# Patient Record
Sex: Male | Born: 1945 | Race: Black or African American | Hispanic: No | Marital: Married | Smoking: Former smoker
Health system: Southern US, Community
[De-identification: ages and names within clinical notes are randomized; demographics above are authoritative.]

## PROBLEM LIST (undated history)

## (undated) DIAGNOSIS — I10 Essential (primary) hypertension: Secondary | ICD-10-CM

## (undated) DIAGNOSIS — Z8619 Personal history of other infectious and parasitic diseases: Secondary | ICD-10-CM

## (undated) DIAGNOSIS — G20 Parkinson's disease: Secondary | ICD-10-CM

## (undated) DIAGNOSIS — N529 Male erectile dysfunction, unspecified: Secondary | ICD-10-CM

## (undated) DIAGNOSIS — K5900 Constipation, unspecified: Secondary | ICD-10-CM

## (undated) DIAGNOSIS — N400 Enlarged prostate without lower urinary tract symptoms: Secondary | ICD-10-CM

## (undated) HISTORY — DX: Personal history of other infectious and parasitic diseases: Z86.19

## (undated) HISTORY — PX: MOLE REMOVAL: SHX2046

## (undated) HISTORY — DX: Essential (primary) hypertension: I10

## (undated) HISTORY — DX: Parkinson's disease: G20

## (undated) HISTORY — DX: Male erectile dysfunction, unspecified: N52.9

---

## 2005-06-11 ENCOUNTER — Ambulatory Visit (HOSPITAL_COMMUNITY): Admission: RE | Admit: 2005-06-11 | Discharge: 2005-06-11 | Payer: Self-pay | Admitting: Family Medicine

## 2006-03-24 ENCOUNTER — Ambulatory Visit: Payer: Self-pay | Admitting: Family Medicine

## 2006-03-29 ENCOUNTER — Ambulatory Visit: Payer: Self-pay | Admitting: Family Medicine

## 2006-05-16 ENCOUNTER — Ambulatory Visit: Payer: Self-pay | Admitting: Family Medicine

## 2007-07-20 HISTORY — PX: OTHER SURGICAL HISTORY: SHX169

## 2009-08-05 ENCOUNTER — Emergency Department: Payer: Self-pay | Admitting: Emergency Medicine

## 2011-02-12 ENCOUNTER — Other Ambulatory Visit: Payer: Self-pay

## 2011-02-19 ENCOUNTER — Encounter: Payer: Self-pay | Admitting: Family Medicine

## 2011-06-18 ENCOUNTER — Encounter: Payer: Self-pay | Admitting: Family Medicine

## 2011-06-18 ENCOUNTER — Ambulatory Visit (INDEPENDENT_AMBULATORY_CARE_PROVIDER_SITE_OTHER): Payer: Self-pay | Admitting: Family Medicine

## 2011-06-18 VITALS — BP 142/92 | HR 72 | Temp 98.5°F | Ht 68.0 in | Wt 186.5 lb

## 2011-06-18 DIAGNOSIS — Z1211 Encounter for screening for malignant neoplasm of colon: Secondary | ICD-10-CM

## 2011-06-18 DIAGNOSIS — N529 Male erectile dysfunction, unspecified: Secondary | ICD-10-CM

## 2011-06-18 DIAGNOSIS — Z0000 Encounter for general adult medical examination without abnormal findings: Secondary | ICD-10-CM | POA: Insufficient documentation

## 2011-06-18 DIAGNOSIS — H938X9 Other specified disorders of ear, unspecified ear: Secondary | ICD-10-CM

## 2011-06-18 DIAGNOSIS — Z125 Encounter for screening for malignant neoplasm of prostate: Secondary | ICD-10-CM

## 2011-06-18 LAB — COMPREHENSIVE METABOLIC PANEL
ALT: 15 U/L (ref 0–53)
AST: 18 U/L (ref 0–37)
Albumin: 4 g/dL (ref 3.5–5.2)
Alkaline Phosphatase: 51 U/L (ref 39–117)
BUN: 15 mg/dL (ref 6–23)
Calcium: 9.4 mg/dL (ref 8.4–10.5)
Chloride: 106 mEq/L (ref 96–112)
GFR: 90.47 mL/min (ref >60.00)
Glucose, Bld: 107 mg/dL — ABNORMAL HIGH (ref 70–99)
Potassium: 4 mEq/L (ref 3.5–5.1)
Sodium: 140 mEq/L (ref 135–145)
Total Bilirubin: 1.3 mg/dL — ABNORMAL HIGH (ref 0.3–1.2)

## 2011-06-18 LAB — CBC WITH DIFFERENTIAL/PLATELET
Basophils Relative: 0.3 % (ref 0.0–3.0)
Eosinophils Absolute: 0.1 10*3/uL (ref 0.0–0.7)
Eosinophils Relative: 1 % (ref 0.0–5.0)
HCT: 42.4 % (ref 39.0–52.0)
Hemoglobin: 14 g/dL (ref 13.0–17.0)
Lymphocytes Relative: 39.6 % (ref 12.0–46.0)
Lymphs Abs: 2.3 10*3/uL (ref 0.7–4.0)
MCHC: 33.1 g/dL (ref 30.0–36.0)
MCV: 86.9 fl (ref 78.0–100.0)
Monocytes Absolute: 0.5 10*3/uL (ref 0.1–1.0)
Monocytes Relative: 7.8 % (ref 3.0–12.0)
Neutro Abs: 3 10*3/uL (ref 1.4–7.7)
Neutrophils Relative %: 51.3 % (ref 43.0–77.0)
RBC: 4.88 Mil/uL (ref 4.22–5.81)
WBC: 5.9 10*3/uL (ref 4.5–10.5)

## 2011-06-18 LAB — LIPID PANEL
Cholesterol: 183 mg/dL (ref 0–200)
HDL: 53.5 mg/dL (ref >39.00)
LDL Cholesterol: 115 mg/dL — ABNORMAL HIGH (ref 0–99)
Total CHOL/HDL Ratio: 3
VLDL: 14.6 mg/dL (ref 0.0–40.0)

## 2011-06-18 LAB — PSA: PSA: 2.51 ng/mL (ref 0.10–4.00)

## 2011-06-18 LAB — POC HEMOCCULT BLD/STL (OFFICE/1-CARD/DIAGNOSTIC): Fecal Occult Blood, POC: NEGATIVE

## 2011-06-18 MED ORDER — TADALAFIL 10 MG PO TABS: 10.0000 mg | ORAL_TABLET | Freq: Every day | ORAL | Status: AC | PRN

## 2011-06-18 NOTE — Assessment & Plan Note (Signed)
Preventative protocols reviewed and updated unless pt declined. Declines flu.  Last tetanus 2008. Would like stool kit for colon screening. PSA today.  DRE reassuring.

## 2011-06-18 NOTE — Assessment & Plan Note (Signed)
Requests refill of cialis.  Has taken and tolerated in past. Reviewed side effects, states familiar with them.

## 2011-06-18 NOTE — Progress Notes (Signed)
Subjective:    Patient ID: Brandon Manning, male    DOB: 04-Nov-1945, 65 y.o.   MRN: [id number]  HPI CC: re establish, would like CPE.  Presents with wife.  No concerns today.  Would like physical for new insurance purposes  Wants ear checked, may have cotton tip of qtip in right ear.  Happened about 1 mo ago.  Some muffled hearing on right side.  Having problems with sex - maintaining erection.  Drive ok.  cialis has worked better than viagra in past.  Would like refill.  Preventative: Last CPE 2007.  Had blood work done at work in past, states no problems. Last tetanus shot 2008. Declines flu shot. Colon screening - would like stool kit. Prostate screening - no prostate exam in past.  Would like today.  Nocturia x1, strong stream. Fasting today  Caffeine: occasional caffeine Lives with wife, 38yo son Occupation: works at [employer] Activity: walking at [employer], working outside Diet: fruits/vegetables daily, water, occasional red meat, fish 1x/wk  Medications and allergies reviewed and updated in chart.  Past histories reviewed and updated if relevant as below. Patient Active Problem List  Diagnoses  . Health maintenance examination   Past Medical History  Diagnosis Date  . History of chicken pox    No past surgical history on file. History  Substance Use Topics  . Smoking status: Never Smoker   . Smokeless tobacco: Never Used  . Alcohol Use: No   Family History  Problem Relation Age of Onset  . Kidney disease Mother   . Diabetes Sister   . Cancer Neg Hx   . Coronary artery disease Neg Hx   . Stroke Neg Hx   . Hyperlipidemia Neg Hx   . Hypertension Neg Hx    No Known Allergies No current outpatient prescriptions on file prior to visit.   Review of Systems  Constitutional: Negative for fever, chills, activity change, appetite change, fatigue and unexpected weight change.  HENT: Negative for hearing loss and neck pain.   Eyes: Negative for visual  disturbance.  Respiratory: Negative for cough, chest tightness, shortness of breath and wheezing.   Cardiovascular: Negative for chest pain, palpitations and leg swelling.  Gastrointestinal: Negative for nausea, vomiting, abdominal pain, diarrhea, constipation, blood in stool and abdominal distention.  Genitourinary: Negative for hematuria and difficulty urinating.  Musculoskeletal: Negative for myalgias and arthralgias.  Skin: Negative for rash.  Neurological: Negative for dizziness, seizures, syncope and headaches.  Hematological: Does not bruise/bleed easily.  Psychiatric/Behavioral: Negative for dysphoric mood. The patient is not nervous/anxious.        Objective:   Physical Exam  Nursing note and vitals reviewed. Constitutional: He is oriented to person, place, and time. He appears well-developed and well-nourished. No distress.  HENT:  Head: Normocephalic and atraumatic.  Right Ear: External ear normal.  Left Ear: Hearing, tympanic membrane, external ear and ear canal normal.  Nose: Nose normal. No mucosal edema or rhinorrhea.  Mouth/Throat: Uvula is midline, oropharynx is clear and moist and mucous membranes are normal. No oropharyngeal exudate, posterior oropharyngeal edema, posterior oropharyngeal erythema or tonsillar abscesses.       R ear canal with fibrous yellow dry foreign object deep canal next to TM Attempted removal with plastic curette, unable to remove.  Eyes: Conjunctivae and EOM are normal. Pupils are equal, round, and reactive to light. No scleral icterus.  Neck: Normal range of motion. Neck supple.  Cardiovascular: Normal rate, regular rhythm, normal heart sounds and intact  distal pulses.   No murmur heard. Pulses:      Radial pulses are 2+ on the right side, and 2+ on the left side.  Pulmonary/Chest: Effort normal and breath sounds normal. No respiratory distress. He has no wheezes. He has no rales.  Abdominal: Soft. Bowel sounds are normal. He exhibits no  distension and no mass. There is no tenderness. There is no rebound and no guarding.  Genitourinary: Rectum normal. Rectal exam shows no external hemorrhoid, no internal hemorrhoid, no fissure, no mass, no tenderness and anal tone normal. Guaiac negative stool. Prostate is enlarged (slight enlargement, 30gm). Prostate is not tender.       No irregularity, nodularity  Musculoskeletal: Normal range of motion. He exhibits no edema.  Lymphadenopathy:    He has no cervical adenopathy.  Neurological: He is alert and oriented to person, place, and time.       CN grossly intact, station and gait intact  Skin: Skin is warm and dry. No rash noted.  Psychiatric: He has a normal mood and affect. His behavior is normal. Judgment and thought content normal.      Assessment & Plan:

## 2011-06-18 NOTE — Assessment & Plan Note (Signed)
anticipate cotton tip in deep right ear canal.  Unable to remove. Refer to ENT.

## 2011-06-18 NOTE — Patient Instructions (Signed)
i've sent Cialis to your pharmacy Blood work today. Pass by Marion's office for referral to ENT to eval ear object. Good to meet you today, call us with questions.

## 2011-09-09 ENCOUNTER — Encounter: Payer: Self-pay | Admitting: Family Medicine

## 2011-09-09 ENCOUNTER — Ambulatory Visit (INDEPENDENT_AMBULATORY_CARE_PROVIDER_SITE_OTHER): Payer: Commercial Managed Care - PPO | Admitting: Family Medicine

## 2011-09-09 DIAGNOSIS — M25551 Pain in right hip: Secondary | ICD-10-CM | POA: Insufficient documentation

## 2011-09-09 DIAGNOSIS — M25559 Pain in unspecified hip: Secondary | ICD-10-CM

## 2011-09-09 DIAGNOSIS — I10 Essential (primary) hypertension: Secondary | ICD-10-CM

## 2011-09-09 DIAGNOSIS — H938X9 Other specified disorders of ear, unspecified ear: Secondary | ICD-10-CM

## 2011-09-09 DIAGNOSIS — N529 Male erectile dysfunction, unspecified: Secondary | ICD-10-CM

## 2011-09-09 DIAGNOSIS — J069 Acute upper respiratory infection, unspecified: Secondary | ICD-10-CM

## 2011-09-09 DIAGNOSIS — R030 Elevated blood-pressure reading, without diagnosis of hypertension: Secondary | ICD-10-CM

## 2011-09-09 HISTORY — DX: Essential (primary) hypertension: I10

## 2011-09-09 MED ORDER — NAPROXEN 500 MG PO TABS: ORAL_TABLET | ORAL | Status: DC

## 2011-09-09 MED ORDER — SILDENAFIL CITRATE 50 MG PO TABS: 50.0000 mg | ORAL_TABLET | Freq: Every day | ORAL | Status: DC | PRN
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
## 2011-09-09 NOTE — Assessment & Plan Note (Signed)
Request change from cialis to viagra.

## 2011-09-09 NOTE — Patient Instructions (Signed)
Push fluids and rest.  Keep eye on blood pressure at home - if consistently >140/90, please return to see me. For hip - I think you have greater trochanteric bursitis.  Take anti inflammatory for 5 days regularly then as needed (naprosyn).  Stretching exercises provided,  If not better let me know for referra lto Dr. Patsy Lager for possible injection. For sinuses - You have a sinus infection, but likely viral . Push fluids and plenty of rest. Nasal saline irrigation or neti pot to help drain sinuses. May use simple mucinex with plenty of fluid to help mobilize mucous. Let us know if fever >101.5, trouble opening/closing mouth, difficulty swallowing, or worsening - you may need to be seen again.  If symptoms worsening or going on past 10 days, call me for antibiotic.

## 2011-09-09 NOTE — Assessment & Plan Note (Signed)
Anticipate greater trochanteric bursitis on right- provided with stretching/strengthening exercises from SM pt advisor. Treat with naprosyn short course. If not improving, to notify me for referral to [facility] for consideration of injection.

## 2011-09-09 NOTE — Assessment & Plan Note (Signed)
Given short duration and clear lung exam, anticipate viral urti. supportive care as per instructions. Red flags to return discussed. If sxs going on past 10 days, they will call me for abx. If worsening, will update me.

## 2011-09-09 NOTE — Assessment & Plan Note (Signed)
BP Readings from Last 3 Encounters:  09/09/11 170/110  06/18/11 142/92   according to pt and wife, never had HTN issues.  Last visit bp was mildly elevated. State they have BP cuff at home and will keep eye on blood pressures.   Given so elevated this visit, asked them to return in 1 mo for recheck.

## 2011-09-09 NOTE — Assessment & Plan Note (Signed)
Seen by ENT, qtip removed from R ear canal.

## 2011-10-07 ENCOUNTER — Ambulatory Visit: Payer: Commercial Managed Care - PPO | Admitting: Family Medicine

## 2011-10-07 ENCOUNTER — Other Ambulatory Visit: Payer: Self-pay | Admitting: Family Medicine

## 2011-10-07 DIAGNOSIS — Z0289 Encounter for other administrative examinations: Secondary | ICD-10-CM

## 2011-10-07 MED ORDER — NAPROXEN 500 MG PO TABS: ORAL_TABLET | ORAL | Status: AC

## 2011-10-07 NOTE — Telephone Encounter (Signed)
Sent in.  I do want him to only take as needed, if needing to use regularly, would recommend eval by Dr. Salena Saner.

## 2011-10-07 NOTE — Telephone Encounter (Signed)
Will notify patient at appt today.

## 2011-10-07 NOTE — Telephone Encounter (Signed)
Pt is needing Naprosyn 500 mg for refill.

## 2012-03-13 ENCOUNTER — Emergency Department: Payer: Self-pay | Admitting: *Deleted

## 2012-09-28 ENCOUNTER — Other Ambulatory Visit: Payer: Self-pay | Admitting: Family Medicine

## 2012-09-28 DIAGNOSIS — Z125 Encounter for screening for malignant neoplasm of prostate: Secondary | ICD-10-CM

## 2012-09-29 ENCOUNTER — Other Ambulatory Visit: Payer: Commercial Managed Care - PPO

## 2012-10-06 ENCOUNTER — Encounter: Payer: Commercial Managed Care - PPO | Admitting: Family Medicine

## 2012-10-06 DIAGNOSIS — Z0289 Encounter for other administrative examinations: Secondary | ICD-10-CM

## 2012-11-03 ENCOUNTER — Ambulatory Visit (INDEPENDENT_AMBULATORY_CARE_PROVIDER_SITE_OTHER): Payer: Commercial Managed Care - PPO | Admitting: Family Medicine

## 2012-11-03 ENCOUNTER — Encounter: Payer: Self-pay | Admitting: Family Medicine

## 2012-11-03 VITALS — BP 176/120 | HR 80 | Temp 98.4°F | Ht 68.25 in | Wt 181.8 lb

## 2012-11-03 DIAGNOSIS — H833X9 Noise effects on inner ear, unspecified ear: Secondary | ICD-10-CM | POA: Insufficient documentation

## 2012-11-03 DIAGNOSIS — N401 Enlarged prostate with lower urinary tract symptoms: Secondary | ICD-10-CM

## 2012-11-03 DIAGNOSIS — Z1211 Encounter for screening for malignant neoplasm of colon: Secondary | ICD-10-CM

## 2012-11-03 DIAGNOSIS — Z23 Encounter for immunization: Secondary | ICD-10-CM

## 2012-11-03 DIAGNOSIS — R030 Elevated blood-pressure reading, without diagnosis of hypertension: Secondary | ICD-10-CM

## 2012-11-03 DIAGNOSIS — H833X3 Noise effects on inner ear, bilateral: Secondary | ICD-10-CM

## 2012-11-03 DIAGNOSIS — N138 Other obstructive and reflux uropathy: Secondary | ICD-10-CM

## 2012-11-03 DIAGNOSIS — N529 Male erectile dysfunction, unspecified: Secondary | ICD-10-CM

## 2012-11-03 DIAGNOSIS — Z125 Encounter for screening for malignant neoplasm of prostate: Secondary | ICD-10-CM

## 2012-11-03 DIAGNOSIS — Z0000 Encounter for general adult medical examination without abnormal findings: Secondary | ICD-10-CM

## 2012-11-03 DIAGNOSIS — N400 Enlarged prostate without lower urinary tract symptoms: Secondary | ICD-10-CM | POA: Insufficient documentation

## 2012-11-03 LAB — BASIC METABOLIC PANEL
BUN: 11 mg/dL (ref 6–23)
CO2: 29 mEq/L (ref 19–32)
Calcium: 9.8 mg/dL (ref 8.4–10.5)
Chloride: 104 mEq/L (ref 96–112)
Creatinine, Ser: 1 mg/dL (ref 0.4–1.5)
Glucose, Bld: 114 mg/dL — ABNORMAL HIGH (ref 70–99)
Potassium: 4 mEq/L (ref 3.5–5.1)

## 2012-11-03 LAB — PSA: PSA: 3 ng/mL (ref 0.10–4.00)

## 2012-11-03 LAB — TSH: TSH: 1.85 u[IU]/mL (ref 0.35–5.50)

## 2012-11-03 MED ORDER — SILDENAFIL CITRATE 50 MG PO TABS: 50.0000 mg | ORAL_TABLET | Freq: Every day | ORAL | Status: AC | PRN

## 2012-11-03 MED ORDER — AMLODIPINE BESYLATE 5 MG PO TABS: 5.0000 mg | ORAL_TABLET | Freq: Every day | ORAL | Status: DC

## 2012-11-03 MED ORDER — TAMSULOSIN HCL 0.4 MG PO CAPS: 0.4000 mg | ORAL_CAPSULE | Freq: Every day | ORAL | Status: DC

## 2012-11-03 NOTE — Patient Instructions (Addendum)
Pneumonia shot today. Call your insurance about the shingles shot to see if it is covered or how much it would cost and where is cheaper (here or pharmacy).  If you want to receive here, call for nurse visit. Pass by Marion's office today to schedule colonoscopy. Bring me a copy of living will so we can update your chart. Blood pressure is staying high - start amlodipine 5mg  daily.  Return to see me in 1-2 months for blood pressure follow up.  Keep track of blood pressures at home. Start enteric coated aspirin 81mg  every other day

## 2012-11-03 NOTE — Assessment & Plan Note (Signed)
Refilled viagra

## 2012-11-03 NOTE — Assessment & Plan Note (Addendum)
I'm actually not sure if pt truly has medicare so I have billed as regular physical  I have personally reviewed the Medicare Annual Wellness questionnaire and have noted 1. The patient's medical and social history 2. Their use of alcohol, tobacco or illicit drugs 3. Their current medications and supplements 4. The patient's functional ability including ADL's, fall risks, home safety risks and hearing or visual impairment. 5. Diet and physical activity 6. Evidence for depression or mood disorders The patients weight, height, BMI have been recorded in the chart.  Hearing and vision has been addressed. I have made referrals, counseling and provided education to the patient based review of the above and I have provided the pt with a written personalized care plan for preventive services. See scanned questionairre. Advanced directives discussed: I asked wife to bring me copy of advanced directives.  Reviewed preventative protocols and updated unless pt declined. Will refer for colonoscopy per pt/wife request. PSA/DRE today.  Blood work today. Recommended schedule vision exam as due.

## 2012-11-03 NOTE — Assessment & Plan Note (Signed)
Chronically elevated.  Did not return last year as requested for f/u. Will start amlodipine at 5mg  daily. Pt /wife agree.  States will buy cuff and monitor at home. Will need EKG at next visit. rtc 3 mo for f/u. Recommended start aspirin qod.

## 2012-11-03 NOTE — Progress Notes (Signed)
Subjective:    Patient ID: Brandon Manning, male    DOB: [birth year], 67 y.o.   MRN: [id number]  HPI CC: medicare wellness visit  Wife answers all questions 2/2 hearing loss.  States he has had medicare for over a year.  Elevated bp - elevated last several readings in office.  Does not check at home.  Wife says she could buy cuff. BP Readings from Last 3 Encounters:  11/03/12 176/120  09/09/11 170/110  06/18/11 142/92    Noticing increasing nocturia 5x and daytime frequency.  Denies dysuria, hematuria.  Some urgency.  No urinary accidents. Bowels regular.  Hearing screen failed today.  Longterm hearing trouble.  Lots of noise exposure.  Wife is planning on scheduling audiology evaluation in next few months. Vision screen - passed today. No falls in last year Denies depression, anhedonia, sadness.  Preventative:  Colon screening - would like colonoscopy as wife's family recently dx with colon cancer. Prostate screening - no prostate exam in past. Would like today. Nocturia x1, strong stream.  penumovax - to do today. Last tetanus shot 2008.  Shingles shot - discussed, wife will check with insurance. Advanced directives: has at home.  Wife would be medical decision maker  Caffeine: occasional caffeine  Lives with wife, 38yo son  Occupation: works at [employer]  Activity: walking at [employer], working outside  Diet: fruits/vegetables daily, water, occasional red meat, fish 1x/wk  Medications and allergies reviewed and updated in chart.  Past histories reviewed and updated if relevant as below. Patient Active Problem List  Diagnosis  . Health maintenance examination  . ED (erectile dysfunction)  . Right hip pain  . Viral URI with cough  . Elevated BP   Past Medical History  Diagnosis Date  . History of chicken pox   . ED (erectile dysfunction)    History reviewed. No pertinent past surgical history. History  Substance Use Topics  . Smoking status: Never  Smoker   . Smokeless tobacco: Never Used  . Alcohol Use: No   Family History  Problem Relation Age of Onset  . Kidney disease Mother   . Diabetes Sister   . Cancer Neg Hx   . Coronary artery disease Neg Hx   . Stroke Neg Hx   . Hyperlipidemia Neg Hx   . Hypertension Neg Hx    No Known Allergies No current outpatient prescriptions on file prior to visit.   No current facility-administered medications on file prior to visit.     Review of Systems  Constitutional: Negative for fever, chills, activity change, appetite change, fatigue and unexpected weight change.  HENT: Negative for hearing loss and neck pain.   Eyes: Negative for visual disturbance.  Respiratory: Negative for cough, chest tightness, shortness of breath and wheezing.   Cardiovascular: Negative for chest pain, palpitations and leg swelling.  Gastrointestinal: Negative for nausea, vomiting, abdominal pain, diarrhea, constipation, blood in stool and abdominal distention.  Genitourinary: Positive for frequency. Negative for hematuria and difficulty urinating.  Musculoskeletal: Negative for myalgias and arthralgias.  Skin: Negative for rash.  Neurological: Negative for dizziness, seizures, syncope and headaches.  Hematological: Does not bruise/bleed easily.  Psychiatric/Behavioral: Negative for dysphoric mood. The patient is not nervous/anxious.        Objective:   Physical Exam  Nursing note and vitals reviewed. Constitutional: He is oriented to person, place, and time. He appears well-developed and well-nourished. No distress.  HENT:  Head: Normocephalic and atraumatic.  Right Ear: Hearing, tympanic membrane, external  ear and ear canal normal.  Left Ear: Hearing, tympanic membrane, external ear and ear canal normal.  Nose: Nose normal.  Mouth/Throat: Oropharynx is clear and moist. No oropharyngeal exudate.  Eyes: Conjunctivae and EOM are normal. Pupils are equal, round, and reactive to light. No scleral  icterus.  cataracts  Neck: Normal range of motion. Neck supple. Carotid bruit is not present. No thyromegaly present.  Cardiovascular: Normal rate, regular rhythm, normal heart sounds and intact distal pulses.   No murmur heard. Pulses:      Radial pulses are 2+ on the right side, and 2+ on the left side.  Pulmonary/Chest: Effort normal and breath sounds normal. No respiratory distress. He has no wheezes. He has no rales.  Abdominal: Soft. Bowel sounds are normal. He exhibits no distension and no mass. There is no tenderness. There is no rebound and no guarding.  Genitourinary: Rectum normal. Rectal exam shows no external hemorrhoid, no internal hemorrhoid, no fissure, no mass, no tenderness and anal tone normal. Guaiac negative stool. Prostate is enlarged. Prostate is not tender.  Deep prostate  Musculoskeletal: Normal range of motion. He exhibits no edema.  Lymphadenopathy:    He has no cervical adenopathy.  Neurological: He is alert and oriented to person, place, and time.  CN grossly intact, station and gait intact  Skin: Skin is warm and dry. No rash noted.  Psychiatric: He has a normal mood and affect. His behavior is normal. Judgment and thought content normal.       Assessment & Plan:  \

## 2012-11-03 NOTE — Assessment & Plan Note (Signed)
Decline audiology referral today, wife states she will schedule when she can in next few months.

## 2012-11-03 NOTE — Assessment & Plan Note (Signed)
New dx, mildly elevated prostate on DRE today. Discussed this dx. Suggested check urine but pt left prior to this - will check at next visit. Start flomax at 0.4mg  daily.

## 2012-11-06 ENCOUNTER — Encounter: Payer: Self-pay | Admitting: *Deleted

## 2012-11-17 ENCOUNTER — Encounter: Payer: Self-pay | Admitting: Gastroenterology

## 2013-01-25 ENCOUNTER — Encounter: Payer: Commercial Managed Care - PPO | Admitting: Gastroenterology

## 2013-10-03 ENCOUNTER — Other Ambulatory Visit: Payer: Self-pay | Admitting: Family Medicine

## 2013-10-08 ENCOUNTER — Encounter (HOSPITAL_COMMUNITY): Payer: Self-pay | Admitting: Emergency Medicine

## 2013-10-08 DIAGNOSIS — K5900 Constipation, unspecified: Secondary | ICD-10-CM | POA: Insufficient documentation

## 2013-10-08 DIAGNOSIS — Z7982 Long term (current) use of aspirin: Secondary | ICD-10-CM | POA: Insufficient documentation

## 2013-10-08 DIAGNOSIS — Z87448 Personal history of other diseases of urinary system: Secondary | ICD-10-CM | POA: Insufficient documentation

## 2013-10-08 DIAGNOSIS — Z79899 Other long term (current) drug therapy: Secondary | ICD-10-CM | POA: Insufficient documentation

## 2013-10-08 DIAGNOSIS — Z8619 Personal history of other infectious and parasitic diseases: Secondary | ICD-10-CM | POA: Insufficient documentation

## 2013-10-08 NOTE — ED Notes (Signed)
Pt to ED c/o constipation.  Last BM 4 days ago.  Has used OTC meds without results.  Pt denies any pain.

## 2013-10-09 ENCOUNTER — Encounter (HOSPITAL_COMMUNITY): Payer: Self-pay | Admitting: Emergency Medicine

## 2013-10-09 ENCOUNTER — Emergency Department (HOSPITAL_COMMUNITY)
Admission: EM | Admit: 2013-10-09 | Discharge: 2013-10-09 | Disposition: A | Discharge status: Home / Self Care | Payer: Medicare Other | Attending: Emergency Medicine | Admitting: Emergency Medicine

## 2013-10-09 ENCOUNTER — Emergency Department (HOSPITAL_COMMUNITY): Payer: Medicare Other

## 2013-10-09 DIAGNOSIS — K5900 Constipation, unspecified: Secondary | ICD-10-CM

## 2013-10-09 HISTORY — DX: Constipation, unspecified: K59.00

## 2013-10-09 MED ORDER — FLEET ENEMA 7-19 GM/118ML RE ENEM
1.0000 | ENEMA | Freq: Once | RECTAL | Status: AC
  Administered 2013-10-09: 1 via RECTAL
  Filled 2013-10-09: qty 1

## 2013-10-09 NOTE — ED Provider Notes (Signed)
CSN: [id number]     Arrival date & time 10/08/13  2104 History   First MD Initiated Contact with Patient 10/09/13 0226     Chief Complaint  Patient presents with  . Constipation     (Consider location/radiation/quality/duration/timing/severity/associated sxs/prior Treatment) HPI 68 year old male presents to emergency department from home with complaint of 4 days of constipation.  Wife reports that they have tried Colace, MiraLAX, warm apple, and prune juice without improvement.  Patient reports mild abdominal pain and distention.  He has had previous problems with constipation. Past Medical History  Diagnosis Date  . History of chicken pox   . ED (erectile dysfunction)   . Constipation    History reviewed. No pertinent past surgical history. Family History  Problem Relation Age of Onset  . Kidney disease Mother   . Diabetes Sister   . Cancer Neg Hx   . Coronary artery disease Neg Hx   . Stroke Neg Hx   . Hyperlipidemia Neg Hx   . Hypertension Neg Hx    History  Substance Use Topics  . Smoking status: Never Smoker   . Smokeless tobacco: Never Used  . Alcohol Use: No    Review of Systems   See History of Present Illness; otherwise all other systems are reviewed and negative  Allergies  Review of patient's allergies indicates no known allergies.  Home Medications   Current Outpatient Rx  Name  Route  Sig  Dispense  Refill  . amLODipine (NORVASC) 5 MG tablet   Oral   Take 5 mg by mouth daily.         Marland Kitchen aspirin EC 81 MG tablet   Oral   Take 81 mg by mouth every other day.         . Multiple Vitamins-Minerals (MULTIVITAMIN PO)   Oral   Take 1 tablet by mouth daily.         . tamsulosin (FLOMAX) 0.4 MG CAPS capsule   Oral   Take 0.4 mg by mouth daily. Take one capsule by mouth once daily **NEEDS PHYSICAL FOR FURTHER REFILLS**          BP 163/89  Pulse 73  Temp(Src) 98.3 F (36.8 C) (Oral)  Resp 16  Wt 185 lb (83.915 kg)  SpO2 99% Physical Exam   Nursing note and vitals reviewed. Constitutional: He is oriented to person, place, and time. He appears well-developed and well-nourished.  HENT:  Head: Normocephalic and atraumatic.  Right Ear: External ear normal.  Left Ear: External ear normal.  Nose: Nose normal.  Mouth/Throat: Oropharynx is clear and moist.  Eyes: Conjunctivae and EOM are normal. Pupils are equal, round, and reactive to light.  Neck: Normal range of motion. Neck supple. No JVD present. No tracheal deviation present. No thyromegaly present.  Cardiovascular: Normal rate, regular rhythm, normal heart sounds and intact distal pulses.  Exam reveals no gallop and no friction rub.   No murmur heard. Pulmonary/Chest: Effort normal and breath sounds normal. No stridor. No respiratory distress. He has no wheezes. He has no rales. He exhibits no tenderness.  Abdominal: Soft. Bowel sounds are normal. He exhibits no distension and no mass. There is tenderness (mild lower). There is no rebound and no guarding.  Genitourinary:  Rectal exam completed, patient with a large amount of soft.  Stool on rectal exam, normal prostate.  Unable to completely disimpact feces load.  Musculoskeletal: Normal range of motion. He exhibits no edema and no tenderness.  Lymphadenopathy:    He  has no cervical adenopathy.  Neurological: He is alert and oriented to person, place, and time. He exhibits normal muscle tone. Coordination normal.  Skin: Skin is warm and dry. No rash noted. No erythema. No pallor.  Psychiatric: He has a normal mood and affect. His behavior is normal. Judgment and thought content normal.    ED Course  Procedures (including critical care time) Labs Review Labs Reviewed - No data to display Imaging Review Dg Abd 1 View  10/09/2013   CLINICAL DATA:  Constipation.  EXAM: ABDOMEN - 1 VIEW  COMPARISON:  None available for comparison at time of study interpretation.  FINDINGS: The bowel gas pattern is normal. No radio-opaque  calculi or other significant radiographic abnormality are seen. Degenerative change of the lumbar spine. Phleboliths in the pelvis.  IMPRESSION: Nonspecific bowel gas pattern.   Electronically Signed   By: Elon Alas   On: 10/09/2013 02:26     EKG Interpretation None      MDM   Final diagnoses:  Constipation    68 year old male with reported of constipation.  After receiving an enema here, he reports good relief with 4 large bowel movements.  Patient and wife advised he may have some diarrhea given the laxatives that he has been taken over the last several days.    Kalman Drape, MD 10/10/13 [phone]

## 2013-10-09 NOTE — Discharge Instructions (Signed)
Constipation, Adult Constipation is when a person has fewer than 3 bowel movements a week; has difficulty having a bowel movement; or has stools that are dry, hard, or larger than normal. As people grow older, constipation is more common. If you try to fix constipation with medicines that make you have a bowel movement (laxatives), the problem may get worse. Long-term laxative use may cause the muscles of the colon to become weak. A low-fiber diet, not taking in enough fluids, and taking certain medicines may make constipation worse. CAUSES   Certain medicines, such as antidepressants, pain medicine, iron supplements, antacids, and water pills.   Certain diseases, such as diabetes, irritable bowel syndrome (IBS), thyroid disease, or depression.   Not drinking enough water.   Not eating enough fiber-rich foods.   Stress or travel.  Lack of physical activity or exercise.  Not going to the restroom when there is the urge to have a bowel movement.  Ignoring the urge to have a bowel movement.  Using laxatives too much. SYMPTOMS   Having fewer than 3 bowel movements a week.   Straining to have a bowel movement.   Having hard, dry, or larger than normal stools.   Feeling full or bloated.   Pain in the lower abdomen.  Not feeling relief after having a bowel movement. DIAGNOSIS  Your caregiver will take a medical history and perform a physical exam. Further testing may be done for severe constipation. Some tests may include:   A barium enema X-ray to examine your rectum, colon, and sometimes, your small intestine.  A sigmoidoscopy to examine your lower colon.  A colonoscopy to examine your entire colon. TREATMENT  Treatment will depend on the severity of your constipation and what is causing it. Some dietary treatments include drinking more fluids and eating more fiber-rich foods. Lifestyle treatments may include regular exercise. If these diet and lifestyle recommendations  do not help, your caregiver may recommend taking over-the-counter laxative medicines to help you have bowel movements. Prescription medicines may be prescribed if over-the-counter medicines do not work.  HOME CARE INSTRUCTIONS   Increase dietary fiber in your diet, such as fruits, vegetables, whole grains, and beans. Limit high-fat and processed sugars in your diet, such as [place] fries, hamburgers, cookies, candies, and soda.   A fiber supplement may be added to your diet if you cannot get enough fiber from foods.   Drink enough fluids to keep your urine clear or pale yellow.   Exercise regularly or as directed by your caregiver.   Go to the restroom when you have the urge to go. Do not hold it.  Only take medicines as directed by your caregiver. Do not take other medicines for constipation without talking to your caregiver first. [place] IF:   You have bright red blood in your stool.   Your constipation lasts for more than 4 days or gets worse.   You have abdominal or rectal pain.   You have thin, pencil-like stools.  You have unexplained weight loss. MAKE SURE YOU:   Understand these instructions.  Will watch your condition.  Will get help right away if you are not doing well or get worse. Document Released: 04/02/2004 Document Revised: 09/27/2011 Document Reviewed: 04/16/2013 [facility] Patient Information 2014 [place], [place].  Fiber Content in Foods Drinking plenty of fluids and consuming foods high in fiber can help with constipation. See the list below for the fiber content of some common foods. Starches and Grains / Dietary  Fiber (g)  Cheerios, 1 cup / 3 g  Kellogg's Corn Flakes, 1 cup / 0.7 g  Rice Krispies, 1  cup / 0.3 g  Quaker Oat Life Cereal,  cup / 2.1 g  Oatmeal, instant (cooked),  cup / 2 g  Kellogg's Frosted Mini Wheats, 1 cup / 5.1 g  Rice, brown, long-grain (cooked), 1 cup / 3.5 g  Rice, white, long-grain (cooked),  1 cup / 0.6 g  Macaroni, cooked, enriched, 1 cup / 2.5 g Legumes / Dietary Fiber (g)  Beans, baked, canned, plain or vegetarian,  cup / 5.2 g  Beans, kidney, canned,  cup / 6.8 g  Beans, pinto, dried (cooked),  cup / 7.7 g  Beans, pinto, canned,  cup / 5.5 g Breads and Crackers / Dietary Fiber (g)  Graham crackers, plain or honey, 2 squares / 0.7 g  Saltine crackers, 3 squares / 0.3 g  Pretzels, plain, salted, 10 pieces / 1.8 g  Bread, whole-wheat, 1 slice / 1.9 g  Bread, white, 1 slice / 0.7 g  Bread, raisin, 1 slice / 1.2 g  Bagel, plain, 3 oz / 2 g  Tortilla, flour, 1 oz / 0.9 g  Tortilla, corn, 1 small / 1.5 g  Bun, hamburger or hotdog, 1 small / 0.9 g Fruits / Dietary Fiber (g)  Apple, raw with skin, 1 medium / 4.4 g  Applesauce, sweetened,  cup / 1.5 g  Banana,  medium / 1.5 g  Grapes, 10 grapes / 0.4 g  Orange, 1 small / 2.3 g  Raisin, 1.5 oz / 1.6 g  Melon, 1 cup / 1.4 g Vegetables / Dietary Fiber (g)  Green beans, canned,  cup / 1.3 g  Carrots (cooked),  cup / 2.3 g  Broccoli (cooked),  cup / 2.8 g  Peas, frozen (cooked),  cup / 4.4 g  Potatoes, mashed,  cup / 1.6 g  Lettuce, 1 cup / 0.5 g  Corn, canned,  cup / 1.6 g  Tomato,  cup / 1.1 g Document Released: 11/21/2006 Document Revised: 09/27/2011 Document Reviewed: 01/16/2007 [employer] Patient Information 2014 [place], [place].

## 2013-10-09 NOTE — ED Notes (Signed)
Patient transported to X-ray 

## 2013-10-09 NOTE — ED Notes (Signed)
Fleets Enema administered.

## 2013-11-02 ENCOUNTER — Other Ambulatory Visit: Payer: Self-pay | Admitting: *Deleted

## 2013-11-02 MED ORDER — TAMSULOSIN HCL 0.4 MG PO CAPS: 0.4000 mg | ORAL_CAPSULE | Freq: Every day | ORAL | Status: DC

## 2014-02-01 ENCOUNTER — Ambulatory Visit (INDEPENDENT_AMBULATORY_CARE_PROVIDER_SITE_OTHER): Payer: Medicare Other | Admitting: Family Medicine

## 2014-02-01 ENCOUNTER — Encounter: Payer: Self-pay | Admitting: Family Medicine

## 2014-02-01 VITALS — BP 144/84 | HR 76 | Temp 98.3°F | Wt 176.0 lb

## 2014-02-01 DIAGNOSIS — G20 Parkinson's disease: Secondary | ICD-10-CM | POA: Insufficient documentation

## 2014-02-01 DIAGNOSIS — R0609 Other forms of dyspnea: Secondary | ICD-10-CM

## 2014-02-01 DIAGNOSIS — I10 Essential (primary) hypertension: Secondary | ICD-10-CM

## 2014-02-01 DIAGNOSIS — G20A1 Parkinson's disease without dyskinesia, without mention of fluctuations: Secondary | ICD-10-CM

## 2014-02-01 DIAGNOSIS — N138 Other obstructive and reflux uropathy: Secondary | ICD-10-CM

## 2014-02-01 DIAGNOSIS — N401 Enlarged prostate with lower urinary tract symptoms: Secondary | ICD-10-CM

## 2014-02-01 DIAGNOSIS — M2560 Stiffness of unspecified joint, not elsewhere classified: Secondary | ICD-10-CM

## 2014-02-01 DIAGNOSIS — R0989 Other specified symptoms and signs involving the circulatory and respiratory systems: Secondary | ICD-10-CM

## 2014-02-01 HISTORY — DX: Parkinson's disease: G20

## 2014-02-01 HISTORY — DX: Parkinson's disease without dyskinesia, without mention of fluctuations: G20.A1

## 2014-02-01 MED ORDER — TAMSULOSIN HCL 0.4 MG PO CAPS: 0.4000 mg | ORAL_CAPSULE | Freq: Every day | ORAL | Status: DC

## 2014-02-01 MED ORDER — AMLODIPINE BESYLATE 5 MG PO TABS: 5.0000 mg | ORAL_TABLET | Freq: Every day | ORAL | Status: DC

## 2014-02-01 NOTE — Assessment & Plan Note (Signed)
Chronic, stable. Continue amlodipine 5mg  daily. Refilled today. Orthostatics negative today in office.

## 2014-02-01 NOTE — Progress Notes (Signed)
Pre visit review using our clinic review tool, if applicable. No additional management support is needed unless otherwise documented below in the visit note. 

## 2014-02-01 NOTE — Assessment & Plan Note (Signed)
Generalized stiffness noted today ?parkinsonisms but no memory deficits endorsed. Pt endorses slight instability on feet and occasional tremor, wife has not noticed any trouble with this. Will continue to monitor.

## 2014-02-01 NOTE — Assessment & Plan Note (Addendum)
Predominantly with heat.  Lungs and heart normal today. Will continue to monitor for now, discussed slowed transitions from sitting to standing. Handicap application provided today.

## 2014-02-01 NOTE — Assessment & Plan Note (Signed)
Stable on flomax - denies LUTsxs. Continue flomax - refilled today. Discussed stopping med if any eye procedure planned.

## 2014-02-01 NOTE — Patient Instructions (Signed)
Good to see you today, call us with questions. Blood pressure laying down then standing today. Make sure to stay well hydrated. I've refilled medications today. Handicap placard provided today.
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
## 2014-08-01 ENCOUNTER — Ambulatory Visit (INDEPENDENT_AMBULATORY_CARE_PROVIDER_SITE_OTHER)
Admission: RE | Admit: 2014-08-01 | Discharge: 2014-08-01 | Disposition: A | Discharge status: Home / Self Care | Payer: Medicare Other | Source: Ambulatory Visit | Attending: Family Medicine | Admitting: Family Medicine

## 2014-08-01 ENCOUNTER — Ambulatory Visit (INDEPENDENT_AMBULATORY_CARE_PROVIDER_SITE_OTHER): Payer: Medicare Other | Admitting: Family Medicine

## 2014-08-01 ENCOUNTER — Encounter: Payer: Self-pay | Admitting: Family Medicine

## 2014-08-01 VITALS — BP 140/90 | HR 80 | Temp 98.4°F | Wt 185.8 lb

## 2014-08-01 DIAGNOSIS — M2560 Stiffness of unspecified joint, not elsewhere classified: Secondary | ICD-10-CM

## 2014-08-01 DIAGNOSIS — M545 Low back pain, unspecified: Secondary | ICD-10-CM | POA: Insufficient documentation

## 2014-08-01 MED ORDER — NAPROXEN 500 MG PO TABS: 500.0000 mg | ORAL_TABLET | Freq: Two times a day (BID) | ORAL | Status: DC | PRN

## 2014-08-01 NOTE — Progress Notes (Addendum)
BP 140/90 mmHg  Pulse 80  Temp(Src) 98.4 F (36.9 C) (Oral)  Wt 185 lb 12 oz (84.256 kg)   CC: back pain  Subjective:    Patient ID: Brandon Manning, male    DOB: Apr 02, 1946, 69 y.o.   MRN: [id number]  HPI: Brandon Manning is a 69 y.o. male presenting on 08/01/2014 for Back Pain   4 month h/o bilateral lower back pain around waist, worsening recently. Feels like "catch" when bending over.  No radiculopathy, fevers/chills, no bowel/bladder accidents . Some R anterior leg numbness that improves with standing and walking.  So far has tried tylenol which doesn't really help.  Denies inciting trauma/falls. Worse with rain. No h/o cancer.  Relevant past medical, surgical, family and social history reviewed and updated as indicated. Interim medical history since our last visit reviewed. Allergies and medications reviewed and updated. Current Outpatient Prescriptions on File Prior to Visit  Medication Sig  . amLODipine (NORVASC) 5 MG tablet Take 1 tablet (5 mg total) by mouth daily.  Marland Kitchen aspirin EC 81 MG tablet Take 81 mg by mouth every other day.  . Multiple Vitamins-Minerals (MULTIVITAMIN PO) Take 1 tablet by mouth daily.  . tamsulosin (FLOMAX) 0.4 MG CAPS capsule Take 1 capsule (0.4 mg total) by mouth daily. Take one capsule by mouth once daily   No current facility-administered medications on file prior to visit.    Review of Systems Per HPI unless specifically indicated above     Objective:    BP 140/90 mmHg  Pulse 80  Temp(Src) 98.4 F (36.9 C) (Oral)  Wt 185 lb 12 oz (84.256 kg)  Wt Readings from Last 3 Encounters:  08/01/14 185 lb 12 oz (84.256 kg)  02/01/14 176 lb (79.833 kg)  10/08/13 185 lb (83.915 kg)    Physical Exam  Constitutional: He is oriented to person, place, and time. He appears well-developed and well-nourished. No distress.  Musculoskeletal: He exhibits no edema.  No pain midline spine No paraspinous mm tenderness Neg SLR bilaterally. No pain with  int/ext rotation at hip. Neg FABER. No pain at SIJ, GTB or sciatic notch bilaterally. Unable to reproduce pain today. 2+ DP on right.  Neurological: He is alert and oriented to person, place, and time. He has normal strength. No sensory deficit.  5/5 strength BLE Sensation intact to light touch and monofilament. No cogwheel rigidity, no tremor.  Skin: Skin is warm and dry. No rash noted.  Nursing note and vitals reviewed.  Results for orders placed or performed in visit on 11/03/12  PSA  Result Value Ref Range   PSA 3.00 0.10 - 4.00 ng/mL  Basic metabolic panel  Result Value Ref Range   Sodium 140 135 - 145 mEq/L   Potassium 4.0 3.5 - 5.1 mEq/L   Chloride 104 96 - 112 mEq/L   CO2 29 19 - 32 mEq/L   Glucose, Bld 114 (H) 70 - 99 mg/dL   BUN 11 6 - 23 mg/dL   Creatinine, Ser 1.0 0.4 - 1.5 mg/dL   Calcium 9.8 8.4 - 10.5 mg/dL   GFR 97.57 >60.00 mL/min  TSH  Result Value Ref Range   TSH 1.85 0.35 - 5.50 uIU/mL      Assessment & Plan:   Problem List Items Addressed This Visit    Stiffness in joint    Generalized stiffness with some masked fascies. HOH. Wife denies memory trouble, significant tremor. ?parkinsonism. Continue to monitor.      Bilateral low back pain without  sciatica - Primary    Anticipate lumbar DDD related pain - given longevity of sxs will check baseline lumbar films. Discussed regular walking, stretching exercises, and OTC tylenol/NSAID use.  Wife requests stronger med - will send naprosyn 500mg  bid to take prn moderate pain. Discussed use sparingly and possible GI irritation on med. Update if not improving as expected.      Relevant Medications   naproxen (NAPROSYN) tablet   Other Relevant Orders   DG Lumbar Spine Complete       Follow up plan: Return if symptoms worsen or fail to improve.

## 2014-08-01 NOTE — Progress Notes (Signed)
Pre visit review using our clinic review tool, if applicable. No additional management support is needed unless otherwise documented below in the visit note. 

## 2014-08-01 NOTE — Patient Instructions (Signed)
I think this is coming from lower back arthritis. May treat with prescription strength naprosyn twice daily with food as needed. Ok to take tylenol with this but don't take with aleve May also try strengthening exercises provided today for lower back pain. I also encourage regular walking to keep back strong. xrays of lower back today. Let us know if not improved with this.

## 2014-08-01 NOTE — Assessment & Plan Note (Signed)
Generalized stiffness with some masked fascies. HOH. Wife denies memory trouble, significant tremor. ?parkinsonism. Continue to monitor.

## 2014-08-01 NOTE — Assessment & Plan Note (Signed)
Anticipate lumbar DDD related pain - given longevity of sxs will check baseline lumbar films. Discussed regular walking, stretching exercises, and OTC tylenol/NSAID use.  Wife requests stronger med - will send naprosyn 500mg  bid to take prn moderate pain. Discussed use sparingly and possible GI irritation on med. Update if not improving as expected.

## 2014-08-02 ENCOUNTER — Ambulatory Visit: Payer: Medicare Other | Admitting: Family Medicine

## 2014-09-06 ENCOUNTER — Other Ambulatory Visit: Payer: Self-pay | Admitting: Family Medicine

## 2014-09-06 NOTE — Telephone Encounter (Signed)
Ok to refill 

## 2014-10-24 ENCOUNTER — Other Ambulatory Visit: Payer: Self-pay | Admitting: Family Medicine

## 2014-11-01 ENCOUNTER — Ambulatory Visit (INDEPENDENT_AMBULATORY_CARE_PROVIDER_SITE_OTHER): Payer: Medicare Other | Admitting: Family Medicine

## 2014-11-01 ENCOUNTER — Encounter: Payer: Self-pay | Admitting: Family Medicine

## 2014-11-01 VITALS — BP 143/88 | HR 80 | Temp 98.1°F | Wt 188.2 lb

## 2014-11-01 DIAGNOSIS — M25473 Effusion, unspecified ankle: Secondary | ICD-10-CM | POA: Insufficient documentation

## 2014-11-01 DIAGNOSIS — I10 Essential (primary) hypertension: Secondary | ICD-10-CM

## 2014-11-01 DIAGNOSIS — M545 Low back pain, unspecified: Secondary | ICD-10-CM

## 2014-11-01 DIAGNOSIS — R609 Edema, unspecified: Secondary | ICD-10-CM

## 2014-11-01 MED ORDER — HYDROCHLOROTHIAZIDE 12.5 MG PO CAPS: 12.5000 mg | ORAL_CAPSULE | Freq: Every day | ORAL | Status: DC
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
## 2014-11-01 NOTE — Progress Notes (Signed)
Pre visit review using our clinic review tool, if applicable. No additional management support is needed unless otherwise documented below in the visit note. 

## 2014-11-01 NOTE — Assessment & Plan Note (Signed)
Chronic, but amlodipine likely causing pedal edema. Will change to hctz 12.5mg  daily. Return in 1.5 wks to check Cr and K.

## 2014-11-01 NOTE — Patient Instructions (Signed)
Back brace prescription provided today. For ankle swelling - I think this is coming from amlodipine.  Stop this medicine, take hydrochlorothiazide 12.5mg  daily instead. Return in 1.5 weeks to check potassium levels. Return over next few months for medicare wellness visit, prior fasting for labs

## 2014-11-01 NOTE — Assessment & Plan Note (Signed)
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 Provided with Rx for lower back brace with lumbar support to fill at local durable medical equipment store.

## 2014-11-01 NOTE — Assessment & Plan Note (Signed)
Lungs clear. Anticipate amlodipine side effect - will treat with change from amlodipine to hctz as antihypertensive regimen. Pt and wife agree with plan.

## 2014-11-15 ENCOUNTER — Ambulatory Visit: Payer: Medicare Other

## 2014-11-15 ENCOUNTER — Ambulatory Visit: Payer: Medicare Other | Admitting: Family Medicine

## 2014-11-15 ENCOUNTER — Encounter: Payer: Self-pay | Admitting: Family Medicine

## 2014-11-15 ENCOUNTER — Ambulatory Visit (INDEPENDENT_AMBULATORY_CARE_PROVIDER_SITE_OTHER): Payer: Medicare Other | Admitting: Family Medicine

## 2014-11-15 VITALS — BP 118/84 | HR 66 | Temp 97.9°F | Wt 188.1 lb

## 2014-11-15 DIAGNOSIS — R609 Edema, unspecified: Secondary | ICD-10-CM

## 2014-11-15 DIAGNOSIS — M25473 Effusion, unspecified ankle: Secondary | ICD-10-CM

## 2014-11-15 DIAGNOSIS — I10 Essential (primary) hypertension: Secondary | ICD-10-CM

## 2014-11-15 LAB — BASIC METABOLIC PANEL
BUN: 17 mg/dL (ref 6–23)
CO2: 29 mEq/L (ref 19–32)
CREATININE: 1.02 mg/dL (ref 0.50–1.35)
Calcium: 9.7 mg/dL (ref 8.4–10.5)
Chloride: 101 mEq/L (ref 96–112)
Glucose, Bld: 95 mg/dL (ref 70–99)
Potassium: 3.5 mEq/L (ref 3.5–5.3)
Sodium: 139 mEq/L (ref 135–145)

## 2014-11-15 MED ORDER — HYDROCHLOROTHIAZIDE 12.5 MG PO CAPS: 12.5000 mg | ORAL_CAPSULE | Freq: Every day | ORAL | Status: DC

## 2014-11-15 NOTE — Progress Notes (Signed)
BP 118/84 mmHg  Pulse 66  Temp(Src) 97.9 F (36.6 C) (Oral)  Wt 188 lb 1.9 oz (85.331 kg)   CC: med f/u visit  Subjective:    Patient ID: Brandon Manning, male    DOB: [birth year], 69 y.o.   MRN: [id number]  HPI: Brandon Manning is a 69 y.o. male presenting on 11/15/2014 for Follow-up   Amlodipine may have caused ankle swelling so this was stopped, and hctz 12.5mg  daily was started 2 wks ago. Was here today for lab visit recheck but placed in office visit.  Has noticed increased voiding but tolerating new med very well.  Relevant past medical, surgical, family and social history reviewed and updated as indicated. Interim medical history since our last visit reviewed. Allergies and medications reviewed and updated. Current Outpatient Prescriptions on File Prior to Visit  Medication Sig  . aspirin EC 81 MG tablet Take 81 mg by mouth every other day.  . Multiple Vitamins-Minerals (MULTIVITAMIN PO) Take 1 tablet by mouth daily.  . naproxen (NAPROSYN) 500 MG tablet TAKE ONE TABLET BY MOUTH TWICE DAILY AS NEEDED FOR MODERATE PAIN WITH FOOD. USE SPARINGLY.  Marland Kitchen tamsulosin (FLOMAX) 0.4 MG CAPS capsule Take 1 capsule (0.4 mg total) by mouth daily. Take one capsule by mouth once daily   No current facility-administered medications on file prior to visit.    Review of Systems Per HPI unless specifically indicated above     Objective:    BP 118/84 mmHg  Pulse 66  Temp(Src) 97.9 F (36.6 C) (Oral)  Wt 188 lb 1.9 oz (85.331 kg)  Wt Readings from Last 3 Encounters:  11/15/14 188 lb 1.9 oz (85.331 kg)  11/01/14 188 lb 4 oz (85.39 kg)  08/01/14 185 lb 12 oz (84.256 kg)    Physical Exam  Constitutional: He appears well-developed and well-nourished. No distress.  HENT:  Mouth/Throat: Oropharynx is clear and moist. No oropharyngeal exudate.  Cardiovascular: Normal rate, regular rhythm, normal heart sounds and intact distal pulses.   No murmur heard. Pulmonary/Chest: Effort normal and breath  sounds normal. No respiratory distress. He has no wheezes. He has no rales.  Musculoskeletal: He exhibits edema (tr pedal edema).  Skin: Skin is warm and dry. No rash noted.  Nursing note and vitals reviewed.  Results for orders placed or performed in visit on 11/03/12  PSA  Result Value Ref Range   PSA 3.00 0.10 - 4.00 ng/mL  Basic metabolic panel  Result Value Ref Range   Sodium 140 135 - 145 mEq/L   Potassium 4.0 3.5 - 5.1 mEq/L   Chloride 104 96 - 112 mEq/L   CO2 29 19 - 32 mEq/L   Glucose, Bld 114 (H) 70 - 99 mg/dL   BUN 11 6 - 23 mg/dL   Creatinine, Ser 1.0 0.4 - 1.5 mg/dL   Calcium 9.8 8.4 - 10.5 mg/dL   GFR 97.57 >60.00 mL/min  TSH  Result Value Ref Range   TSH 1.85 0.35 - 5.50 uIU/mL      Assessment & Plan:   Problem List Items Addressed This Visit    HTN (hypertension) - Primary    Chronic, actually much better on hctz 12.5mg  daily. Continue med. Check labs today after 2 wks hctz.      Relevant Medications   hydrochlorothiazide (MICROZIDE) 12.5 MG capsule   Ankle edema    Improved off amlodipine and on hctz. Continue hctz.          Follow up plan: Return as needed.

## 2014-11-15 NOTE — Assessment & Plan Note (Addendum)
Chronic, actually much better on hctz 12.5mg  daily. Continue med. Check labs today after 2 wks hctz.

## 2014-11-15 NOTE — Patient Instructions (Signed)
Blood pressure is looking great, leg swelling is better. Continue hydrochlorothiazide 12.5mg  daily. labwork today.

## 2014-11-15 NOTE — Assessment & Plan Note (Signed)
Improved off amlodipine and on hctz. Continue hctz.

## 2014-11-15 NOTE — Progress Notes (Signed)
Pre visit review using our clinic review tool, if applicable. No additional management support is needed unless otherwise documented below in the visit note. 

## 2014-11-15 NOTE — Addendum Note (Signed)
Addended by: Ellamae Sia on: 11/15/2014 04:37 PM   Modules accepted: Orders

## 2014-11-18 ENCOUNTER — Encounter: Payer: Self-pay | Admitting: *Deleted

## 2014-12-12 ENCOUNTER — Other Ambulatory Visit: Payer: Self-pay | Admitting: Family Medicine

## 2015-01-24 ENCOUNTER — Other Ambulatory Visit: Payer: Self-pay | Admitting: Family Medicine

## 2015-02-05 ENCOUNTER — Other Ambulatory Visit: Payer: Self-pay | Admitting: Family Medicine

## 2015-02-06 ENCOUNTER — Other Ambulatory Visit: Payer: Self-pay | Admitting: Family Medicine

## 2015-02-11 ENCOUNTER — Telehealth: Payer: Self-pay

## 2015-02-11 NOTE — Telephone Encounter (Signed)
Patients wife notified

## 2015-02-11 NOTE — Telephone Encounter (Signed)
Per Dr. Darnell Level, stop amlodipine and start HCTZ.

## 2015-02-11 NOTE — Telephone Encounter (Signed)
Mrs Schara called to see if pt was supposed to be taking amlodipine. Advised per 2 visits 10/2014 pt was to stop amlodipine and start HCTZ. Mrs Cunnington voiced understanding but wants this verified by MD. Mrs Wynetta Emery request cb.

## 2015-02-27 ENCOUNTER — Other Ambulatory Visit: Payer: Self-pay | Admitting: Family Medicine

## 2015-03-03 ENCOUNTER — Other Ambulatory Visit: Payer: Self-pay | Admitting: Family Medicine

## 2015-04-22 ENCOUNTER — Other Ambulatory Visit: Payer: Self-pay | Admitting: Family Medicine

## 2015-05-05 ENCOUNTER — Encounter: Payer: Self-pay | Admitting: Family Medicine

## 2015-05-05 ENCOUNTER — Ambulatory Visit (INDEPENDENT_AMBULATORY_CARE_PROVIDER_SITE_OTHER): Payer: Medicare Other | Admitting: Family Medicine

## 2015-05-05 VITALS — BP 130/94 | HR 80 | Temp 98.1°F | Wt 185.5 lb

## 2015-05-05 DIAGNOSIS — M545 Low back pain, unspecified: Secondary | ICD-10-CM

## 2015-05-05 DIAGNOSIS — R2689 Other abnormalities of gait and mobility: Secondary | ICD-10-CM

## 2015-05-05 DIAGNOSIS — G20 Parkinson's disease: Secondary | ICD-10-CM | POA: Diagnosis not present

## 2015-05-05 MED ORDER — HYDROCODONE-ACETAMINOPHEN 5-325 MG PO TABS: 0.5000 | ORAL_TABLET | Freq: Two times a day (BID) | ORAL | Status: DC | PRN

## 2015-05-05 NOTE — Assessment & Plan Note (Signed)
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  Ok to trial 1/2 tab hydrocodone 5/325 prn back pain as wife states this significantly helped when he was prescribed a few years ago, also provided with exercises from [facility] pt advisor on lower back pain Discussed extensively controlled substance use including addiction/abuse potential, dependence/tolerance potential, and need to monitor for common side effects including constipation, nausea, delayed mentation, and possible increased balance/fall risk.

## 2015-05-05 NOTE — Assessment & Plan Note (Signed)
Discussed concern for this diagnosis - due to generalized stiffness, hypokinesia and masked fasces. Offered neurology referral. Wife prefers to try pain med/exercises first with close f/u here and then decide on neurologist next month. Discussed possibility of meds improving several sxs.

## 2015-05-05 NOTE — Patient Instructions (Addendum)
Return for wellness visit - ok to schedule 12:45pm on 06/05/2015 for 30 min appt. Trial vicodin for back pain - 1/2 tablet at a time. Prescription printed out today Try rollator walker - Rx written out today. Do back exercises provided today. For small erosion on back - cover with vaseline and daily bandaid change. We will watch slowed movement/stiffness noted today and further discuss options next time.

## 2015-05-05 NOTE — Progress Notes (Signed)
Pre visit review using our clinic review tool, if applicable. No additional management support is needed unless otherwise documented below in the visit note. 
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
## 2015-05-05 NOTE — Assessment & Plan Note (Signed)
New - see above. Prescribed rollator with seat.

## 2015-06-03 ENCOUNTER — Encounter: Payer: Self-pay | Admitting: Family Medicine

## 2015-06-03 ENCOUNTER — Other Ambulatory Visit: Payer: Self-pay | Admitting: Family Medicine

## 2015-06-03 ENCOUNTER — Ambulatory Visit (INDEPENDENT_AMBULATORY_CARE_PROVIDER_SITE_OTHER): Payer: Medicare Other | Admitting: Family Medicine

## 2015-06-03 VITALS — BP 130/86 | HR 72 | Temp 98.0°F | Ht 68.25 in | Wt 189.5 lb

## 2015-06-03 DIAGNOSIS — I10 Essential (primary) hypertension: Secondary | ICD-10-CM

## 2015-06-03 DIAGNOSIS — Z1211 Encounter for screening for malignant neoplasm of colon: Secondary | ICD-10-CM

## 2015-06-03 DIAGNOSIS — Z7189 Other specified counseling: Secondary | ICD-10-CM | POA: Diagnosis not present

## 2015-06-03 DIAGNOSIS — Z23 Encounter for immunization: Secondary | ICD-10-CM

## 2015-06-03 DIAGNOSIS — Z0000 Encounter for general adult medical examination without abnormal findings: Secondary | ICD-10-CM | POA: Diagnosis not present

## 2015-06-03 DIAGNOSIS — N138 Other obstructive and reflux uropathy: Secondary | ICD-10-CM

## 2015-06-03 DIAGNOSIS — M545 Low back pain, unspecified: Secondary | ICD-10-CM

## 2015-06-03 DIAGNOSIS — N401 Enlarged prostate with lower urinary tract symptoms: Secondary | ICD-10-CM | POA: Diagnosis not present

## 2015-06-03 DIAGNOSIS — R6889 Other general symptoms and signs: Secondary | ICD-10-CM | POA: Diagnosis not present

## 2015-06-03 DIAGNOSIS — Z1159 Encounter for screening for other viral diseases: Secondary | ICD-10-CM

## 2015-06-03 LAB — COMPREHENSIVE METABOLIC PANEL
ALT: 26 U/L (ref 0–53)
AST: 20 U/L (ref 0–37)
Albumin: 4.1 g/dL (ref 3.5–5.2)
Alkaline Phosphatase: 44 U/L (ref 39–117)
BUN: 17 mg/dL (ref 6–23)
CHLORIDE: 102 meq/L (ref 96–112)
CO2: 32 meq/L (ref 19–32)
CREATININE: 1.03 mg/dL (ref 0.40–1.50)
Calcium: 10.2 mg/dL (ref 8.4–10.5)
GFR: 91.44 mL/min (ref >60.00)
GLUCOSE: 103 mg/dL — AB (ref 70–99)
Potassium: 3.9 mEq/L (ref 3.5–5.1)
SODIUM: 141 meq/L (ref 135–145)
Total Bilirubin: 0.8 mg/dL (ref 0.2–1.2)
Total Protein: 7.3 g/dL (ref 6.0–8.3)

## 2015-06-03 LAB — TSH: TSH: 2.23 u[IU]/mL (ref 0.35–4.50)

## 2015-06-03 LAB — LIPID PANEL
CHOL/HDL RATIO: 3
Cholesterol: 179 mg/dL (ref 0–200)
HDL: 52.4 mg/dL (ref >39.00)
LDL CALC: 99 mg/dL (ref 0–99)
NonHDL: 126.37
Triglycerides: 138 mg/dL (ref 0.0–149.0)
VLDL: 27.6 mg/dL (ref 0.0–40.0)

## 2015-06-03 LAB — PSA: PSA: 2.77 ng/mL (ref 0.10–4.00)

## 2015-06-03 LAB — HEPATITIS C ANTIBODY: HCV Ab: NEGATIVE

## 2015-06-03 MED ORDER — HYDROCODONE-ACETAMINOPHEN 5-325 MG PO TABS: 0.5000 | ORAL_TABLET | Freq: Two times a day (BID) | ORAL | Status: DC | PRN

## 2015-06-03 NOTE — Progress Notes (Signed)
Pre visit review using our clinic review tool, if applicable. No additional management support is needed unless otherwise documented below in the visit note. 

## 2015-06-03 NOTE — Assessment & Plan Note (Signed)
Advanced directives: has at home. Wife would be medical decision maker

## 2015-06-03 NOTE — Addendum Note (Signed)
Addended by: Daralene Milch C on: 06/03/2015 01:56 PM   Modules accepted: Orders, SmartSet

## 2015-06-03 NOTE — Assessment & Plan Note (Signed)
Check PSA today. Continue flomax.

## 2015-06-03 NOTE — Patient Instructions (Addendum)
labwork today. prevnar today. We will refer you for colonoscopy.  We will refer you for neurology evaluation. Nice to see you today, return as needed or in 6 months for follow up visit.  Health Maintenance, Male A healthy lifestyle and preventative care can promote health and wellness.  Maintain regular health, dental, and eye exams.  Eat a healthy diet. Foods like vegetables, fruits, whole grains, low-fat dairy products, and lean protein foods contain the nutrients you need and are low in calories. Decrease your intake of foods high in solid fats, added sugars, and salt. Get information about a proper diet from your health care provider, if necessary.  Regular physical exercise is one of the most important things you can do for your health. Most adults should get at least 150 minutes of moderate-intensity exercise (any activity that increases your heart rate and causes you to sweat) each week. In addition, most adults need muscle-strengthening exercises on 2 or more days a week.   Maintain a healthy weight. The body mass index (BMI) is a screening tool to identify possible weight problems. It provides an estimate of body fat based on height and weight. Your health care provider can find your BMI and can help you achieve or maintain a healthy weight. For males 20 years and older:  A BMI below 18.5 is considered underweight.  A BMI of 18.5 to 24.9 is normal.  A BMI of 25 to 29.9 is considered overweight.  A BMI of 30 and above is considered obese.  Maintain normal blood lipids and cholesterol by exercising and minimizing your intake of saturated fat. Eat a balanced diet with plenty of fruits and vegetables. Blood tests for lipids and cholesterol should begin at age 50 and be repeated every 5 years. If your lipid or cholesterol levels are high, you are over age 67, or you are at high risk for heart disease, you may need your cholesterol levels checked more frequently.Ongoing high lipid and  cholesterol levels should be treated with medicines if diet and exercise are not working.  If you smoke, find out from your health care provider how to quit. If you do not use tobacco, do not start.  Lung cancer screening is recommended for adults aged 75-80 years who are at high risk for developing lung cancer because of a history of smoking. A yearly low-dose CT scan of the lungs is recommended for people who have at least a 30-pack-year history of smoking and are current smokers or have quit within the past 15 years. A pack year of smoking is smoking an average of 1 pack of cigarettes a day for 1 year (for example, a 30-pack-year history of smoking could mean smoking 1 pack a day for 30 years or 2 packs a day for 15 years). Yearly screening should continue until the smoker has stopped smoking for at least 15 years. Yearly screening should be stopped for people who develop a health problem that would prevent them from having lung cancer treatment.  If you choose to drink alcohol, do not have more than 2 drinks per day. One drink is considered to be 12 oz (360 mL) of beer, 5 oz (150 mL) of wine, or 1.5 oz (45 mL) of liquor.  Avoid the use of street drugs. Do not share needles with anyone. Ask for help if you need support or instructions about stopping the use of drugs.  High blood pressure causes heart disease and increases the risk of stroke. High blood pressure is  more likely to develop in:  People who have blood pressure in the end of the normal range (100-139/85-89 mm Hg).  People who are overweight or obese.  People who are African American.  If you are 35-67 years of age, have your blood pressure checked every 3-5 years. If you are 43 years of age or older, have your blood pressure checked every year. You should have your blood pressure measured twice--once when you are at a hospital or clinic, and once when you are not at a hospital or clinic. Record the average of the two measurements. To  check your blood pressure when you are not at a hospital or clinic, you can use:  An automated blood pressure machine at a pharmacy.  A home blood pressure monitor.  If you are 2-56 years old, ask your health care provider if you should take aspirin to prevent heart disease.  Diabetes screening involves taking a blood sample to check your fasting blood sugar level. This should be done once every 3 years after age 79 if you are at a normal weight and without risk factors for diabetes. Testing should be considered at a younger age or be carried out more frequently if you are overweight and have at least 1 risk factor for diabetes.  Colorectal cancer can be detected and often prevented. Most routine colorectal cancer screening begins at the age of 63 and continues through age 22. However, your health care provider may recommend screening at an earlier age if you have risk factors for colon cancer. On a yearly basis, your health care provider may provide home test kits to check for hidden blood in the stool. A small camera at the end of a tube may be used to directly examine the colon (sigmoidoscopy or colonoscopy) to detect the earliest forms of colorectal cancer. Talk to your health care provider about this at age 47 when routine screening begins. A direct exam of the colon should be repeated every 5-10 years through age 20, unless early forms of precancerous polyps or small growths are found.  People who are at an increased risk for hepatitis B should be screened for this virus. You are considered at high risk for hepatitis B if:  You were born in a country where hepatitis B occurs often. Talk with your health care provider about which countries are considered high risk.  Your parents were born in a high-risk country and you have not received a shot to protect against hepatitis B (hepatitis B vaccine).  You have HIV or AIDS.  You use needles to inject street drugs.  You live with, or have sex  with, someone who has hepatitis B.  You are a man who has sex with other men (MSM).  You get hemodialysis treatment.  You take certain medicines for conditions like cancer, organ transplantation, and autoimmune conditions.  Hepatitis C blood testing is recommended for all people born from [birth year] through 1967 and any individual with known risk factors for hepatitis C.  Healthy men should no longer receive prostate-specific antigen (PSA) blood tests as part of routine cancer screening. Talk to your health care provider about prostate cancer screening.  Testicular cancer screening is not recommended for adolescents or adult males who have no symptoms. Screening includes self-exam, a health care provider exam, and other screening tests. Consult with your health care provider about any symptoms you have or any concerns you have about testicular cancer.  Practice safe sex. Use condoms and avoid high-risk sexual  practices to reduce the spread of sexually transmitted infections (STIs).  You should be screened for STIs, including gonorrhea and chlamydia if:  You are sexually active and are younger than 24 years.  You are older than 24 years, and your health care provider tells you that you are at risk for this type of infection.  Your sexual activity has changed since you were last screened, and you are at an increased risk for chlamydia or gonorrhea. Ask your health care provider if you are at risk.  If you are at risk of being infected with HIV, it is recommended that you take a prescription medicine daily to prevent HIV infection. This is called pre-exposure prophylaxis (PrEP). You are considered at risk if:  You are a man who has sex with other men (MSM).  You are a heterosexual man who is sexually active with multiple partners.  You take drugs by injection.  You are sexually active with a partner who has HIV.  Talk with your health care provider about whether you are at high risk of being  infected with HIV. If you choose to begin PrEP, you should first be tested for HIV. You should then be tested every 3 months for as long as you are taking PrEP.  Use sunscreen. Apply sunscreen liberally and repeatedly throughout the day. You should seek shade when your shadow is shorter than you. Protect yourself by wearing long sleeves, pants, a wide-brimmed hat, and sunglasses year round whenever you are outdoors.  Tell your health care provider of new moles or changes in moles, especially if there is a change in shape or color. Also, tell your health care provider if a mole is larger than the size of a pencil eraser.  A one-time screening for abdominal aortic aneurysm (AAA) and surgical repair of large AAAs by ultrasound is recommended for men aged 65-75 years who are current or former smokers.  Stay current with your vaccines (immunizations).   This information is not intended to replace advice given to you by your health care provider. Make sure you discuss any questions you have with your health care provider.   Document Released: 01/01/2008 Document Revised: 07/26/2014 Document Reviewed: 11/30/2010 Elsevier Interactive Patient Education [employer].

## 2015-06-03 NOTE — Assessment & Plan Note (Signed)

## 2015-06-03 NOTE — Assessment & Plan Note (Addendum)
Persistent concern for parkinson disease due to stiffness, hypokinesia, now with some noted memory troubles. Pt/wife agree today to neuro referral for further evaluation.

## 2015-06-03 NOTE — Progress Notes (Signed)
BP 130/86 mmHg  Pulse 72  Temp(Src) 98 F (36.7 C) (Oral)  Ht 5' 8.25" (1.734 m)  Wt 189 lb 8 oz (85.957 kg)  BMI 28.59 kg/m2   CC: medicare wellness visit  Subjective:    Patient ID: Brandon Manning, male    DOB: 03-10-1946, 69 y.o.   MRN: [id number]  HPI: Brandon Manning is a 69 y.o. male presenting on 06/03/2015 for Annual Exam   Presents with wife who always helps answer questions.   See prior note for details. Longstanding concern for parkinson disease given marked stiffness/rigidity on exam, but pt/wife have been hesitant for further evaluation in the past. Denies tremors, memory trouble, anosmia or ageusia.  Back pain - exercises and vicodin did help. Take 1/2 tablet vicodin as needed. Denies dizziness, unsteadiness, constipation. Last visit we prescribed seated rollator walker - he has been using this on weekends.   Hearing screen - passed Vision screen - with eye clinic 12/2014 Fall risk screen - passed Depression screen - passed  Preventative: Colon screening - would like colonoscopy as wife's family recently dx with colon cancer. Prostate screening - no prostate exam in past. Would like today. Nocturia x1, strong stream.  Flu shot - declines Pneumovax 2014. prevnar today  Last tetanus shot 2008.  Shingles shot - discussed, declines.  Advanced directives: has at home. Wife would be medical decision maker. Will bring me copy. Seat belt use discussed No changing moles on skin.   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 Interim medical history since our last visit reviewed. Allergies and medications reviewed and updated. Current Outpatient Prescriptions on File  Prior to Visit  Medication Sig  . aspirin EC 81 MG tablet Take 81 mg by mouth every other day.  . hydrochlorothiazide (MICROZIDE) 12.5 MG capsule Take 1 capsule (12.5 mg total) by mouth daily.  . Multiple Vitamins-Minerals (MULTIVITAMIN PO) Take 1 tablet by mouth daily.  . naproxen (NAPROSYN) 500 MG tablet TAKE ONE TABLET BY MOUTH TWICE DAILY WITH FOOD AS NEEDED FOR PAIN. USE SPARINGLY  . tamsulosin (FLOMAX) 0.4 MG CAPS capsule TAKE ONE CAPSULE BY MOUTH ONCE DAILY   No current facility-administered medications on file prior to visit.    Review of Systems Per HPI unless specifically indicated in ROS section     Objective:    BP 130/86 mmHg  Pulse 72  Temp(Src) 98 F (36.7 C) (Oral)  Ht 5' 8.25" (1.734 m)  Wt 189 lb 8 oz (85.957 kg)  BMI 28.59 kg/m2  Wt Readings from Last 3 Encounters:  06/03/15 189 lb 8 oz (85.957 kg)  05/05/15 185 lb 8 oz (84.142 kg)  11/15/14 188 lb 1.9 oz (85.331 kg)    Physical Exam  Constitutional: He is oriented to person, place, and time. He appears well-developed and well-nourished. No distress.  HENT:  Head: Normocephalic and atraumatic.  Right Ear: Hearing, tympanic membrane, external ear and ear canal normal.  Left Ear: Hearing, tympanic membrane, external ear and ear canal normal.  Nose: Nose normal.  Mouth/Throat: Uvula is midline, oropharynx is clear and moist and mucous membranes are normal. No oropharyngeal exudate, posterior oropharyngeal edema or posterior oropharyngeal erythema.  Eyes: Conjunctivae and EOM are normal. Pupils are equal, round, and reactive to light.  No scleral icterus.  Neck: Normal range of motion. Neck supple. Carotid bruit is not present. No thyromegaly present.  Cardiovascular: Normal rate, regular rhythm, normal heart sounds and intact distal pulses.   No murmur heard. Pulses:      Radial pulses are 2+ on the right side, and 2+ on the left side.  Pulmonary/Chest: Effort normal and breath sounds normal. No respiratory  distress. He has no wheezes. He has no rales.  Abdominal: Soft. Bowel sounds are normal. He exhibits no distension and no mass. There is no tenderness. There is no rebound and no guarding.  Genitourinary: Rectum normal and prostate normal. Rectal exam shows no external hemorrhoid, no internal hemorrhoid, no fissure, no mass, no tenderness and anal tone normal. Prostate is not enlarged and not tender.  Musculoskeletal: Normal range of motion. He exhibits no edema.  Lymphadenopathy:    He has no cervical adenopathy.  Neurological: He is alert and oriented to person, place, and time.  CN grossly intact, station and gait intact Shuffling gait Slowed movements Masked fascies Some cogwheeling RUE Wife helps him with belt buckle  Skin: Skin is warm and dry. No rash noted.  Psychiatric: He has a normal mood and affect. His behavior is normal. Judgment and thought content normal.  Nursing note and vitals reviewed.  Results for orders placed or performed in visit on [id number]  Basic metabolic panel  Result Value Ref Range   Sodium 139 135 - 145 mEq/L   Potassium 3.5 3.5 - 5.3 mEq/L   Chloride 101 96 - 112 mEq/L   CO2 29 19 - 32 mEq/L   Glucose, Bld 95 70 - 99 mg/dL   BUN 17 6 - 23 mg/dL   Creat 1.02 0.50 - 1.35 mg/dL   Calcium 9.7 8.4 - 10.5 mg/dL      Assessment & Plan:   Problem List Items Addressed This Visit    Medicare annual wellness visit, initial - Primary    I have personally reviewed the Medicare Annual Wellness questionnaire and have noted 1. The patient's medical and social history 2. Their use of alcohol, tobacco or illicit drugs 3. Their current medications and supplements 4. The patient's functional ability including ADL's, fall risks, home safety risks and hearing or visual impairment. Cognitive function has been assessed and addressed as indicated.  5. Diet and physical activity 6. Evidence for depression or mood disorders The patients weight, height, BMI have been  recorded in the chart. I have made referrals, counseling and provided education to the patient based on review of the above and I have provided the pt with a written personalized care plan for preventive services. Provider list updated.. See scanned questionairre as needed for further documentation. Reviewed preventative protocols and updated unless pt declined.       Hypokinesia    Persistent concern for parkinson disease due to stiffness, hypokinesia, now with some noted memory troubles. Pt/wife agree today to neuro referral for further evaluation.       Relevant Orders   Ambulatory referral to Neurology   HTN (hypertension)    Chronic, stable. Continue hctz.      Relevant Orders   Lipid panel   Comprehensive metabolic panel   TSH   BPH with obstruction/lower urinary tract symptoms    Check PSA today. Continue flomax.      Relevant Orders   PSA   Bilateral low back pain without sciatica    Improving with exercises and vicodin. No adverse drug effects noted. Continue  prn narcotic.      Relevant Medications   HYDROcodone-acetaminophen (NORCO/VICODIN) 5-325 MG tablet   Advanced care planning/counseling discussion    Advanced directives: has at home. Wife would be medical decision maker       Other Visit Diagnoses    Special screening for malignant neoplasms, colon        Relevant Orders    Ambulatory referral to Gastroenterology    Need for hepatitis C screening test        Relevant Orders    Hepatitis C antibody, reflex        Follow up plan: Return in about 6 months (around 12/01/2015), or as needed, for follow up visit.

## 2015-06-03 NOTE — Assessment & Plan Note (Addendum)
Improving with exercises and vicodin. No adverse drug effects noted. Continue prn narcotic.

## 2015-06-03 NOTE — Assessment & Plan Note (Signed)
Chronic, stable. Continue hctz.  

## 2015-06-05 ENCOUNTER — Encounter: Payer: Medicare Other | Admitting: Family Medicine

## 2015-06-06 ENCOUNTER — Encounter: Payer: Self-pay | Admitting: *Deleted

## 2015-07-17 ENCOUNTER — Other Ambulatory Visit: Payer: Self-pay

## 2015-07-17 MED ORDER — HYDROCODONE-ACETAMINOPHEN 5-325 MG PO TABS: 0.5000 | ORAL_TABLET | Freq: Two times a day (BID) | ORAL | Status: DC | PRN

## 2015-07-17 NOTE — Telephone Encounter (Signed)
Brandon Manning(DPR signed) left v/m requesting rx hydrocodone apap. Call when ready for pick up. Brandon Manning request to pick up early AM on 07/18/15. Pt last annual exam and  last printed # 30 on 06/03/15. Dr Darnell Level out of office; sending request to Allie Bossier NP.

## 2015-07-17 NOTE — Telephone Encounter (Signed)
Patient's wife notified and Rx's placed up front for pick up. 

## 2015-08-14 DIAGNOSIS — M4607 Spinal enthesopathy, lumbosacral region: Secondary | ICD-10-CM | POA: Diagnosis not present

## 2015-08-14 DIAGNOSIS — M9903 Segmental and somatic dysfunction of lumbar region: Secondary | ICD-10-CM | POA: Diagnosis not present

## 2015-08-14 DIAGNOSIS — M545 Low back pain: Secondary | ICD-10-CM | POA: Diagnosis not present

## 2015-08-21 DIAGNOSIS — M9903 Segmental and somatic dysfunction of lumbar region: Secondary | ICD-10-CM | POA: Diagnosis not present

## 2015-08-21 DIAGNOSIS — M4607 Spinal enthesopathy, lumbosacral region: Secondary | ICD-10-CM | POA: Diagnosis not present

## 2015-08-21 DIAGNOSIS — M545 Low back pain: Secondary | ICD-10-CM | POA: Diagnosis not present

## 2015-08-28 DIAGNOSIS — M9903 Segmental and somatic dysfunction of lumbar region: Secondary | ICD-10-CM | POA: Diagnosis not present

## 2015-08-28 DIAGNOSIS — M4607 Spinal enthesopathy, lumbosacral region: Secondary | ICD-10-CM | POA: Diagnosis not present

## 2015-08-28 DIAGNOSIS — M545 Low back pain: Secondary | ICD-10-CM | POA: Diagnosis not present

## 2015-08-29 DIAGNOSIS — M4607 Spinal enthesopathy, lumbosacral region: Secondary | ICD-10-CM | POA: Diagnosis not present

## 2015-08-29 DIAGNOSIS — M9903 Segmental and somatic dysfunction of lumbar region: Secondary | ICD-10-CM | POA: Diagnosis not present

## 2015-08-29 DIAGNOSIS — M545 Low back pain: Secondary | ICD-10-CM | POA: Diagnosis not present

## 2015-09-04 ENCOUNTER — Telehealth: Payer: Self-pay | Admitting: Family Medicine

## 2015-09-04 NOTE — Telephone Encounter (Signed)
error 

## 2015-09-05 ENCOUNTER — Other Ambulatory Visit: Payer: Self-pay | Admitting: Family Medicine

## 2015-09-08 ENCOUNTER — Other Ambulatory Visit: Payer: Self-pay

## 2015-09-08 NOTE — Telephone Encounter (Signed)
Pt left v/m requesting rx hydrocodone apap. Call when ready for pick up. Last printed # 30 on 07/17/15; last annual exam on 06/03/15.

## 2015-09-10 MED ORDER — HYDROCODONE-ACETAMINOPHEN 5-325 MG PO TABS: 0.5000 | ORAL_TABLET | Freq: Two times a day (BID) | ORAL | Status: DC | PRN

## 2015-09-10 NOTE — Telephone Encounter (Signed)
Printed and in Kim's box 

## 2015-09-11 NOTE — Telephone Encounter (Signed)
Pt picked up Rx yesterday

## 2015-09-15 ENCOUNTER — Emergency Department (HOSPITAL_COMMUNITY)
Admission: EM | Admit: 2015-09-15 | Discharge: 2015-09-15 | Disposition: A | Discharge status: Home / Self Care | Payer: Commercial Managed Care - PPO | Attending: Emergency Medicine | Admitting: Emergency Medicine

## 2015-09-15 ENCOUNTER — Encounter (HOSPITAL_COMMUNITY): Payer: Self-pay | Admitting: Emergency Medicine

## 2015-09-15 DIAGNOSIS — Z7982 Long term (current) use of aspirin: Secondary | ICD-10-CM | POA: Diagnosis not present

## 2015-09-15 DIAGNOSIS — K5900 Constipation, unspecified: Secondary | ICD-10-CM | POA: Diagnosis not present

## 2015-09-15 DIAGNOSIS — Z791 Long term (current) use of non-steroidal anti-inflammatories (NSAID): Secondary | ICD-10-CM | POA: Insufficient documentation

## 2015-09-15 DIAGNOSIS — Z8619 Personal history of other infectious and parasitic diseases: Secondary | ICD-10-CM | POA: Diagnosis not present

## 2015-09-15 DIAGNOSIS — I10 Essential (primary) hypertension: Secondary | ICD-10-CM | POA: Insufficient documentation

## 2015-09-15 DIAGNOSIS — Z79899 Other long term (current) drug therapy: Secondary | ICD-10-CM | POA: Insufficient documentation

## 2015-09-15 DIAGNOSIS — M545 Low back pain: Secondary | ICD-10-CM | POA: Diagnosis not present

## 2015-09-15 DIAGNOSIS — Z880 Allergy status to penicillin: Secondary | ICD-10-CM | POA: Diagnosis not present

## 2015-09-15 MED ORDER — POLYETHYLENE GLYCOL 3350 17 GM/SCOOP PO POWD: 1.0000 | Freq: Once | ORAL | Status: DC

## 2015-09-15 MED ORDER — FLEET ENEMA 7-19 GM/118ML RE ENEM
1.0000 | ENEMA | Freq: Once | RECTAL | Status: AC
  Administered 2015-09-15: 1 via RECTAL
  Filled 2015-09-15: qty 1

## 2015-09-15 NOTE — ED Notes (Signed)
Per Hilaria Ota, EMT- Pt had a BM with several hard stool balls.

## 2015-09-15 NOTE — Discharge Instructions (Signed)

## 2015-09-15 NOTE — ED Provider Notes (Signed)
Medical screening examination/treatment/procedure(s) were conducted as a shared visit with non-physician practitioner(s) and myself.  I personally evaluated the patient during the encounter.   EKG Interpretation None     70 y.o. male presents with constipation. No abdominal tenderness on exam. Plan to follow up with PCP as needed and return precautions discussed for worsening or new concerning symptoms.   See related encounter note   Leo Grosser, MD 09/16/15 [phone]

## 2015-09-15 NOTE — ED Notes (Signed)
Pt c/o constipation x 1 day. Pt tried stool softener without relief. Pt denies N/V. Pt is able to pass gas.
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
## 2015-09-15 NOTE — ED Notes (Signed)
Pt discharged with wife. Pt transported to waiting room by RN. Pt and wife voice no questions or concerns at this time. Discharge instructions reviewed.

## 2015-10-20 ENCOUNTER — Other Ambulatory Visit: Payer: Self-pay

## 2015-10-20 NOTE — Telephone Encounter (Signed)
V/M left requesting rx hydrocodone apap. Call when ready for pick up. rx last printed # 30 on 09/10/15. Last annual exam on 06/03/15.

## 2015-10-21 MED ORDER — HYDROCODONE-ACETAMINOPHEN 5-325 MG PO TABS: 0.5000 | ORAL_TABLET | Freq: Two times a day (BID) | ORAL | Status: DC | PRN

## 2015-10-21 NOTE — Telephone Encounter (Signed)
Patient notified and Rx placed up front for pick up. 

## 2015-10-21 NOTE — Telephone Encounter (Signed)
Printed and in Kim's box 

## 2015-11-28 ENCOUNTER — Other Ambulatory Visit: Payer: Self-pay | Admitting: Family Medicine

## 2015-12-04 ENCOUNTER — Ambulatory Visit: Payer: Medicare Other | Admitting: Family Medicine

## 2015-12-04 DIAGNOSIS — Z0289 Encounter for other administrative examinations: Secondary | ICD-10-CM

## 2015-12-05 ENCOUNTER — Telehealth: Payer: Self-pay | Admitting: Family Medicine

## 2015-12-05 NOTE — Telephone Encounter (Signed)
Would call to offer f/u appt.

## 2015-12-05 NOTE — Telephone Encounter (Signed)
Patient did not come for their scheduled appointment 5/18 6 month follow up  Please let me know if the patient needs to be contacted immediately for follow up or if no follow up is necessary.

## 2015-12-10 ENCOUNTER — Telehealth: Payer: Self-pay | Admitting: *Deleted

## 2015-12-10 MED ORDER — HYDROCODONE-ACETAMINOPHEN 5-325 MG PO TABS: 0.5000 | ORAL_TABLET | Freq: Two times a day (BID) | ORAL | Status: DC | PRN

## 2015-12-10 NOTE — Telephone Encounter (Signed)
Appointment 6/2 Spouse aware

## 2015-12-10 NOTE — Telephone Encounter (Signed)
No showed last week. appt rescheduled for next friday. Printed Rx and in [employer]' box.

## 2015-12-10 NOTE — Telephone Encounter (Signed)
Patient's wife called requesting a refill on Hydrocodone Last refill 10/21/15 #30 Last office visit 06/03/15 Call when ready for pickup

## 2015-12-11 NOTE — Telephone Encounter (Signed)
Patient's wife,Deborah,called to check on prescription.  Please call [name] when prescription is ready at [phone].

## 2015-12-11 NOTE — Telephone Encounter (Signed)
Patient's wife notified and Rx placed up front for pick up. 

## 2015-12-19 ENCOUNTER — Ambulatory Visit (INDEPENDENT_AMBULATORY_CARE_PROVIDER_SITE_OTHER): Payer: Medicare Other | Admitting: Family Medicine

## 2015-12-19 ENCOUNTER — Encounter: Payer: Self-pay | Admitting: Family Medicine

## 2015-12-19 VITALS — BP 126/90 | HR 96 | Temp 98.4°F | Wt 191.5 lb

## 2015-12-19 DIAGNOSIS — I10 Essential (primary) hypertension: Secondary | ICD-10-CM | POA: Diagnosis not present

## 2015-12-19 DIAGNOSIS — K5900 Constipation, unspecified: Secondary | ICD-10-CM

## 2015-12-19 DIAGNOSIS — M545 Low back pain, unspecified: Secondary | ICD-10-CM

## 2015-12-19 DIAGNOSIS — N401 Enlarged prostate with lower urinary tract symptoms: Secondary | ICD-10-CM | POA: Diagnosis not present

## 2015-12-19 DIAGNOSIS — R6889 Other general symptoms and signs: Secondary | ICD-10-CM | POA: Diagnosis not present

## 2015-12-19 DIAGNOSIS — K5909 Other constipation: Secondary | ICD-10-CM | POA: Insufficient documentation

## 2015-12-19 DIAGNOSIS — N138 Other obstructive and reflux uropathy: Secondary | ICD-10-CM

## 2015-12-19 MED ORDER — HYDROCHLOROTHIAZIDE 12.5 MG PO CAPS: 12.5000 mg | ORAL_CAPSULE | Freq: Every day | ORAL | Status: DC

## 2015-12-19 MED ORDER — TAMSULOSIN HCL 0.4 MG PO CAPS: 0.4000 mg | ORAL_CAPSULE | Freq: Every day | ORAL | Status: DC

## 2015-12-19 NOTE — Assessment & Plan Note (Signed)
Chronic, stable. Continue hctz 12.5mg daily.  

## 2015-12-19 NOTE — Assessment & Plan Note (Signed)
Stable with prn miralax. ?narcotic induced (prn use)

## 2015-12-19 NOTE — Assessment & Plan Note (Signed)
Stable on PRN 1/2 tab hydrocodone.

## 2015-12-19 NOTE — Patient Instructions (Addendum)
I wonder about parkinson symptoms. We will work on scheduling appointment with Dr Tat in [place] - movement specialist. Continue medicines as up to now.  Change flomax to nightly. Try to limit night time fluids, void prior to getting in bed.  Return in 6 months for medicare wellness visit

## 2015-12-19 NOTE — Assessment & Plan Note (Addendum)
Concern for PD since at least 01/2014 due to hypokinesia, rigidity, masked fasces, shuffling gait and now worsening urinary trouble (although predominantly nocturia). Endorses some imbalance when on his feet. Also with off and on memory trouble and tremors. Patient has stopped driving over the past year.  Pt/wife have been hesitant to seek further care for this but over the last 6 months have agreed to see neurology for further evaluation/treatment. Wife will work on taking time off in July to accompany him to neurology appointment.

## 2015-12-19 NOTE — Progress Notes (Signed)
BP 126/90 mmHg  Pulse 96  Temp(Src) 98.4 F (36.9 C) (Oral)  Wt 191 lb 8 oz (86.864 kg)   CC: f/u visit  Subjective:    Patient ID: Brandon Manning, male    DOB: 06/06/1946, 70 y.o.   MRN: [id number]  HPI: Brandon Manning is a 70 y.o. male presenting on 12/19/2015 for Follow-up   Very pleasant couple. Presents with wife who helps answer questions. See prior note for details. ER visit 08/2015 for back pain with constipation - now resolved with PRN miralax and PRN hydrocodone.   Longstanding concern for parkinson disease given marked stiffness/rigidity on exam, but pt/wife have been hesitant for further evaluation in the past. Last visit they agreed to see neurology for further evaluation but then they never returned phone call to schedule - there was a death in patient's family.   Wife planning on taking vacation time in July to schedule appointments for colonoscopy and neurology evaluation. Pt has stopped driving over the past year. Noticing some urinary incontinence despite flomax, predominantly at night.   Relevant past medical, surgical, family and social history reviewed and updated as indicated. Interim medical history since our last visit reviewed. Allergies and medications reviewed and updated. Current Outpatient Prescriptions on File Prior to Visit  Medication Sig  . aspirin EC 81 MG tablet Take 81 mg by mouth every other day.  Marland Kitchen HYDROcodone-acetaminophen (NORCO/VICODIN) 5-325 MG tablet Take 0.5 tablets by mouth 2 (two) times daily as needed for moderate pain.  . Multiple Vitamins-Minerals (MULTIVITAMIN PO) Take 1 tablet by mouth daily.  . polyethylene glycol powder (MIRALAX) powder Take 255 g by mouth once.   No current facility-administered medications on file prior to visit.    Review of Systems Per HPI unless specifically indicated in ROS section     Objective:    BP 126/90 mmHg  Pulse 96  Temp(Src) 98.4 F (36.9 C) (Oral)  Wt 191 lb 8 oz (86.864 kg)  Wt  Readings from Last 3 Encounters:  12/19/15 191 lb 8 oz (86.864 kg)  06/03/15 189 lb 8 oz (85.957 kg)  05/05/15 185 lb 8 oz (84.142 kg)   Body mass index is 28.89 kg/(m^2).  Physical Exam  Constitutional: He appears well-developed and well-nourished. No distress.  HENT:  Mouth/Throat: Oropharynx is clear and moist. No oropharyngeal exudate.  Eyes: Conjunctivae and EOM are normal. Pupils are equal, round, and reactive to light. No scleral icterus.  Cardiovascular: Normal rate, regular rhythm, normal heart sounds and intact distal pulses.   No murmur heard. Pulmonary/Chest: Effort normal and breath sounds normal. No respiratory distress. He has no wheezes. He has no rales.  Musculoskeletal: He exhibits no edema.  Neurological: He is alert.  Shuffling gait Slowed movements and responses Masked fascies Cogwheeling BUE Stiffness of digits  Skin: Skin is warm and dry. No rash noted.  Psychiatric: He has a normal mood and affect.  Nursing note and vitals reviewed.     Assessment & Plan:   Problem List Items Addressed This Visit    HTN (hypertension)    Chronic, stable. Continue hctz 12.5mg  daily.       Relevant Medications   hydrochlorothiazide (MICROZIDE) 12.5 MG capsule   BPH with obstruction/lower urinary tract symptoms    Ongoing nocturia. rec change flomax to nightly, discussed avoiding fluids at night, discussed voiding prior to bedtime. Consider further medication after neuro evaluation.       Relevant Medications   tamsulosin (FLOMAX) 0.4 MG CAPS capsule  Hypokinesia - Primary    Concern for PD since at least 01/2014 due to hypokinesia, rigidity, masked fasces, shuffling gait and now worsening urinary trouble (although predominantly nocturia). Endorses some imbalance when on his feet. Also with off and on memory trouble and tremors. Patient has stopped driving over the past year.  Pt/wife have been hesitant to seek further care for this but over the last 6 months have  agreed to see neurology for further evaluation/treatment. Wife will work on taking time off in July to accompany him to neurology appointment.       Relevant Orders   Ambulatory referral to Neurology   Bilateral low back pain without sciatica    Stable on PRN 1/2 tab hydrocodone.       Chronic constipation    Stable with prn miralax. ?narcotic induced (prn use)          Follow up plan: Return in about 6 months (around 06/19/2016), or as needed, for medicare wellness visit.  [name], MD

## 2015-12-19 NOTE — Assessment & Plan Note (Addendum)
Ongoing nocturia. rec change flomax to nightly, discussed avoiding fluids at night, discussed voiding prior to bedtime. Consider further medication after neuro evaluation.

## 2015-12-19 NOTE — Progress Notes (Signed)
Pre visit review using our clinic review tool, if applicable. No additional management support is needed unless otherwise documented below in the visit note. 

## 2016-01-26 ENCOUNTER — Other Ambulatory Visit: Payer: Self-pay

## 2016-01-26 NOTE — Telephone Encounter (Addendum)
Pt's wife (DPR signed) left note requesting rx hydrocodone apap. Call when ready for pick up. rx last printed # 30 on 12/10/15; pt last seen 12/19/15.

## 2016-01-27 MED ORDER — HYDROCODONE-ACETAMINOPHEN 5-325 MG PO TABS: 0.5000 | ORAL_TABLET | Freq: Two times a day (BID) | ORAL | Status: DC | PRN

## 2016-01-27 NOTE — Telephone Encounter (Signed)
Printed and in Kim's box 

## 2016-02-02 ENCOUNTER — Ambulatory Visit (INDEPENDENT_AMBULATORY_CARE_PROVIDER_SITE_OTHER): Payer: Medicare Other | Admitting: Neurology

## 2016-02-02 ENCOUNTER — Encounter: Payer: Self-pay | Admitting: Family Medicine

## 2016-02-02 ENCOUNTER — Encounter: Payer: Self-pay | Admitting: Neurology

## 2016-02-02 VITALS — BP 130/80 | HR 85 | Ht 69.0 in | Wt 191.0 lb

## 2016-02-02 DIAGNOSIS — F0280 Dementia in other diseases classified elsewhere without behavioral disturbance: Secondary | ICD-10-CM

## 2016-02-02 DIAGNOSIS — K117 Disturbances of salivary secretion: Secondary | ICD-10-CM

## 2016-02-02 DIAGNOSIS — G20 Parkinson's disease: Secondary | ICD-10-CM | POA: Diagnosis not present

## 2016-02-02 DIAGNOSIS — R292 Abnormal reflex: Secondary | ICD-10-CM | POA: Diagnosis not present

## 2016-02-02 MED ORDER — CARBIDOPA-LEVODOPA 25-100 MG PO TABS: 1.0000 | ORAL_TABLET | Freq: Three times a day (TID) | ORAL | Status: DC

## 2016-02-02 NOTE — Patient Instructions (Signed)
1. Start Carbidopa Levodopa as follows:  Take 1/2 tablet three times daily, at least 30 minutes before meals, for one week  Then take 1/2 tablet in the morning, 1/2 tablet in the afternoon, 1 tablet in the evening, at least 30 minutes before meals, for one week  Then take 1/2 tablet in the morning, 1 tablet in the afternoon, 1 tablet in the evening, at least 30 minutes before meals, for one week       Then take 1 tablet three times daily, at least 30 minutes before meals  2. We have referred you to [facility] for physical therapy. They will contact you directly to set up care.
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
## 2016-02-04 ENCOUNTER — Telehealth: Payer: Self-pay | Admitting: Neurology

## 2016-02-04 NOTE — Telephone Encounter (Signed)
Brandon Manning with [facility] called and needs a verbal order for PT/Dawn CB# [phone]

## 2016-02-05 NOTE — Telephone Encounter (Signed)
Verbal given to twice weekly x 3 weeks.

## 2016-02-12 DIAGNOSIS — Z7982 Long term (current) use of aspirin: Secondary | ICD-10-CM | POA: Diagnosis not present

## 2016-02-12 DIAGNOSIS — G20 Parkinson's disease: Secondary | ICD-10-CM | POA: Diagnosis not present

## 2016-02-12 DIAGNOSIS — Z87891 Personal history of nicotine dependence: Secondary | ICD-10-CM | POA: Diagnosis not present

## 2016-02-12 DIAGNOSIS — I959 Hypotension, unspecified: Secondary | ICD-10-CM | POA: Diagnosis not present

## 2016-02-12 DIAGNOSIS — I10 Essential (primary) hypertension: Secondary | ICD-10-CM | POA: Diagnosis not present

## 2016-02-12 DIAGNOSIS — F0280 Dementia in other diseases classified elsewhere without behavioral disturbance: Secondary | ICD-10-CM | POA: Diagnosis not present

## 2016-02-12 DIAGNOSIS — K117 Disturbances of salivary secretion: Secondary | ICD-10-CM | POA: Diagnosis not present

## 2016-02-12 DIAGNOSIS — K5900 Constipation, unspecified: Secondary | ICD-10-CM | POA: Diagnosis not present

## 2016-02-13 ENCOUNTER — Telehealth: Payer: Self-pay | Admitting: Neurology

## 2016-02-13 DIAGNOSIS — F0280 Dementia in other diseases classified elsewhere without behavioral disturbance: Secondary | ICD-10-CM | POA: Diagnosis not present

## 2016-02-13 DIAGNOSIS — I959 Hypotension, unspecified: Secondary | ICD-10-CM | POA: Diagnosis not present

## 2016-02-13 DIAGNOSIS — I10 Essential (primary) hypertension: Secondary | ICD-10-CM | POA: Diagnosis not present

## 2016-02-13 DIAGNOSIS — K117 Disturbances of salivary secretion: Secondary | ICD-10-CM | POA: Diagnosis not present

## 2016-02-13 DIAGNOSIS — K5900 Constipation, unspecified: Secondary | ICD-10-CM | POA: Diagnosis not present

## 2016-02-13 DIAGNOSIS — G20 Parkinson's disease: Secondary | ICD-10-CM | POA: Diagnosis not present

## 2016-02-13 NOTE — Telephone Encounter (Signed)
Verbal 2 x week for 5 weeks PT given to Amy.

## 2016-02-13 NOTE — Telephone Encounter (Signed)
LMOM for Amy to call back.

## 2016-02-16 ENCOUNTER — Telehealth: Payer: Self-pay | Admitting: Neurology

## 2016-02-16 NOTE — Telephone Encounter (Signed)
Sharyn Lull with [facility] left message for an OT order. [id number]. Verbal order given. To call back with any questions.

## 2016-02-18 DIAGNOSIS — K5900 Constipation, unspecified: Secondary | ICD-10-CM | POA: Diagnosis not present

## 2016-02-18 DIAGNOSIS — I10 Essential (primary) hypertension: Secondary | ICD-10-CM | POA: Diagnosis not present

## 2016-02-18 DIAGNOSIS — I959 Hypotension, unspecified: Secondary | ICD-10-CM | POA: Diagnosis not present

## 2016-02-18 DIAGNOSIS — K117 Disturbances of salivary secretion: Secondary | ICD-10-CM | POA: Diagnosis not present

## 2016-02-18 DIAGNOSIS — G20 Parkinson's disease: Secondary | ICD-10-CM | POA: Diagnosis not present

## 2016-02-18 DIAGNOSIS — F0280 Dementia in other diseases classified elsewhere without behavioral disturbance: Secondary | ICD-10-CM | POA: Diagnosis not present

## 2016-02-20 DIAGNOSIS — K5900 Constipation, unspecified: Secondary | ICD-10-CM | POA: Diagnosis not present

## 2016-02-20 DIAGNOSIS — F0280 Dementia in other diseases classified elsewhere without behavioral disturbance: Secondary | ICD-10-CM | POA: Diagnosis not present

## 2016-02-20 DIAGNOSIS — G20 Parkinson's disease: Secondary | ICD-10-CM | POA: Diagnosis not present

## 2016-02-20 DIAGNOSIS — I10 Essential (primary) hypertension: Secondary | ICD-10-CM | POA: Diagnosis not present

## 2016-02-20 DIAGNOSIS — I959 Hypotension, unspecified: Secondary | ICD-10-CM | POA: Diagnosis not present

## 2016-02-20 DIAGNOSIS — K117 Disturbances of salivary secretion: Secondary | ICD-10-CM | POA: Diagnosis not present

## 2016-02-25 DIAGNOSIS — I10 Essential (primary) hypertension: Secondary | ICD-10-CM | POA: Diagnosis not present

## 2016-02-25 DIAGNOSIS — K5900 Constipation, unspecified: Secondary | ICD-10-CM | POA: Diagnosis not present

## 2016-02-25 DIAGNOSIS — F0280 Dementia in other diseases classified elsewhere without behavioral disturbance: Secondary | ICD-10-CM | POA: Diagnosis not present

## 2016-02-25 DIAGNOSIS — K117 Disturbances of salivary secretion: Secondary | ICD-10-CM | POA: Diagnosis not present

## 2016-02-25 DIAGNOSIS — G20 Parkinson's disease: Secondary | ICD-10-CM | POA: Diagnosis not present

## 2016-02-25 DIAGNOSIS — I959 Hypotension, unspecified: Secondary | ICD-10-CM | POA: Diagnosis not present

## 2016-02-26 DIAGNOSIS — G20 Parkinson's disease: Secondary | ICD-10-CM | POA: Diagnosis not present

## 2016-02-26 DIAGNOSIS — I959 Hypotension, unspecified: Secondary | ICD-10-CM | POA: Diagnosis not present

## 2016-02-26 DIAGNOSIS — K117 Disturbances of salivary secretion: Secondary | ICD-10-CM | POA: Diagnosis not present

## 2016-02-26 DIAGNOSIS — I10 Essential (primary) hypertension: Secondary | ICD-10-CM | POA: Diagnosis not present

## 2016-02-26 DIAGNOSIS — K5900 Constipation, unspecified: Secondary | ICD-10-CM | POA: Diagnosis not present

## 2016-02-26 DIAGNOSIS — F0280 Dementia in other diseases classified elsewhere without behavioral disturbance: Secondary | ICD-10-CM | POA: Diagnosis not present

## 2016-02-27 DIAGNOSIS — G20 Parkinson's disease: Secondary | ICD-10-CM | POA: Diagnosis not present

## 2016-02-27 DIAGNOSIS — I959 Hypotension, unspecified: Secondary | ICD-10-CM | POA: Diagnosis not present

## 2016-02-27 DIAGNOSIS — K5900 Constipation, unspecified: Secondary | ICD-10-CM | POA: Diagnosis not present

## 2016-02-27 DIAGNOSIS — K117 Disturbances of salivary secretion: Secondary | ICD-10-CM | POA: Diagnosis not present

## 2016-02-27 DIAGNOSIS — F0280 Dementia in other diseases classified elsewhere without behavioral disturbance: Secondary | ICD-10-CM | POA: Diagnosis not present

## 2016-02-27 DIAGNOSIS — I10 Essential (primary) hypertension: Secondary | ICD-10-CM | POA: Diagnosis not present

## 2016-02-28 DIAGNOSIS — K117 Disturbances of salivary secretion: Secondary | ICD-10-CM | POA: Diagnosis not present

## 2016-02-28 DIAGNOSIS — I10 Essential (primary) hypertension: Secondary | ICD-10-CM | POA: Diagnosis not present

## 2016-02-28 DIAGNOSIS — F0280 Dementia in other diseases classified elsewhere without behavioral disturbance: Secondary | ICD-10-CM | POA: Diagnosis not present

## 2016-02-28 DIAGNOSIS — I959 Hypotension, unspecified: Secondary | ICD-10-CM | POA: Diagnosis not present

## 2016-02-28 DIAGNOSIS — K5900 Constipation, unspecified: Secondary | ICD-10-CM | POA: Diagnosis not present

## 2016-02-28 DIAGNOSIS — G20 Parkinson's disease: Secondary | ICD-10-CM | POA: Diagnosis not present

## 2016-03-02 DIAGNOSIS — F0280 Dementia in other diseases classified elsewhere without behavioral disturbance: Secondary | ICD-10-CM | POA: Diagnosis not present

## 2016-03-02 DIAGNOSIS — I959 Hypotension, unspecified: Secondary | ICD-10-CM | POA: Diagnosis not present

## 2016-03-02 DIAGNOSIS — I10 Essential (primary) hypertension: Secondary | ICD-10-CM | POA: Diagnosis not present

## 2016-03-02 DIAGNOSIS — K5900 Constipation, unspecified: Secondary | ICD-10-CM | POA: Diagnosis not present

## 2016-03-02 DIAGNOSIS — G20 Parkinson's disease: Secondary | ICD-10-CM | POA: Diagnosis not present

## 2016-03-02 DIAGNOSIS — K117 Disturbances of salivary secretion: Secondary | ICD-10-CM | POA: Diagnosis not present

## 2016-03-03 DIAGNOSIS — K5900 Constipation, unspecified: Secondary | ICD-10-CM | POA: Diagnosis not present

## 2016-03-03 DIAGNOSIS — G20 Parkinson's disease: Secondary | ICD-10-CM | POA: Diagnosis not present

## 2016-03-03 DIAGNOSIS — F0280 Dementia in other diseases classified elsewhere without behavioral disturbance: Secondary | ICD-10-CM | POA: Diagnosis not present

## 2016-03-03 DIAGNOSIS — K117 Disturbances of salivary secretion: Secondary | ICD-10-CM | POA: Diagnosis not present

## 2016-03-03 DIAGNOSIS — I10 Essential (primary) hypertension: Secondary | ICD-10-CM | POA: Diagnosis not present

## 2016-03-03 DIAGNOSIS — I959 Hypotension, unspecified: Secondary | ICD-10-CM | POA: Diagnosis not present

## 2016-03-04 DIAGNOSIS — I10 Essential (primary) hypertension: Secondary | ICD-10-CM | POA: Diagnosis not present

## 2016-03-04 DIAGNOSIS — I959 Hypotension, unspecified: Secondary | ICD-10-CM | POA: Diagnosis not present

## 2016-03-04 DIAGNOSIS — K117 Disturbances of salivary secretion: Secondary | ICD-10-CM | POA: Diagnosis not present

## 2016-03-04 DIAGNOSIS — K5900 Constipation, unspecified: Secondary | ICD-10-CM | POA: Diagnosis not present

## 2016-03-04 DIAGNOSIS — F0280 Dementia in other diseases classified elsewhere without behavioral disturbance: Secondary | ICD-10-CM | POA: Diagnosis not present

## 2016-03-04 DIAGNOSIS — G20 Parkinson's disease: Secondary | ICD-10-CM | POA: Diagnosis not present

## 2016-03-09 DIAGNOSIS — K117 Disturbances of salivary secretion: Secondary | ICD-10-CM | POA: Diagnosis not present

## 2016-03-09 DIAGNOSIS — I10 Essential (primary) hypertension: Secondary | ICD-10-CM | POA: Diagnosis not present

## 2016-03-09 DIAGNOSIS — K5900 Constipation, unspecified: Secondary | ICD-10-CM | POA: Diagnosis not present

## 2016-03-09 DIAGNOSIS — I959 Hypotension, unspecified: Secondary | ICD-10-CM | POA: Diagnosis not present

## 2016-03-09 DIAGNOSIS — G20 Parkinson's disease: Secondary | ICD-10-CM | POA: Diagnosis not present

## 2016-03-09 DIAGNOSIS — F0280 Dementia in other diseases classified elsewhere without behavioral disturbance: Secondary | ICD-10-CM | POA: Diagnosis not present

## 2016-03-10 ENCOUNTER — Telehealth: Payer: Self-pay

## 2016-03-10 NOTE — Telephone Encounter (Signed)
Pt is a [facility] patient. Will forward to [facility].

## 2016-03-10 NOTE — Telephone Encounter (Signed)
-----   Message from Elenora Fender sent at 03/10/2016  1:01 PM EDT ----- Amy from [facility] called regarding Patient Brandon Manning 07/20/1945. She said he is doing really well. She would like to go ahead and discharge him. He does still have 2 visits left with her but she feels he does not need them. She would like you to call her to let her know if that's ok. Her number is [phone]. Thank you

## 2016-03-11 DIAGNOSIS — K117 Disturbances of salivary secretion: Secondary | ICD-10-CM | POA: Diagnosis not present

## 2016-03-11 DIAGNOSIS — F0280 Dementia in other diseases classified elsewhere without behavioral disturbance: Secondary | ICD-10-CM | POA: Diagnosis not present

## 2016-03-11 DIAGNOSIS — K5900 Constipation, unspecified: Secondary | ICD-10-CM | POA: Diagnosis not present

## 2016-03-11 DIAGNOSIS — I959 Hypotension, unspecified: Secondary | ICD-10-CM | POA: Diagnosis not present

## 2016-03-11 DIAGNOSIS — I10 Essential (primary) hypertension: Secondary | ICD-10-CM | POA: Diagnosis not present

## 2016-03-11 DIAGNOSIS — G20 Parkinson's disease: Secondary | ICD-10-CM | POA: Diagnosis not present

## 2016-03-11 NOTE — Telephone Encounter (Signed)
Okay to tell her to discharge?

## 2016-03-11 NOTE — Telephone Encounter (Signed)
Amy made aware okay.

## 2016-03-11 NOTE — Telephone Encounter (Signed)
ok 

## 2016-04-05 ENCOUNTER — Other Ambulatory Visit: Payer: Self-pay

## 2016-04-05 NOTE — Telephone Encounter (Signed)
Brandon Manning pts wife (DPR signed) left v/m requesting rx hydrocodone apap. Call when ready for pick up. Last printed # 30 on 01/27/16. Last seen 12/19/15.

## 2016-04-06 MED ORDER — HYDROCODONE-ACETAMINOPHEN 5-325 MG PO TABS: 0.5000 | ORAL_TABLET | Freq: Two times a day (BID) | ORAL | 0 refills | Status: DC | PRN

## 2016-04-06 NOTE — Telephone Encounter (Signed)
Patient notified and Rx placed up front for pick up. 

## 2016-04-06 NOTE — Telephone Encounter (Signed)
Printed and in Kim's box 
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
## 2016-04-30 ENCOUNTER — Encounter: Payer: Self-pay | Admitting: Neurology

## 2016-04-30 ENCOUNTER — Ambulatory Visit (INDEPENDENT_AMBULATORY_CARE_PROVIDER_SITE_OTHER): Payer: Commercial Managed Care - PPO | Admitting: Neurology

## 2016-04-30 VITALS — BP 130/80 | HR 78 | Ht 69.0 in | Wt 187.0 lb

## 2016-04-30 DIAGNOSIS — F0280 Dementia in other diseases classified elsewhere without behavioral disturbance: Secondary | ICD-10-CM | POA: Diagnosis not present

## 2016-04-30 DIAGNOSIS — G20 Parkinson's disease: Secondary | ICD-10-CM

## 2016-04-30 MED ORDER — CARBIDOPA-LEVODOPA 25-100 MG PO TABS: ORAL_TABLET | ORAL | 3 refills | Status: DC

## 2016-04-30 NOTE — Patient Instructions (Signed)
Increase Levodopa to 2 tablets at 7 am, 1 tablet at 12 pm, 1 tablet at 4-5 pm. We will see you in 4-5 weeks on medication.

## 2016-05-17 ENCOUNTER — Other Ambulatory Visit: Payer: Self-pay

## 2016-05-17 NOTE — Telephone Encounter (Signed)
v/m requesting rx hydrocodone apap. Call when ready for pick up. Last printed # 30 on 04/06/16. Pt last seen 12/19/15.

## 2016-05-18 MED ORDER — HYDROCODONE-ACETAMINOPHEN 5-325 MG PO TABS: 0.5000 | ORAL_TABLET | Freq: Two times a day (BID) | ORAL | 0 refills | Status: DC | PRN

## 2016-05-18 NOTE — Telephone Encounter (Signed)
Printed and in Kim's box 

## 2016-05-18 NOTE — Telephone Encounter (Signed)
Attempted to call patient. Mailbox full. Could not leave message. Rx placed up front for pick up.

## 2016-06-18 ENCOUNTER — Other Ambulatory Visit: Payer: Self-pay

## 2016-06-18 MED ORDER — HYDROCODONE-ACETAMINOPHEN 5-325 MG PO TABS: 0.5000 | ORAL_TABLET | Freq: Two times a day (BID) | ORAL | 0 refills | Status: DC | PRN

## 2016-06-18 NOTE — Telephone Encounter (Signed)
V/M left at 4:52 for rx for hydrocodone apap. Call when ready for pick up; would like to pick up on 06/21/16 or 06/22/16. Last printed # 30 on 05/18/16; pt last seen 04/30/16.

## 2016-06-18 NOTE — Telephone Encounter (Signed)
Printed and in Kim's box 

## 2016-06-22 ENCOUNTER — Telehealth: Payer: Self-pay | Admitting: Family Medicine

## 2016-06-22 NOTE — Telephone Encounter (Signed)
Spoke with patient's wife.

## 2016-06-22 NOTE — Telephone Encounter (Signed)
Spouse returned  Your call. About pt rx

## 2016-06-22 NOTE — Telephone Encounter (Signed)
Patient's wife notified and Rx placed up front for pick up. 
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
## 2016-06-25 ENCOUNTER — Ambulatory Visit (INDEPENDENT_AMBULATORY_CARE_PROVIDER_SITE_OTHER): Payer: Commercial Managed Care - PPO | Admitting: Neurology

## 2016-06-25 ENCOUNTER — Encounter: Payer: Self-pay | Admitting: Neurology

## 2016-06-25 ENCOUNTER — Encounter: Payer: Medicare Other | Admitting: Family Medicine

## 2016-06-25 ENCOUNTER — Encounter: Payer: Self-pay | Admitting: *Deleted

## 2016-06-25 VITALS — BP 114/60 | HR 98 | Ht 71.0 in | Wt 188.0 lb

## 2016-06-25 DIAGNOSIS — G20 Parkinson's disease: Secondary | ICD-10-CM | POA: Diagnosis not present

## 2016-06-25 DIAGNOSIS — F0280 Dementia in other diseases classified elsewhere without behavioral disturbance: Secondary | ICD-10-CM | POA: Diagnosis not present

## 2016-07-08 ENCOUNTER — Ambulatory Visit (INDEPENDENT_AMBULATORY_CARE_PROVIDER_SITE_OTHER): Payer: Commercial Managed Care - PPO | Admitting: Family Medicine

## 2016-07-08 ENCOUNTER — Encounter: Payer: Self-pay | Admitting: Family Medicine

## 2016-07-08 VITALS — BP 118/84 | HR 84 | Temp 97.5°F | Ht 68.5 in | Wt 188.2 lb

## 2016-07-08 DIAGNOSIS — N401 Enlarged prostate with lower urinary tract symptoms: Secondary | ICD-10-CM

## 2016-07-08 DIAGNOSIS — F02818 Dementia in other diseases classified elsewhere, unspecified severity, with other behavioral disturbance: Secondary | ICD-10-CM | POA: Insufficient documentation

## 2016-07-08 DIAGNOSIS — Z0000 Encounter for general adult medical examination without abnormal findings: Secondary | ICD-10-CM | POA: Diagnosis not present

## 2016-07-08 DIAGNOSIS — R413 Other amnesia: Secondary | ICD-10-CM

## 2016-07-08 DIAGNOSIS — N138 Other obstructive and reflux uropathy: Secondary | ICD-10-CM

## 2016-07-08 DIAGNOSIS — G20 Parkinson's disease: Secondary | ICD-10-CM | POA: Diagnosis not present

## 2016-07-08 DIAGNOSIS — G20A1 Parkinson's disease without dyskinesia, without mention of fluctuations: Secondary | ICD-10-CM

## 2016-07-08 DIAGNOSIS — I10 Essential (primary) hypertension: Secondary | ICD-10-CM | POA: Diagnosis not present

## 2016-07-08 DIAGNOSIS — Z7189 Other specified counseling: Secondary | ICD-10-CM

## 2016-07-08 DIAGNOSIS — K5909 Other constipation: Secondary | ICD-10-CM

## 2016-07-08 MED ORDER — HYDROCHLOROTHIAZIDE 12.5 MG PO CAPS
12.5000 mg | ORAL_CAPSULE | Freq: Every day | ORAL | 3 refills | Status: DC
Start: 2016-07-08 — End: 2017-01-28

## 2016-07-08 MED ORDER — TAMSULOSIN HCL 0.4 MG PO CAPS: 0.4000 mg | ORAL_CAPSULE | Freq: Every day | ORAL | 3 refills | Status: DC

## 2016-07-08 NOTE — Assessment & Plan Note (Signed)

## 2016-07-08 NOTE — Assessment & Plan Note (Signed)
Advanced directives: has at home. Wife would be medical decision maker. Will bring me copy. 

## 2016-07-08 NOTE — Assessment & Plan Note (Signed)
Anticipate excellent neurology care of patient. Marked improvement noted by patient and family on dopaminergic regimen at this time.

## 2016-07-08 NOTE — Progress Notes (Signed)
Pre visit review using our clinic review tool, if applicable. No additional management support is needed unless otherwise documented below in the visit note. 

## 2016-07-08 NOTE — Assessment & Plan Note (Signed)
Chronic, stable on miralax a few times a week.

## 2016-07-08 NOTE — Assessment & Plan Note (Signed)
Chronic, stable. Continue flomax.

## 2016-07-08 NOTE — Progress Notes (Addendum)
BP 118/84 (BP Location: Left Arm, Patient Position: Sitting, Cuff Size: Normal)   Pulse 84   Temp 97.5 F (36.4 C) (Oral)   Ht 5' 8.5" (1.74 m)   Wt 188 lb 4 oz (85.4 kg)   SpO2 99%   BMI 28.21 kg/m    CC: medicare wellness visit Subjective:    Patient ID: Brandon Manning, male    DOB: Sep 06, 1945, 70 y.o.   MRN: [id number]  HPI: Brandon Manning is a 70 y.o. male presenting on 07/08/2016 for Medicare Wellness   Established this year with Dr Tat for parkinson's disease, started on sinemet IR 25/100mg  TID (2/1/1). Noted significant improvement on medication  Preventative: Colon screening - would like colonoscopy possibly this spring. discussed - will do cologuard.  Prostate screening - DRE/PSA normal last year, declines today. Flu shot - declines Pneumovax 2014. prevnar 2016 Last tetanus shot 2008.  Shingles shot - discussed, declines.  Advanced directives: has at home. Wife would be medical decision maker. Will bring me copy. Seat belt use discussed Sunscreen use discussed. No changing moles on skin.  Ex smoker - remotely Alcohol - none  Caffeine: occasional caffeine Lives with wife, 34yo son Occupation: retired, worked at [employer]: 8th grade Activity: walking outside  Diet: fruits/vegetables daily, water, occasional red meat, fish 1x/wk   Relevant past medical, surgical, family and social history reviewed and updated as indicated. Interim medical history since our last visit reviewed. Allergies and medications reviewed and updated. Current Outpatient Prescriptions on File Prior to Visit  Medication Sig  . aspirin EC 81 MG tablet Take 81 mg by mouth every other day.  . carbidopa-levodopa (SINEMET IR) 25-100 MG tablet 2 tablet at 7 am, 1 at 12 pm, 1 at 5 pm  . HYDROcodone-acetaminophen (NORCO/VICODIN) 5-325 MG tablet Take 0.5 tablets by mouth 2 (two) times daily as needed for moderate pain.  . Multiple Vitamins-Minerals (MULTIVITAMIN  PO) Take 1 tablet by mouth daily.  . polyethylene glycol powder (MIRALAX) powder Take 255 g by mouth once.   No current facility-administered medications on file prior to visit.     Review of Systems Per HPI unless specifically indicated in ROS section     Objective:    BP 118/84 (BP Location: Left Arm, Patient Position: Sitting, Cuff Size: Normal)   Pulse 84   Temp 97.5 F (36.4 C) (Oral)   Ht 5' 8.5" (1.74 m)   Wt 188 lb 4 oz (85.4 kg)   SpO2 99%   BMI 28.21 kg/m   Wt Readings from Last 3 Encounters:  07/08/16 188 lb 4 oz (85.4 kg)  06/25/16 188 lb (85.3 kg)  04/30/16 187 lb (84.8 kg)    Physical Exam  Constitutional: He is oriented to person, place, and time. He appears well-developed and well-nourished. No distress.  HENT:  Head: Normocephalic and atraumatic.  Right Ear: Hearing, tympanic membrane, external ear and ear canal normal.  Left Ear: Hearing, tympanic membrane, external ear and ear canal normal.  Nose: Nose normal.  Mouth/Throat: Uvula is midline, oropharynx is clear and moist and mucous membranes are normal. No oropharyngeal exudate, posterior oropharyngeal edema or posterior oropharyngeal erythema.  Eyes: Conjunctivae and EOM are normal. Pupils are equal, round, and reactive to light. No scleral icterus.  Neck: Normal range of motion. Neck supple.  Cardiovascular: Normal rate, regular rhythm, normal heart sounds and intact distal pulses.   No murmur heard. Pulses:      Radial pulses are 2+  on the right side, and 2+ on the left side.  Pulmonary/Chest: Effort normal and breath sounds normal. No respiratory distress. He has no wheezes. He has no rales.  Abdominal: Soft. Bowel sounds are normal. He exhibits no distension and no mass. There is no tenderness. There is no rebound and no guarding.  Musculoskeletal: Normal range of motion. He exhibits no edema.  Lymphadenopathy:    He has no cervical adenopathy.  Neurological: He is alert and oriented to person,  place, and time.  0/3 registration Unable to do calculation Did not know president name Did not know date Improved movement on dopaminergic regimen  Skin: Skin is warm and dry. No rash noted.  Psychiatric: He has a normal mood and affect. His behavior is normal. Judgment and thought content normal.  Nursing note and vitals reviewed.  Results for orders placed or performed in visit on 06/03/15  Hepatitis C antibody  Result Value Ref Range   HCV Ab NEGATIVE NEGATIVE      Assessment & Plan:   Problem List Items Addressed This Visit    Advanced care planning/counseling discussion    Advanced directives: has at home. Wife would be medical decision maker. Will bring me copy.      BPH with obstruction/lower urinary tract symptoms    Chronic, stable. Continue flomax.       Relevant Medications   tamsulosin (FLOMAX) 0.4 MG CAPS capsule   Other Relevant Orders   PSA   Chronic constipation    Chronic, stable on miralax a few times a week.       HTN (hypertension)    Chronic, stable. Continue hctz.       Relevant Medications   hydrochlorothiazide (MICROZIDE) 12.5 MG capsule   Other Relevant Orders   Lipid panel   Basic metabolic panel   Medicare annual wellness visit, initial - Primary    I have personally reviewed the Medicare Annual Wellness questionnaire and have noted 1. The patient's medical and social history 2. Their use of alcohol, tobacco or illicit drugs 3. Their current medications and supplements 4. The patient's functional ability including ADL's, fall risks, home safety risks and hearing or visual impairment. Cognitive function has been assessed and addressed as indicated.  5. Diet and physical activity 6. Evidence for depression or mood disorders The patients weight, height, BMI have been recorded in the chart. I have made referrals, counseling and provided education to the patient based on review of the above and I have provided the pt with a written  personalized care plan for preventive services. Provider list updated.. See scanned questionairre as needed for further documentation. Reviewed preventative protocols and updated unless pt declined.       Memory deficit    Trouble with orientation, registration and recall, unable to do calculation despite 9th grade education. Anticipate PD related. Check MMSE at 6 mo f/u visit.       Parkinson disease [facility])    Anticipate excellent neurology care of patient. Marked improvement noted by patient and family on dopaminergic regimen at this time.       Relevant Orders   CBC with Differential/Platelet       Follow up plan: Return in about 6 months (around 01/06/2017) for follow up visit.  [name], MD

## 2016-07-08 NOTE — Assessment & Plan Note (Signed)
Chronic, stable. Continue hctz.  

## 2016-07-08 NOTE — Assessment & Plan Note (Signed)
Trouble with orientation, registration and recall, unable to do calculation despite 9th grade education. Anticipate PD related. Check MMSE at 6 mo f/u visit.

## 2016-07-08 NOTE — Patient Instructions (Addendum)
We will sign you up for cologuard colon canceer screening stool test.  Return on Thursday for fasting labs.  Bring me copy of your living will to update your chart. Good to see you today!  Return as needed or in 6 months for follow up visit.   Health Maintenance, Male A healthy lifestyle and preventative care can promote health and wellness.  Maintain regular health, dental, and eye exams.  Eat a healthy diet. Foods like vegetables, fruits, whole grains, low-fat dairy products, and lean protein foods contain the nutrients you need and are low in calories. Decrease your intake of foods high in solid fats, added sugars, and salt. Get information about a proper diet from your health care provider, if necessary.  Regular physical exercise is one of the most important things you can do for your health. Most adults should get at least 150 minutes of moderate-intensity exercise (any activity that increases your heart rate and causes you to sweat) each week. In addition, most adults need muscle-strengthening exercises on 2 or more days a week.   Maintain a healthy weight. The body mass index (BMI) is a screening tool to identify possible weight problems. It provides an estimate of body fat based on height and weight. Your health care provider can find your BMI and can help you achieve or maintain a healthy weight. For males 20 years and older:  A BMI below 18.5 is considered underweight.  A BMI of 18.5 to 24.9 is normal.  A BMI of 25 to 29.9 is considered overweight.  A BMI of 30 and above is considered obese.  Maintain normal blood lipids and cholesterol by exercising and minimizing your intake of saturated fat. Eat a balanced diet with plenty of fruits and vegetables. Blood tests for lipids and cholesterol should begin at age 38 and be repeated every 5 years. If your lipid or cholesterol levels are high, you are over age 36, or you are at high risk for heart disease, you may need your cholesterol  levels checked more frequently.Ongoing high lipid and cholesterol levels should be treated with medicines if diet and exercise are not working.  If you smoke, find out from your health care provider how to quit. If you do not use tobacco, do not start.  Lung cancer screening is recommended for adults aged 73-80 years who are at high risk for developing lung cancer because of a history of smoking. A yearly low-dose CT scan of the lungs is recommended for people who have at least a 30-pack-year history of smoking and are current smokers or have quit within the past 15 years. A pack year of smoking is smoking an average of 1 pack of cigarettes a day for 1 year (for example, a 30-pack-year history of smoking could mean smoking 1 pack a day for 30 years or 2 packs a day for 15 years). Yearly screening should continue until the smoker has stopped smoking for at least 15 years. Yearly screening should be stopped for people who develop a health problem that would prevent them from having lung cancer treatment.  If you choose to drink alcohol, do not have more than 2 drinks per day. One drink is considered to be 12 oz (360 mL) of beer, 5 oz (150 mL) of wine, or 1.5 oz (45 mL) of liquor.  Avoid the use of street drugs. Do not share needles with anyone. Ask for help if you need support or instructions about stopping the use of drugs.  High  blood pressure causes heart disease and increases the risk of stroke. High blood pressure is more likely to develop in:  People who have blood pressure in the end of the normal range (100-139/85-89 mm Hg).  People who are overweight or obese.  People who are African American.  If you are 23-63 years of age, have your blood pressure checked every 3-5 years. If you are 2 years of age or older, have your blood pressure checked every year. You should have your blood pressure measured twice-once when you are at a hospital or clinic, and once when you are not at a hospital or  clinic. Record the average of the two measurements. To check your blood pressure when you are not at a hospital or clinic, you can use:  An automated blood pressure machine at a pharmacy.  A home blood pressure monitor.  If you are 40-68 years old, ask your health care provider if you should take aspirin to prevent heart disease.  Diabetes screening involves taking a blood sample to check your fasting blood sugar level. This should be done once every 3 years after age 46 if you are at a normal weight and without risk factors for diabetes. Testing should be considered at a younger age or be carried out more frequently if you are overweight and have at least 1 risk factor for diabetes.  Colorectal cancer can be detected and often prevented. Most routine colorectal cancer screening begins at the age of 9 and continues through age 80. However, your health care provider may recommend screening at an earlier age if you have risk factors for colon cancer. On a yearly basis, your health care provider may provide home test kits to check for hidden blood in the stool. A small camera at the end of a tube may be used to directly examine the colon (sigmoidoscopy or colonoscopy) to detect the earliest forms of colorectal cancer. Talk to your health care provider about this at age 51 when routine screening begins. A direct exam of the colon should be repeated every 5-10 years through age 61, unless early forms of precancerous polyps or small growths are found.  People who are at an increased risk for hepatitis B should be screened for this virus. You are considered at high risk for hepatitis B if:  You were born in a country where hepatitis B occurs often. Talk with your health care provider about which countries are considered high risk.  Your parents were born in a high-risk country and you have not received a shot to protect against hepatitis B (hepatitis B vaccine).  You have HIV or AIDS.  You use needles  to inject street drugs.  You live with, or have sex with, someone who has hepatitis B.  You are a man who has sex with other men (MSM).  You get hemodialysis treatment.  You take certain medicines for conditions like cancer, organ transplantation, and autoimmune conditions.  Hepatitis C blood testing is recommended for all people born from [birth year] through 1967 and any individual with known risk factors for hepatitis C.  Healthy men should no longer receive prostate-specific antigen (PSA) blood tests as part of routine cancer screening. Talk to your health care provider about prostate cancer screening.  Testicular cancer screening is not recommended for adolescents or adult males who have no symptoms. Screening includes self-exam, a health care provider exam, and other screening tests. Consult with your health care provider about any symptoms you have or any concerns  you have about testicular cancer.  Practice safe sex. Use condoms and avoid high-risk sexual practices to reduce the spread of sexually transmitted infections (STIs).  You should be screened for STIs, including gonorrhea and chlamydia if:  You are sexually active and are younger than 24 years.  You are older than 24 years, and your health care provider tells you that you are at risk for this type of infection.  Your sexual activity has changed since you were last screened, and you are at an increased risk for chlamydia or gonorrhea. Ask your health care provider if you are at risk.  If you are at risk of being infected with HIV, it is recommended that you take a prescription medicine daily to prevent HIV infection. This is called pre-exposure prophylaxis (PrEP). You are considered at risk if:  You are a man who has sex with other men (MSM).  You are a heterosexual man who is sexually active with multiple partners.  You take drugs by injection.  You are sexually active with a partner who has HIV.  Talk with your health  care provider about whether you are at high risk of being infected with HIV. If you choose to begin PrEP, you should first be tested for HIV. You should then be tested every 3 months for as long as you are taking PrEP.  Use sunscreen. Apply sunscreen liberally and repeatedly throughout the day. You should seek shade when your shadow is shorter than you. Protect yourself by wearing long sleeves, pants, a wide-brimmed hat, and sunglasses year round whenever you are outdoors.  Tell your health care provider of new moles or changes in moles, especially if there is a change in shape or color. Also, tell your health care provider if a mole is larger than the size of a pencil eraser.  A one-time screening for abdominal aortic aneurysm (AAA) and surgical repair of large AAAs by ultrasound is recommended for men aged 70-75 years who are current or former smokers.  Stay current with your vaccines (immunizations). This information is not intended to replace advice given to you by your health care provider. Make sure you discuss any questions you have with your health care provider. Document Released: 01/01/2008 Document Revised: 07/26/2014 Document Reviewed: 04/08/2015 Elsevier Interactive Patient Education  2017 [employer].

## 2016-07-13 ENCOUNTER — Encounter: Payer: Self-pay | Admitting: *Deleted

## 2016-07-13 ENCOUNTER — Other Ambulatory Visit (INDEPENDENT_AMBULATORY_CARE_PROVIDER_SITE_OTHER): Payer: Commercial Managed Care - PPO

## 2016-07-13 DIAGNOSIS — G20 Parkinson's disease: Secondary | ICD-10-CM

## 2016-07-13 DIAGNOSIS — N138 Other obstructive and reflux uropathy: Secondary | ICD-10-CM | POA: Diagnosis not present

## 2016-07-13 DIAGNOSIS — N401 Enlarged prostate with lower urinary tract symptoms: Secondary | ICD-10-CM | POA: Diagnosis not present

## 2016-07-13 DIAGNOSIS — I10 Essential (primary) hypertension: Secondary | ICD-10-CM | POA: Diagnosis not present

## 2016-07-13 LAB — LIPID PANEL
CHOLESTEROL: 164 mg/dL (ref 0–200)
HDL: 50.3 mg/dL (ref >39.00)
LDL Cholesterol: 93 mg/dL (ref 0–99)
NonHDL: 114.14
Total CHOL/HDL Ratio: 3
Triglycerides: 108 mg/dL (ref 0.0–149.0)
VLDL: 21.6 mg/dL (ref 0.0–40.0)

## 2016-07-13 LAB — CBC WITH DIFFERENTIAL/PLATELET
BASOS PCT: 0.4 % (ref 0.0–3.0)
Basophils Absolute: 0 10*3/uL (ref 0.0–0.1)
EOS PCT: 1 % (ref 0.0–5.0)
Eosinophils Absolute: 0.1 10*3/uL (ref 0.0–0.7)
HEMATOCRIT: 39.1 % (ref 39.0–52.0)
HEMOGLOBIN: 13.1 g/dL (ref 13.0–17.0)
LYMPHS PCT: 33.4 % (ref 12.0–46.0)
Lymphs Abs: 2.6 10*3/uL (ref 0.7–4.0)
MCHC: 33.5 g/dL (ref 30.0–36.0)
MCV: 86.1 fl (ref 78.0–100.0)
MONO ABS: 0.7 10*3/uL (ref 0.1–1.0)
MONOS PCT: 8.8 % (ref 3.0–12.0)
Neutro Abs: 4.4 10*3/uL (ref 1.4–7.7)
Neutrophils Relative %: 56.4 % (ref 43.0–77.0)
Platelets: 269 10*3/uL (ref 150.0–400.0)
RBC: 4.55 Mil/uL (ref 4.22–5.81)
RDW: 14.5 % (ref 11.5–15.5)
WBC: 7.8 10*3/uL (ref 4.0–10.5)

## 2016-07-13 LAB — BASIC METABOLIC PANEL
BUN: 21 mg/dL (ref 6–23)
CO2: 33 mEq/L — ABNORMAL HIGH (ref 19–32)
Calcium: 9.7 mg/dL (ref 8.4–10.5)
Chloride: 100 mEq/L (ref 96–112)
Creatinine, Ser: 1.19 mg/dL (ref 0.40–1.50)
GFR: 77.16 mL/min (ref >60.00)
GLUCOSE: 136 mg/dL — AB (ref 70–99)
POTASSIUM: 3.6 meq/L (ref 3.5–5.1)
SODIUM: 141 meq/L (ref 135–145)

## 2016-07-13 LAB — PSA: PSA: 2.81 ng/mL (ref 0.10–4.00)

## 2016-07-22 ENCOUNTER — Other Ambulatory Visit: Payer: Self-pay

## 2016-07-22 MED ORDER — HYDROCODONE-ACETAMINOPHEN 5-325 MG PO TABS: 0.5000 | ORAL_TABLET | Freq: Two times a day (BID) | ORAL | 0 refills | Status: DC | PRN

## 2016-07-22 NOTE — Telephone Encounter (Signed)
Pt left v/m requesting rx hydrocodone apap. Call when ready for pick up. rx last printed # 30 on 06/18/16. Pt last seen 07/08/16.

## 2016-07-22 NOTE — Telephone Encounter (Signed)
Attempted to call patient. No answer and mailbox was full, so unable to leave message. Rx placed up front for pick up.

## 2016-07-22 NOTE — Telephone Encounter (Signed)
Printed and in Kim's box 

## 2016-09-06 ENCOUNTER — Encounter: Payer: Self-pay | Admitting: Neurology

## 2016-09-06 ENCOUNTER — Telehealth: Payer: Self-pay | Admitting: Neurology

## 2016-09-06 ENCOUNTER — Other Ambulatory Visit: Payer: Self-pay | Admitting: Family Medicine

## 2016-09-06 DIAGNOSIS — G20 Parkinson's disease: Secondary | ICD-10-CM

## 2016-09-06 MED ORDER — HYDROCODONE-ACETAMINOPHEN 5-325 MG PO TABS: 0.5000 | ORAL_TABLET | Freq: Two times a day (BID) | ORAL | 0 refills | Status: DC | PRN

## 2016-09-06 MED ORDER — CARBIDOPA-LEVODOPA 25-100 MG PO TABS: ORAL_TABLET | ORAL | 1 refills | Status: DC

## 2016-09-06 NOTE — Telephone Encounter (Signed)
Prescription sent to pharmacy.

## 2016-09-06 NOTE — Telephone Encounter (Signed)
Pt left v/m requesting rx hydrocodone apap. Call when ready for pick up. rx last printed # 30 on 07/22/16. Pt last seen 07/08/16.

## 2016-09-06 NOTE — Telephone Encounter (Signed)
I called the pt and informed him the Rx was left at the front desk for pick up. 

## 2016-09-06 NOTE — Telephone Encounter (Signed)
Brandon Manning 06/01/1946. His wife Yugo Doren called [phone]. He is needing a refill on his medication Carbidopa Levodopa 25- 100 mg? You may need to double check with him on the medication  name.  He uses the [facility] on [address].  Thank you

## 2016-09-06 NOTE — Telephone Encounter (Signed)
Printed and in Kim's box 
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
## 2016-10-01 ENCOUNTER — Encounter: Payer: Self-pay | Admitting: Neurology

## 2016-10-01 ENCOUNTER — Ambulatory Visit (INDEPENDENT_AMBULATORY_CARE_PROVIDER_SITE_OTHER): Payer: Commercial Managed Care - PPO | Admitting: Neurology

## 2016-10-01 VITALS — BP 108/60 | HR 88 | Ht 69.0 in | Wt 188.0 lb

## 2016-10-01 DIAGNOSIS — F0280 Dementia in other diseases classified elsewhere without behavioral disturbance: Secondary | ICD-10-CM | POA: Diagnosis not present

## 2016-10-01 DIAGNOSIS — G20A1 Parkinson's disease without dyskinesia, without mention of fluctuations: Secondary | ICD-10-CM

## 2016-10-01 DIAGNOSIS — G20 Parkinson's disease: Secondary | ICD-10-CM | POA: Diagnosis not present

## 2016-10-01 MED ORDER — CARBIDOPA-LEVODOPA 25-100 MG PO TABS: ORAL_TABLET | ORAL | 1 refills | Status: DC

## 2016-10-08 DIAGNOSIS — H25013 Cortical age-related cataract, bilateral: Secondary | ICD-10-CM | POA: Diagnosis not present

## 2016-10-08 DIAGNOSIS — H2513 Age-related nuclear cataract, bilateral: Secondary | ICD-10-CM | POA: Diagnosis not present

## 2016-10-08 DIAGNOSIS — H524 Presbyopia: Secondary | ICD-10-CM | POA: Diagnosis not present

## 2016-10-13 ENCOUNTER — Other Ambulatory Visit: Payer: Self-pay

## 2016-10-13 MED ORDER — HYDROCODONE-ACETAMINOPHEN 5-325 MG PO TABS: 0.5000 | ORAL_TABLET | Freq: Two times a day (BID) | ORAL | 0 refills | Status: DC | PRN

## 2016-10-13 NOTE — Telephone Encounter (Signed)
Patient notified and Rx placed up front for pick up. 

## 2016-10-13 NOTE — Telephone Encounter (Signed)
Printed and in [employer].

## 2016-10-13 NOTE — Telephone Encounter (Signed)
Pt left v/m requesting rx hydrocodone apap. Call when ready for pick up. rx last printed # 30 on 09/06/16; last seen annual 07/08/16.

## 2016-12-07 ENCOUNTER — Other Ambulatory Visit: Payer: Self-pay

## 2016-12-07 MED ORDER — HYDROCODONE-ACETAMINOPHEN 5-325 MG PO TABS: 0.5000 | ORAL_TABLET | Freq: Two times a day (BID) | ORAL | 0 refills | Status: DC | PRN

## 2016-12-07 NOTE — Telephone Encounter (Signed)
VM box full. Unable to leave message.  RX @ front office

## 2016-12-07 NOTE — Telephone Encounter (Signed)
Printed and in CMA box 

## 2016-12-07 NOTE — Telephone Encounter (Signed)
Pt left v/m requesting rx hydrocodone apap. Call when ready for pick up. Last printed # 30 on 10/13/16. Last seen 07/08/16.

## 2017-01-06 ENCOUNTER — Ambulatory Visit: Payer: Commercial Managed Care - PPO | Admitting: Family Medicine

## 2017-01-07 ENCOUNTER — Ambulatory Visit: Payer: Commercial Managed Care - PPO | Admitting: Family Medicine

## 2017-01-07 ENCOUNTER — Ambulatory Visit (INDEPENDENT_AMBULATORY_CARE_PROVIDER_SITE_OTHER): Payer: Commercial Managed Care - PPO | Admitting: Family Medicine

## 2017-01-07 ENCOUNTER — Encounter: Payer: Self-pay | Admitting: Family Medicine

## 2017-01-07 VITALS — BP 104/70 | HR 76 | Wt 182.0 lb

## 2017-01-07 DIAGNOSIS — K5909 Other constipation: Secondary | ICD-10-CM

## 2017-01-07 DIAGNOSIS — M545 Low back pain: Secondary | ICD-10-CM | POA: Diagnosis not present

## 2017-01-07 DIAGNOSIS — Z1211 Encounter for screening for malignant neoplasm of colon: Secondary | ICD-10-CM | POA: Diagnosis not present

## 2017-01-07 DIAGNOSIS — I10 Essential (primary) hypertension: Secondary | ICD-10-CM

## 2017-01-07 DIAGNOSIS — G8929 Other chronic pain: Secondary | ICD-10-CM

## 2017-01-07 DIAGNOSIS — G20 Parkinson's disease: Secondary | ICD-10-CM | POA: Diagnosis not present

## 2017-01-07 MED ORDER — HYDROCODONE-ACETAMINOPHEN 5-325 MG PO TABS: 0.5000 | ORAL_TABLET | Freq: Two times a day (BID) | ORAL | 0 refills | Status: DC | PRN

## 2017-01-07 NOTE — Assessment & Plan Note (Signed)
Discussed with pt/wife -controlled substance agreement UDS. Discussed pros and cons of controlled substances and expectations to receive prescription from our office. Patient is not to abuse, misuse, divert or use medication other than as prescribed. Patient is not to seek controlled substances from other clinics or multiple pharmacies. Patient is not to use illegal drugs. Discussed recommended short term use of med. Discussed risks of medication including dependence, tolerance, and addiction/abuse potential. Patient will establish or update controlled substance agreement and complete urine drug screen - just voided, will return for this in the next few weeks.

## 2017-01-07 NOTE — Progress Notes (Signed)
BP 104/70   Pulse 76   Wt 182 lb (82.6 kg)   SpO2 98%   BMI 26.88 kg/m    CC: 41mo f/u visit Subjective:    Patient ID: Brandon Manning, male    DOB: 08-06-1945, 71 y.o.   MRN: [id number]  HPI: Brandon Manning is a 71 y.o. male presenting on 01/07/2017 for Follow-up (pt reports he had a eye exam and was prescribe new glasses. ) and Medication Refill   Established 2017 with Dr Tat for parkinson's disease, started on sinemet IR 25/100mg  TID (2/1/1). Noted significant improvement on medication.   New eye exam with [facility].  Stays active walking outdoors in the shade.  Constipation controlled with miralax QOD. Voiding well with flomax.  Ongoing hydrocodone use - discussed PRN use for chronic lower back pain  Denies hypotensive symptoms. Checks bp at home - well controlled.   He decided not to do cologuard. He decided to undergo colonoscopy in its place. They will schedule later this year.   Relevant past medical, surgical, family and social history reviewed and updated as indicated. Interim medical history since our last visit reviewed. Allergies and medications reviewed and updated. Outpatient Medications Prior to Visit  Medication Sig Dispense Refill  . aspirin EC 81 MG tablet Take 81 mg by mouth every other day.    . carbidopa-levodopa (SINEMET IR) 25-100 MG tablet 2 tablet at 7 am, 1 at 12 pm, 1 at 5 pm 360 tablet 1  . hydrochlorothiazide (MICROZIDE) 12.5 MG capsule Take 1 capsule (12.5 mg total) by mouth daily. 90 capsule 3  . Multiple Vitamins-Minerals (MULTIVITAMIN PO) Take 1 tablet by mouth daily.    . polyethylene glycol powder (MIRALAX) powder Take 255 g by mouth once. 500 g 0  . tamsulosin (FLOMAX) 0.4 MG CAPS capsule Take 1 capsule (0.4 mg total) by mouth daily after supper. 90 capsule 3  . HYDROcodone-acetaminophen (NORCO/VICODIN) 5-325 MG tablet Take 0.5 tablets by mouth 2 (two) times daily as needed for moderate pain. 30 tablet 0   No facility-administered  medications prior to visit.      Per HPI unless specifically indicated in ROS section below Review of Systems     Objective:    BP 104/70   Pulse 76   Wt 182 lb (82.6 kg)   SpO2 98%   BMI 26.88 kg/m   Wt Readings from Last 3 Encounters:  01/07/17 182 lb (82.6 kg)  10/01/16 188 lb (85.3 kg)  07/08/16 188 lb 4 oz (85.4 kg)    BP Readings from Last 3 Encounters:  01/07/17 104/70  10/01/16 108/60  07/08/16 118/84    Physical Exam  Constitutional: He appears well-developed and well-nourished. No distress.  HENT:  Mouth/Throat: Oropharynx is clear and moist. No oropharyngeal exudate.  Cardiovascular: Normal rate, regular rhythm, normal heart sounds and intact distal pulses.   No murmur heard. Pulmonary/Chest: Effort normal and breath sounds normal. No respiratory distress. He has no wheezes. He has no rales.  Musculoskeletal: He exhibits no edema.  Neurological:  Slowed gait and responses, more expressive face today than previously  Skin: Skin is warm and dry.  Nursing note and vitals reviewed.  Results for orders placed or performed in visit on 07/13/16  Lipid panel  Result Value Ref Range   Cholesterol 164 0 - 200 mg/dL   Triglycerides 108.0 0.0 - 149.0 mg/dL   HDL 50.30 >39.00 mg/dL   VLDL 21.6 0.0 - 40.0 mg/dL   LDL Cholesterol  93 0 - 99 mg/dL   Total CHOL/HDL Ratio 3    NonHDL 114.14   CBC with Differential/Platelet  Result Value Ref Range   WBC 7.8 4.0 - 10.5 K/uL   RBC 4.55 4.22 - 5.81 Mil/uL   Hemoglobin 13.1 13.0 - 17.0 g/dL   HCT 39.1 39.0 - 52.0 %   MCV 86.1 78.0 - 100.0 fl   MCHC 33.5 30.0 - 36.0 g/dL   RDW 14.5 11.5 - 15.5 %   Platelets 269.0 150.0 - 400.0 K/uL   Neutrophils Relative % 56.4 43.0 - 77.0 %   Lymphocytes Relative 33.4 12.0 - 46.0 %   Monocytes Relative 8.8 3.0 - 12.0 %   Eosinophils Relative 1.0 0.0 - 5.0 %   Basophils Relative 0.4 0.0 - 3.0 %   Neutro Abs 4.4 1.4 - 7.7 K/uL   Lymphs Abs 2.6 0.7 - 4.0 K/uL   Monocytes Absolute 0.7  0.1 - 1.0 K/uL   Eosinophils Absolute 0.1 0.0 - 0.7 K/uL   Basophils Absolute 0.0 0.0 - 0.1 K/uL  Basic metabolic panel  Result Value Ref Range   Sodium 141 135 - 145 mEq/L   Potassium 3.6 3.5 - 5.1 mEq/L   Chloride 100 96 - 112 mEq/L   CO2 33 (H) 19 - 32 mEq/L   Glucose, Bld 136 (H) 70 - 99 mg/dL   BUN 21 6 - 23 mg/dL   Creatinine, Ser 1.19 0.40 - 1.50 mg/dL   Calcium 9.7 8.4 - 10.5 mg/dL   GFR 77.16 >60.00 mL/min  PSA  Result Value Ref Range   PSA 2.81 0.10 - 4.00 ng/mL      Assessment & Plan:   Problem List Items Addressed This Visit    Bilateral low back pain without sciatica    Continue PRN 1/2 tab hydrocodone. Reviewed controlled substance agreement.       Relevant Medications   HYDROcodone-acetaminophen (NORCO/VICODIN) 5-325 MG tablet   Chronic constipation    Controlled with miralax ~QOD       Encounter for chronic pain management    Discussed with pt/wife -controlled substance agreement UDS. Discussed pros and cons of controlled substances and expectations to receive prescription from our office. Patient is not to abuse, misuse, divert or use medication other than as prescribed. Patient is not to seek controlled substances from other clinics or multiple pharmacies. Patient is not to use illegal drugs. Discussed recommended short term use of med. Discussed risks of medication including dependence, tolerance, and addiction/abuse potential. Patient will establish or update controlled substance agreement and complete urine drug screen - just voided, will return for this in the next few weeks.         HTN (hypertension)    bp actually low today. Reviewed with pt/wife. Discussed reasons to hold hctz. They deny hypotensive symptoms at this time.       Parkinson disease ([place]) - Primary    Appreciate excellent neurology care. Continue f/u with Dr Tat.       Other Visit Diagnoses    Special screening for malignant neoplasms, colon       Relevant Orders   Ambulatory  referral to Gastroenterology       Follow up plan: Return in about 6 months (around 07/09/2017) for annual exam, prior fasting for blood work, medicare wellness visit.  [name], MD

## 2017-01-07 NOTE — Patient Instructions (Addendum)
Goal blood pressure 110-130/70-80. If staying below this, try holding blood pressure medicine (hydrochlorothiazide).  You are doing well today.  Return as needed or in 6 months for medicare wellness and physical.  Return at your convenience for controlled substance agreement form and urine screen.

## 2017-01-07 NOTE — Assessment & Plan Note (Signed)
Controlled with miralax ~QOD

## 2017-01-07 NOTE — Assessment & Plan Note (Signed)
Appreciate excellent neurology care. Continue f/u with Dr Tat.

## 2017-01-07 NOTE — Progress Notes (Signed)
Pre visit review using our clinic review tool, if applicable. No additional management support is needed unless otherwise documented below in the visit note. 

## 2017-01-07 NOTE — Assessment & Plan Note (Signed)
Continue PRN 1/2 tab hydrocodone. Reviewed controlled substance agreement.

## 2017-01-07 NOTE — Assessment & Plan Note (Signed)
bp actually low today. Reviewed with pt/wife. Discussed reasons to hold hctz. They deny hypotensive symptoms at this time.

## 2017-01-25 ENCOUNTER — Inpatient Hospital Stay
Admission: EM | Admit: 2017-01-25 | Discharge: 2017-01-28 | DRG: 312 | Disposition: A | Discharge status: Home Health | Payer: Medicare Other | Attending: Internal Medicine | Admitting: Internal Medicine

## 2017-01-25 ENCOUNTER — Telehealth: Payer: Self-pay | Admitting: Family Medicine

## 2017-01-25 ENCOUNTER — Emergency Department: Payer: Medicare Other

## 2017-01-25 ENCOUNTER — Telehealth: Payer: Self-pay | Admitting: Neurology

## 2017-01-25 ENCOUNTER — Encounter: Payer: Self-pay | Admitting: Internal Medicine

## 2017-01-25 DIAGNOSIS — R55 Syncope and collapse: Secondary | ICD-10-CM

## 2017-01-25 DIAGNOSIS — Z841 Family history of disorders of kidney and ureter: Secondary | ICD-10-CM

## 2017-01-25 DIAGNOSIS — E860 Dehydration: Secondary | ICD-10-CM | POA: Diagnosis present

## 2017-01-25 DIAGNOSIS — I10 Essential (primary) hypertension: Secondary | ICD-10-CM | POA: Diagnosis present

## 2017-01-25 DIAGNOSIS — G20 Parkinson's disease: Secondary | ICD-10-CM | POA: Diagnosis present

## 2017-01-25 DIAGNOSIS — G3184 Mild cognitive impairment, so stated: Secondary | ICD-10-CM | POA: Diagnosis present

## 2017-01-25 DIAGNOSIS — K8020 Calculus of gallbladder without cholecystitis without obstruction: Secondary | ICD-10-CM | POA: Diagnosis not present

## 2017-01-25 DIAGNOSIS — I951 Orthostatic hypotension: Principal | ICD-10-CM | POA: Diagnosis present

## 2017-01-25 DIAGNOSIS — Z833 Family history of diabetes mellitus: Secondary | ICD-10-CM

## 2017-01-25 DIAGNOSIS — Z87891 Personal history of nicotine dependence: Secondary | ICD-10-CM

## 2017-01-25 DIAGNOSIS — Z79899 Other long term (current) drug therapy: Secondary | ICD-10-CM

## 2017-01-25 DIAGNOSIS — Z880 Allergy status to penicillin: Secondary | ICD-10-CM

## 2017-01-25 DIAGNOSIS — E876 Hypokalemia: Secondary | ICD-10-CM | POA: Diagnosis not present

## 2017-01-25 DIAGNOSIS — N400 Enlarged prostate without lower urinary tract symptoms: Secondary | ICD-10-CM | POA: Diagnosis present

## 2017-01-25 DIAGNOSIS — Z7982 Long term (current) use of aspirin: Secondary | ICD-10-CM

## 2017-01-25 LAB — CBC WITH DIFFERENTIAL/PLATELET
Basophils Absolute: 0 10*3/uL (ref 0–0.1)
Basophils Relative: 1 %
Eosinophils Absolute: 0.1 10*3/uL (ref 0–0.7)
Eosinophils Relative: 1 %
HCT: 39.2 % — ABNORMAL LOW (ref 40.0–52.0)
HEMOGLOBIN: 13.1 g/dL (ref 13.0–18.0)
LYMPHS ABS: 3.5 10*3/uL (ref 1.0–3.6)
LYMPHS PCT: 37 %
MCH: 28.2 pg (ref 26.0–34.0)
MCHC: 33.3 g/dL (ref 32.0–36.0)
MCV: 84.9 fL (ref 80.0–100.0)
MONOS PCT: 9 %
Monocytes Absolute: 0.9 10*3/uL (ref 0.2–1.0)
NEUTROS PCT: 52 %
Neutro Abs: 5.1 10*3/uL (ref 1.4–6.5)
Platelets: 266 10*3/uL (ref 150–440)
RBC: 4.62 MIL/uL (ref 4.40–5.90)
RDW: 14.1 % (ref 11.5–14.5)
WBC: 9.7 10*3/uL (ref 3.8–10.6)

## 2017-01-25 LAB — COMPREHENSIVE METABOLIC PANEL
ALK PHOS: 49 U/L (ref 38–126)
AST: 19 U/L (ref 15–41)
Albumin: 3.9 g/dL (ref 3.5–5.0)
Anion gap: 9 (ref 5–15)
BILIRUBIN TOTAL: 1.2 mg/dL (ref 0.3–1.2)
BUN: 20 mg/dL (ref 6–20)
CALCIUM: 9.5 mg/dL (ref 8.9–10.3)
CO2: 30 mmol/L (ref 22–32)
CREATININE: 1.34 mg/dL — AB (ref 0.61–1.24)
Chloride: 101 mmol/L (ref 101–111)
GFR, EST AFRICAN AMERICAN: 59 mL/min — AB (ref >60)
GFR, EST NON AFRICAN AMERICAN: 51 mL/min — AB (ref >60)
Glucose, Bld: 104 mg/dL — ABNORMAL HIGH (ref 65–99)
Potassium: 3.2 mmol/L — ABNORMAL LOW (ref 3.5–5.1)
Sodium: 140 mmol/L (ref 135–145)
TOTAL PROTEIN: 7.3 g/dL (ref 6.5–8.1)

## 2017-01-25 LAB — URINALYSIS, COMPLETE (UACMP) WITH MICROSCOPIC
Bacteria, UA: NONE SEEN
Bilirubin Urine: NEGATIVE
Glucose, UA: NEGATIVE mg/dL
KETONES UR: 5 mg/dL — AB
Leukocytes, UA: NEGATIVE
Nitrite: NEGATIVE
PH: 7 (ref 5.0–8.0)
Protein, ur: NEGATIVE mg/dL
SPECIFIC GRAVITY, URINE: 1.017 (ref 1.005–1.030)

## 2017-01-25 LAB — TROPONIN I

## 2017-01-25 MED ORDER — HYDROCODONE-ACETAMINOPHEN 5-325 MG PO TABS
0.5000 | ORAL_TABLET | Freq: Two times a day (BID) | ORAL | Status: DC | PRN
  Administered 2017-01-26 – 2017-01-27 (×2): 0.5 via ORAL
  Filled 2017-01-25 (×2): qty 1

## 2017-01-25 MED ORDER — SODIUM CHLORIDE 0.9 % IV BOLUS (SEPSIS)
1000.0000 mL | Freq: Once | INTRAVENOUS | Status: AC
  Administered 2017-01-25: 1000 mL via INTRAVENOUS

## 2017-01-25 MED ORDER — ONDANSETRON HCL 4 MG PO TABS: 4.0000 mg | ORAL_TABLET | Freq: Four times a day (QID) | ORAL | Status: DC | PRN

## 2017-01-25 MED ORDER — POTASSIUM CHLORIDE IN NACL 20-0.9 MEQ/L-% IV SOLN
INTRAVENOUS | Status: DC
  Administered 2017-01-25 – 2017-01-26 (×2): via INTRAVENOUS
  Filled 2017-01-25 (×3): qty 1000

## 2017-01-25 MED ORDER — AMLODIPINE BESYLATE 5 MG PO TABS
ORAL_TABLET | ORAL | Status: AC
  Filled 2017-01-25: qty 1

## 2017-01-25 MED ORDER — TAMSULOSIN HCL 0.4 MG PO CAPS
0.4000 mg | ORAL_CAPSULE | Freq: Every day | ORAL | Status: DC
  Administered 2017-01-25: 0.4 mg via ORAL
  Filled 2017-01-25: qty 1

## 2017-01-25 MED ORDER — ALBUTEROL SULFATE (2.5 MG/3ML) 0.083% IN NEBU
2.5000 mg | INHALATION_SOLUTION | RESPIRATORY_TRACT | Status: DC | PRN
Start: 2017-01-25 — End: 2017-01-28

## 2017-01-25 MED ORDER — CARBIDOPA-LEVODOPA 25-100 MG PO TABS
1.0000 | ORAL_TABLET | Freq: Two times a day (BID) | ORAL | Status: DC
  Administered 2017-01-25 – 2017-01-28 (×6): 1 via ORAL
  Filled 2017-01-25 (×8): qty 1

## 2017-01-25 MED ORDER — POTASSIUM CHLORIDE CRYS ER 20 MEQ PO TBCR
40.0000 meq | EXTENDED_RELEASE_TABLET | ORAL | Status: AC
  Administered 2017-01-25 – 2017-01-26 (×2): 40 meq via ORAL
  Filled 2017-01-25 (×2): qty 2

## 2017-01-25 MED ORDER — ACETAMINOPHEN 650 MG RE SUPP: 650.0000 mg | Freq: Four times a day (QID) | RECTAL | Status: DC | PRN

## 2017-01-25 MED ORDER — ASPIRIN EC 81 MG PO TBEC
81.0000 mg | DELAYED_RELEASE_TABLET | ORAL | Status: DC
  Administered 2017-01-27: 09:00:00 81 mg via ORAL
  Filled 2017-01-25: qty 1

## 2017-01-25 MED ORDER — POLYETHYLENE GLYCOL 3350 17 G PO PACK
17.0000 g | PACK | Freq: Every day | ORAL | Status: DC | PRN
  Administered 2017-01-27 (×2): 17 g via ORAL
  Filled 2017-01-25 (×2): qty 1

## 2017-01-25 MED ORDER — ONDANSETRON HCL 4 MG/2ML IJ SOLN: 4.0000 mg | Freq: Four times a day (QID) | INTRAMUSCULAR | Status: DC | PRN

## 2017-01-25 MED ORDER — ACETAMINOPHEN 325 MG PO TABS: 650.0000 mg | ORAL_TABLET | Freq: Four times a day (QID) | ORAL | Status: DC | PRN

## 2017-01-25 MED ORDER — CARBIDOPA-LEVODOPA 25-100 MG PO TABS
2.0000 | ORAL_TABLET | Freq: Every day | ORAL | Status: DC
  Administered 2017-01-26 – 2017-01-28 (×3): 2 via ORAL
  Filled 2017-01-25 (×3): qty 2

## 2017-01-25 MED ORDER — AMLODIPINE BESYLATE 5 MG PO TABS
5.0000 mg | ORAL_TABLET | Freq: Every day | ORAL | Status: DC
  Administered 2017-01-25: 5 mg via ORAL

## 2017-01-25 MED ORDER — ENOXAPARIN SODIUM 40 MG/0.4ML ~~LOC~~ SOLN
40.0000 mg | SUBCUTANEOUS | Status: DC
  Administered 2017-01-25 – 2017-01-27 (×3): 40 mg via SUBCUTANEOUS
  Filled 2017-01-25 (×3): qty 0.4

## 2017-01-25 NOTE — Telephone Encounter (Signed)
Patient wife called and states that patient is dizzy and feels like he is going to pass out and would like patient to be seen today by Dr Tat the family Dr can not see patient today so she called [place]. She states that if she has not heard anything in 30 mins she will call back

## 2017-01-25 NOTE — ED Triage Notes (Signed)
Pt was going into [facility] and passed out - lowered to the ground. Lightheaded x 2 days.

## 2017-01-25 NOTE — ED Notes (Signed)
Aware of need for urine
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
## 2017-01-25 NOTE — Telephone Encounter (Signed)
Called and spoke with patient's wife. She states patient has been dizzy all day. His blood pressure reading was 100/78 sitting. He has been drinking plenty of water. He has not passed out but has told his wife he feels like he might.  They called their PCP, Dr. [name], and they did not have any openings today.  Patient's wife made aware we have not treated him for dizziness before and there are multiple reasons for dizziness that are not neurologically related. I advised Urgent Care/ER if they could not get in to see PCP.  She expressed understanding.

## 2017-01-25 NOTE — ED Notes (Signed)
Patient transported to X-ray 

## 2017-01-25 NOTE — Telephone Encounter (Signed)
Per chart review tab pt went to [facility] ED. 

## 2017-01-25 NOTE — Telephone Encounter (Signed)
Patient's wife called back. Thanks
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
## 2017-01-25 NOTE — ED Notes (Signed)
Pt remains in radiology 

## 2017-01-25 NOTE — Telephone Encounter (Signed)
Patient Name: Brandon Manning  DOB: 1945-08-27    Initial Comment husband dizzy, zip: [phone]   Nurse Assessment  Nurse: Leilani Merl, RN, Heather Date/Time Eilene Ghazi Time): 01/25/2017 1:59:54 PM  Confirm and document reason for call. If symptomatic, describe symptoms. ---Caller states that her husband started with dizziness this morning after he ate his breakfast  Does the patient have any new or worsening symptoms? ---Yes  Will a triage be completed? ---Yes  Related visit to physician within the last 2 weeks? ---No  Does the PT have any chronic conditions? (i.e. diabetes, asthma, etc.) ---Yes  List chronic conditions. ---See MR  Is this a behavioral health or substance abuse call? ---No     Guidelines    Guideline Title Affirmed Question Affirmed Notes  Dizziness - Lightheadedness SEVERE dizziness (e.g., unable to stand, requires support to walk, feels like passing out now)    Final Disposition User   Go to ED Now (or PCP triage) Leilani Merl, RN, Heather    Referrals  GO TO FACILITY UNDECIDED   Disagree/Comply: Comply

## 2017-01-26 DIAGNOSIS — I10 Essential (primary) hypertension: Secondary | ICD-10-CM | POA: Diagnosis present

## 2017-01-26 DIAGNOSIS — Z841 Family history of disorders of kidney and ureter: Secondary | ICD-10-CM | POA: Diagnosis not present

## 2017-01-26 DIAGNOSIS — Z880 Allergy status to penicillin: Secondary | ICD-10-CM | POA: Diagnosis not present

## 2017-01-26 DIAGNOSIS — I951 Orthostatic hypotension: Secondary | ICD-10-CM | POA: Diagnosis not present

## 2017-01-26 DIAGNOSIS — Z79899 Other long term (current) drug therapy: Secondary | ICD-10-CM | POA: Diagnosis not present

## 2017-01-26 DIAGNOSIS — E860 Dehydration: Secondary | ICD-10-CM | POA: Diagnosis present

## 2017-01-26 DIAGNOSIS — G20 Parkinson's disease: Secondary | ICD-10-CM | POA: Diagnosis not present

## 2017-01-26 DIAGNOSIS — R55 Syncope and collapse: Secondary | ICD-10-CM | POA: Diagnosis present

## 2017-01-26 DIAGNOSIS — R531 Weakness: Secondary | ICD-10-CM | POA: Diagnosis not present

## 2017-01-26 DIAGNOSIS — Z87891 Personal history of nicotine dependence: Secondary | ICD-10-CM | POA: Diagnosis not present

## 2017-01-26 DIAGNOSIS — E876 Hypokalemia: Secondary | ICD-10-CM | POA: Diagnosis not present

## 2017-01-26 DIAGNOSIS — N400 Enlarged prostate without lower urinary tract symptoms: Secondary | ICD-10-CM | POA: Diagnosis not present

## 2017-01-26 DIAGNOSIS — Z7982 Long term (current) use of aspirin: Secondary | ICD-10-CM | POA: Diagnosis not present

## 2017-01-26 DIAGNOSIS — G3184 Mild cognitive impairment, so stated: Secondary | ICD-10-CM | POA: Diagnosis present

## 2017-01-26 DIAGNOSIS — Z833 Family history of diabetes mellitus: Secondary | ICD-10-CM | POA: Diagnosis not present

## 2017-01-26 LAB — BASIC METABOLIC PANEL
ANION GAP: 6 (ref 5–15)
BUN: 17 mg/dL (ref 6–20)
CO2: 28 mmol/L (ref 22–32)
Calcium: 8.9 mg/dL (ref 8.9–10.3)
Chloride: 109 mmol/L (ref 101–111)
Creatinine, Ser: 1.19 mg/dL (ref 0.61–1.24)
GFR, EST NON AFRICAN AMERICAN: 59 mL/min — AB (ref >60)
GLUCOSE: 107 mg/dL — AB (ref 65–99)
POTASSIUM: 3.7 mmol/L (ref 3.5–5.1)
Sodium: 143 mmol/L (ref 135–145)

## 2017-01-26 MED ORDER — MIDODRINE HCL 2.5 MG PO TABS
5.0000 mg | ORAL_TABLET | Freq: Three times a day (TID) | ORAL | Status: DC
  Administered 2017-01-26: 17:00:00 5 mg via ORAL
  Filled 2017-01-26 (×2): qty 2
  Filled 2017-01-26: qty 1

## 2017-01-26 MED ORDER — FINASTERIDE 5 MG PO TABS
5.0000 mg | ORAL_TABLET | Freq: Every day | ORAL | Status: DC
  Administered 2017-01-26 – 2017-01-28 (×3): 5 mg via ORAL
  Filled 2017-01-26 (×3): qty 1

## 2017-01-26 MED ORDER — SODIUM CHLORIDE 0.9 % IV SOLN
INTRAVENOUS | Status: DC
  Administered 2017-01-26 – 2017-01-27 (×2): via INTRAVENOUS

## 2017-01-26 NOTE — Evaluation (Signed)
Physical Therapy Evaluation Patient Details Name: Brandon Manning MRN: [id number] DOB: Oct 11, 1945 Today's Date: 01/26/2017   History of Present Illness  Brandon Manning is a 71 y.o. male with a known history of hypertension, Parkinson's disease presented to the emergency room from [facility] urgent care clinic where he passed out. Patient has been feeling lightheaded and dizzy for one day now. He was taken to the urgent care after counseling with the primary care physician. Prior to being seen patient passed out and was lowered to the ground. In the ED patient had been found to be hypotensive with systolic of 09/32 initially presently but pressure is elevated at 191/90. He has been drinking plenty of fluids and has no burning with urination. No recent change in medications. Wife reports that they were advised by his neurologist that his PD medications may cause orthostatic hypotension. He reports that he had some lightheadedness earlier this AM when moving from laying to sitting. Orthostatic vitals were obtained this morning and were positive.   Clinical Impression  Pt admitted with above diagnosis. Pt currently with functional limitations due to the deficits listed below (see PT Problem List).  Pt moves slowly but appears to be close to his baseline per wife report. He requires minA+1 to assist with bed mobility and is CGA only for transfers. Pt performs sit to stand multiple times from bed with therapist. He requires continual cues for safe hand placement and requires increased time due to decreased LE power. Pt becomes lightheaded and dizzy once in standing so orthostatic vitals obtained. BP drops from 106/67 to 63/33 from sit to stand. Performed standing marches and elbow flexion x 2 minutes to try raise BP in standing and rechecked vitals. BP is still very hypotensive at 74/45 and pt remains symptomatic. Deferred further ambulation at this time due to safety concerns with patient reporting lightheadedness.  Will continue to follow and attempt ambulation once BP has stabilized and it is safe to do so without concerns for syncope. RN notified for readings and will communicate with MD. Pt has had PT at home in the past and PT will assess for further needs once he is able to ambulate with therapy. Currently patient and wife report no decline in his baseline mobility. Pt will benefit from PT services to address deficits in strength, balance, and mobility in order to return to full function at home.     Follow Up Recommendations No PT follow up;Supervision for mobility/OOB    Equipment Recommendations  None recommended by PT;Other (comment) (TBD further with ambulation at treatment session)    Recommendations for Other Services       Precautions / Restrictions Precautions Precautions: Fall Precaution Comments: Orthostatic hypotension Restrictions Weight Bearing Restrictions: No      Mobility  Bed Mobility Overal bed mobility: Needs Assistance Bed Mobility: Sit to Supine;Supine to Sit     Supine to sit: Min assist Sit to supine: Min assist   General bed mobility comments: Pt requires minA+1 for LE management. Use of bed rails, HOB elevated, and increased time required  Transfers Overall transfer level: Needs assistance Equipment used: Rolling walker (2 wheeled) Transfers: Sit to/from Stand Sit to Stand: Min guard         General transfer comment: Pt performs sit to stand multiple times from bed with therapist. He requires continual cues for safe hand placement and requires increased time due to decreased LE power. Pt becomes lightheaded and dizzy once in standing so orthostatic vitals obtained. BP drops  from 106/67 to 63/33 from sit to stand. Performed standing marches and elbow flexion x 2 minutes to try raise BP in standing and rechecked. BP is still very hypotensive at 74/45 and pt remains symptomatic. Deferred further ambulation at this time  Ambulation/Gait              General Gait Details: Deferred ambulation at this time due to symptomatic hypotension in standing  Stairs            Wheelchair Mobility    Modified Rankin (Stroke Patients Only)       Balance Overall balance assessment: Needs assistance Sitting-balance support: No upper extremity supported Sitting balance-Leahy Scale: Good     Standing balance support: No upper extremity supported Standing balance-Leahy Scale: Fair Standing balance comment: Able to maintain balance without UE support in standing                             Pertinent Vitals/Pain Pain Assessment: No/denies pain    Home Living Family/patient expects to be discharged to:: Private residence Living Arrangements: Spouse/significant other Available Help at Discharge: Family Type of Home: House Home Access: Stairs to enter Entrance Stairs-Rails: None Entrance Stairs-Number of Steps: 1 Home Layout: One level Home Equipment: Shower seat (No walker or cane per wife, lift chair)      Prior Function Level of Independence: Needs assistance   Gait / Transfers Assistance Needed: ambulates without assistive device. Recent fall prior to admission due to syncope is only fall in the last 12 months.  ADL's / Homemaking Assistance Needed: Mostly independent with ADLs, requires intermittnet assist from wife with bathing. Requires assist with IADLs        Hand Dominance   Dominant Hand: Left    Extremity/Trunk Assessment   Upper Extremity Assessment Upper Extremity Assessment: Generalized weakness    Lower Extremity Assessment Lower Extremity Assessment: Generalized weakness       Communication   Communication: HOH  Cognition Arousal/Alertness: Awake/alert Behavior During Therapy: WFL for tasks assessed/performed Overall Cognitive Status: History of cognitive impairments - at baseline                                 General Comments: AOx2, disoriented to year but oriented  to month      General Comments      Exercises     Assessment/Plan    PT Assessment Patient needs continued PT services  PT Problem List Decreased strength;Decreased balance;Decreased activity tolerance       PT Treatment Interventions DME instruction;Gait training;Stair training;Functional mobility training;Therapeutic activities;Therapeutic exercise;Balance training;Neuromuscular re-education;Cognitive remediation;Patient/family education    PT Goals (Current goals can be found in the Care Plan section)  Acute Rehab PT Goals Patient Stated Goal: "I want to go home." PT Goal Formulation: With patient/family Time For Goal Achievement: 02/09/17 Potential to Achieve Goals: Good    Frequency Min 2X/week   Barriers to discharge   Orthostatic hypotension    Co-evaluation               AM-PAC PT "6 Clicks" Daily Activity  Outcome Measure Difficulty turning over in bed (including adjusting bedclothes, sheets and blankets)?: A Little Difficulty moving from lying on back to sitting on the side of the bed? : Total Difficulty sitting down on and standing up from a chair with arms (e.g., wheelchair, bedside commode, etc,.)?: A Little Help needed  moving to and from a bed to chair (including a wheelchair)?: A Lot Help needed walking in hospital room?: A Lot Help needed climbing 3-5 steps with a railing? : A Lot 6 Click Score: 13    End of Session Equipment Utilized During Treatment: Gait belt Activity Tolerance: Treatment limited secondary to medical complications (Comment) Patient left: in bed;with call bell/phone within reach;with bed alarm set;with family/visitor present Nurse Communication: Mobility status;Other (comment) (RN present for BP readings, RN to communicate with MD) PT Visit Diagnosis: History of falling (Z91.81);Other abnormalities of gait and mobility (R26.89)    Time: 5625-6389 PT Time Calculation (min) (ACUTE ONLY): 29 min   Charges:   PT  Evaluation $PT Eval High Complexity: 1 Procedure     PT G Codes:   PT G-Codes **NOT FOR INPATIENT CLASS** Functional Assessment Tool Used: AM-PAC 6 Clicks Basic Mobility Functional Limitation: Mobility: Walking and moving around Mobility: Walking and Moving Around Current Status (H7342): At least 40 percent but less than 60 percent impaired, limited or restricted Mobility: Walking and Moving Around Goal Status [phone]): At least 40 percent but less than 60 percent impaired, limited or restricted    Phillips Grout PT, DPT    Vonzell Lindblad 01/26/2017, 11:41 AM

## 2017-01-26 NOTE — Progress Notes (Signed)
Patient ID: Brandon Manning, male   DOB: 03-28-1946, 71 y.o.   MRN: [id number]  [facility] PROGRESS NOTE  Brandon Manning QIH:[id number] DOB: Jun 13, 1946 DOA: 01/25/2017 PCP: [name], MD  HPI/Subjective: Patient feels like is going to pass out when he stands up. He feels dizzy and lightheaded. He feels weak.  Objective: Vitals:   01/26/17 1032 01/26/17 1334  BP: (!) 63/33 (!) 99/54  Pulse: 97 78  Resp:  18  Temp:  98.6 F (37 C)    Filed Weights   01/25/17 1546 01/25/17 1831 01/26/17 0506  Weight: 82.6 kg (182 lb) 82.7 kg (182 lb 4.8 oz) 81.6 kg (179 lb 14.4 oz)    ROS: Review of Systems  Constitutional: Negative for chills and fever.  Eyes: Negative for blurred vision.  Respiratory: Negative for cough and shortness of breath.   Cardiovascular: Negative for chest pain.  Gastrointestinal: Negative for abdominal pain, constipation, diarrhea, nausea and vomiting.  Genitourinary: Negative for dysuria.  Musculoskeletal: Negative for joint pain.  Neurological: Positive for dizziness and weakness. Negative for headaches.   Exam: Physical Exam  Constitutional: He is oriented to person, place, and time.  HENT:  Nose: No mucosal edema.  Mouth/Throat: No oropharyngeal exudate or posterior oropharyngeal edema.  Eyes: Conjunctivae, EOM and lids are normal. Pupils are equal, round, and reactive to light.  Neck: No JVD present. Carotid bruit is not present. No edema present. No thyroid mass and no thyromegaly present.  Cardiovascular: S1 normal and S2 normal.  Exam reveals no gallop.   No murmur heard. Pulses:      Dorsalis pedis pulses are 2+ on the right side, and 2+ on the left side.  Respiratory: No respiratory distress. He has no wheezes. He has no rhonchi. He has no rales.  GI: Soft. Bowel sounds are normal. There is no tenderness.  Musculoskeletal:       Right ankle: He exhibits no swelling.       Left ankle: He exhibits no swelling.  Lymphadenopathy:    He  has no cervical adenopathy.  Neurological: He is alert and oriented to person, place, and time. No cranial nerve deficit.  Skin: Skin is warm. No rash noted. Nails show no clubbing.  Psychiatric: He has a normal mood and affect.      Data Reviewed: Basic Metabolic Panel:  Recent Labs Lab 01/25/17 1550 01/26/17 0513  NA 140 143  K 3.2* 3.7  CL 101 109  CO2 30 28  GLUCOSE 104* 107*  BUN 20 17  CREATININE 1.34* 1.19  CALCIUM 9.5 8.9   Liver Function Tests:  Recent Labs Lab 01/25/17 1550  AST 19  ALT <5*  ALKPHOS 49  BILITOT 1.2  PROT 7.3  ALBUMIN 3.9   CBC:  Recent Labs Lab 01/25/17 1550  WBC 9.7  NEUTROABS 5.1  HGB 13.1  HCT 39.2*  MCV 84.9  PLT 266   Cardiac Enzymes:  Recent Labs Lab 01/25/17 1550  TROPONINI <0.03    Studies: Dg Chest 2 View  Result Date: 01/25/2017 CLINICAL DATA:  Syncopal episode. EXAM: CHEST  2 VIEW COMPARISON:  08/05/2009 FINDINGS: Heart size is normal. Chronic aortic atherosclerosis. The pulmonary vascularity is normal. The lungs are clear. No effusions. Ordinary chronic degenerative changes affect the spine. IMPRESSION: No active disease. Electronically Signed   By: Nelson Chimes M.D.   On: 01/25/2017 16:33   Ct Head Wo Contrast  Result Date: 01/25/2017 CLINICAL DATA:  Syncope, dizziness. EXAM: CT HEAD WITHOUT CONTRAST  TECHNIQUE: Contiguous axial images were obtained from the base of the skull through the vertex without intravenous contrast. COMPARISON:  CT scan of August 05, 2009. FINDINGS: Brain: Mild chronic ischemic white matter disease is noted. No evidence of acute infarction, hemorrhage, hydrocephalus, extra-axial collection or mass lesion/mass effect. Vascular: No hyperdense vessel or unexpected calcification. Skull: Normal. Negative for fracture or focal lesion. Sinuses/Orbits: No acute finding. Other: None. IMPRESSION: Mild chronic ischemic white matter disease. No acute intracranial abnormality seen. Electronically Signed    By: Marijo Conception, M.D.   On: 01/25/2017 16:48   Ct Renal Stone Study  Result Date: 01/25/2017 CLINICAL DATA:  Pt was going into [place] [facility] and passed out - lowered to the ground. Lightheaded x 2 days. EXAM: CT ABDOMEN AND PELVIS WITHOUT CONTRAST TECHNIQUE: Multidetector CT imaging of the abdomen and pelvis was performed following the standard protocol without IV contrast. COMPARISON:  None. FINDINGS: Lower chest: No acute abnormality. Hepatobiliary: Multiple stones within the nondistended gallbladder. No evidence of acute cholecystitis. No focal abnormality within the liver. No bile duct dilatation. Pancreas: Partially infiltrated with fat but otherwise unremarkable. Spleen: Normal in size without focal abnormality. Adrenals/Urinary Tract: Adrenal glands are unremarkable. 5 mm nonobstructing left renal stone. Right renal cyst. No hydronephrosis or perinephric edema bilaterally. No ureteral or bladder calculi identified. Bladder walls appear thickened, with inflammation, although difficult to evaluate due to the bladder decompression. Stomach/Bowel: Bowel is normal in caliber. No bowel wall thickening or evidence of bowel wall inflammation. Scattered mild diverticulosis without evidence of acute diverticulitis. Appendix is normal. Stomach is unremarkable, decompressed. Vascular/Lymphatic: No significant vascular findings are present. No enlarged abdominal or pelvic lymph nodes. Reproductive: Prostate gland is enlarged. Other: No free fluid or abscess collection. No free intraperitoneal air. Musculoskeletal: Scattered degenerative changes within the thoracic and lumbar spine, mild to moderate in degree. No acute or suspicious osseous finding. Bilateral inguinal hernias without bowel involvement. IMPRESSION: 1. Bladder walls appear somewhat thickened/edematous, but difficult to characterize due to the bladder decompression. Consider correlation with urinalysis to exclude associated cystitis.  Alternatively, findings could be related to neurogenic bladder given the underlying prostate gland enlargement. 2. Prostate gland is prominently enlarged, measuring 5.8 x 5.6 cm, causing mass effect on the bladder base. Consider correlation with physical exam findings and/or PSA lab values. 3. Cholelithiasis without evidence of acute cholecystitis. 4. Left nephrolithiasis without hydronephrosis. 5. Mild colonic diverticulosis without evidence of acute diverticulitis. 6. No acute findings. Electronically Signed   By: Franki Cabot M.D.   On: 01/25/2017 16:51    Scheduled Meds: . [START ON 01/27/2017] aspirin EC  81 mg Oral QODAY  . carbidopa-levodopa  1 tablet Oral BID  . carbidopa-levodopa  2 tablet Oral Daily  . enoxaparin (LOVENOX) injection  40 mg Subcutaneous Q24H  . finasteride  5 mg Oral Daily  . midodrine  5 mg Oral TID WC   Continuous Infusions: . sodium chloride 60 mL/hr at 01/26/17 1514    Assessment/Plan:  1. Severe orthostatic hypotension. Syncope. Hold antihypertensive medications and Flomax at this time. Continue IV fluids. Start midodrine. Start TED hose. Physical therapy for deconditioning. Sometimes Parkinson's patients can get this severe orthostatic hypotension. Blood pressure will need to be better prior to disposition. 2. Parkinson's disease continue Sinemet. Physical therapy evaluation. 3. BPH. Start finasteride. Stop Flomax with orthostatic hypotension 4. History of hypertension. I will have to hold all antihypertensives at this time  Code Status:     Code Status Orders  Start     Ordered   01/25/17 1755  Full code  Continuous     01/25/17 1755    Code Status History    Date Active Date Inactive Code Status Order ID Comments User Context   This patient has a current code status but no historical code status.    Advance Directive Documentation     Most Recent Value  Type of Advance Directive  Healthcare Power of Attorney  Pre-existing out of facility  DNR order (yellow form or pink MOST form)  -  "MOST" Form in Place?  -     Family Communication: Case discussed with wife at the bedside Disposition Plan: Blood pressure will need to be improved prior to disposition  Time spent: 28 minutes  Leslye Peer, [employer]

## 2017-01-26 NOTE — Care Management Obs Status (Signed)
[facility] NOTIFICATION   Patient Details  Name: ANTHONYMICHAEL MUNDAY MRN: [id number] Date of Birth: [birth year]   Medicare Observation Status Notification Given:  Yes    Shelbie Ammons, RN 01/26/2017, 10:17 AM

## 2017-01-26 NOTE — Plan of Care (Signed)
Problem: Education: Goal: Knowledge of [facility] Education information/materials will improve Outcome: Progressing VS WDL, free of falls during shift.  Denies pain.  No needs overnight.  Wife at bedside, call bell within reach.  WCTM.

## 2017-01-26 NOTE — Care Management (Signed)
Admitted under observation status with the diagnosis of syncope. Lives with wife, [name] [phone]). Last seen Dr. Rosezella [place] 01/07/17. Prescriptions are filled at [facility]. Occupational and physical therapy ordered per Dr. Theda Sers. They  were in the home a couple of months, unsure as to what the name of the agency was. No skilled facility. No home oxygen. Shower chair, cane, and electric wheelchair in the home. Self feed, self dress, and self baths. Fell yesterday prior to this admission. Good appeitite. Shelbie Ammons RN MSN CCM Care Management [phone]

## 2017-01-26 NOTE — Plan of Care (Signed)
Problem: Physical Regulation: Goal: Ability to maintain clinical measurements within normal limits will improve Outcome: Progressing Positive orthostatic BP. Pt symptomatic when standing, reported dizziness, lightheadedness. Dr Leslye Peer is aware. Midodrine initiated. IVF infusing.

## 2017-01-27 LAB — URINE CULTURE: CULTURE: NO GROWTH

## 2017-01-27 LAB — BASIC METABOLIC PANEL
Anion gap: 6 (ref 5–15)
BUN: 16 mg/dL (ref 6–20)
CO2: 27 mmol/L (ref 22–32)
Calcium: 8.8 mg/dL — ABNORMAL LOW (ref 8.9–10.3)
Chloride: 109 mmol/L (ref 101–111)
Creatinine, Ser: 1.11 mg/dL (ref 0.61–1.24)
Glucose, Bld: 125 mg/dL — ABNORMAL HIGH (ref 65–99)
POTASSIUM: 3.4 mmol/L — AB (ref 3.5–5.1)
SODIUM: 142 mmol/L (ref 135–145)

## 2017-01-27 MED ORDER — MIDODRINE HCL 2.5 MG PO TABS
2.5000 mg | ORAL_TABLET | Freq: Three times a day (TID) | ORAL | Status: DC
  Administered 2017-01-27 – 2017-01-28 (×2): 2.5 mg via ORAL
  Filled 2017-01-27 (×4): qty 1

## 2017-01-27 MED ORDER — MIDODRINE HCL 5 MG PO TABS
10.0000 mg | ORAL_TABLET | Freq: Three times a day (TID) | ORAL | Status: DC
  Administered 2017-01-27: 10 mg via ORAL
  Filled 2017-01-27 (×2): qty 2

## 2017-01-27 MED ORDER — MIDODRINE HCL 2.5 MG PO TABS
5.0000 mg | ORAL_TABLET | Freq: Three times a day (TID) | ORAL | Status: DC
  Filled 2017-01-27: qty 1

## 2017-01-27 MED ORDER — POTASSIUM CHLORIDE CRYS ER 20 MEQ PO TBCR
40.0000 meq | EXTENDED_RELEASE_TABLET | Freq: Once | ORAL | Status: AC
  Administered 2017-01-27: 40 meq via ORAL
  Filled 2017-01-27: qty 2

## 2017-01-27 NOTE — Progress Notes (Signed)
Physical Therapy Treatment Patient Details Name: Brandon Manning MRN: [id number] DOB: [birth year] Today's Date: 01/27/2017    History of Present Illness Brandon Manning is a 71 y.o. male with a known history of hypertension, Parkinson's disease presented to the emergency room from [facility] urgent care clinic where he passed out. Patient has been feeling lightheaded and dizzy for one day now. He was taken to the urgent care after counseling with the primary care physician. Prior to being seen patient passed out and was lowered to the ground. In the ED patient had been found to be hypotensive with systolic of 27/78 initially presently but pressure is elevated at 191/90. He has been drinking plenty of fluids and has no burning with urination. No recent change in medications. Wife reports that they were advised by his neurologist that his PD medications may cause orthostatic hypotension. He reports that he had some lightheadedness earlier this AM when moving from laying to sitting. Orthostatic vitals were obtained this morning and were positive.     PT Comments    Pt demonstrates considerable improvement with therapist on this date. Orthostatic vitals obtained with patient again: supine: BP:132/70 HR:74, sitting: BP:111/59 HR:89, and standing:107/58 HR:94. Pt remains asymptomatic throughout position changes. He is able to ambualte a full lap around RN station with short, shuffling steps. Started with rolling walker but able to progress to no assistive device. Pt reports mild increase in instability without rolling walker. Pt denies DOE or feelings of lightheadedness/presyncope with ambulation. Increased time required to complete lap and CNA follows with recliner for added safety. Per pt and wife pt is close to baseline but possible slightly more unsteady. Recommend he discharge with a rolling walker for added safety. Pt has had extensive PT/OT at home and has a good home exercise program. If he needs further PT  services wife is confident communicating with patient's neurologist. Pt will benefit from PT services to address deficits in strength, balance, and mobility in order to return to full function at home.      Follow Up Recommendations  No PT follow up;Supervision for mobility/OOB;Other (comment) (Pt at baseline, neurologist can order further PT if needed)     Equipment Recommendations  Rolling walker with 5" wheels    Recommendations for Other Services       Precautions / Restrictions Precautions Precautions: Fall Precaution Comments: Orthostatic hypotension Restrictions Weight Bearing Restrictions: No    Mobility  Bed Mobility Overal bed mobility: Needs Assistance Bed Mobility: Sit to Supine;Supine to Sit     Supine to sit: Min assist Sit to supine: Min assist   General bed mobility comments: Pt requires minA+1 for LE management. Use of bed rails, HOB elevated, and increased time required  Transfers Overall transfer level: Needs assistance Equipment used: Rolling walker (2 wheeled) Transfers: Sit to/from Stand Sit to Stand: Min guard         General transfer comment: Pt continues to move slowly and require cues for safe hand placement during transfers. Performed static standing balance to use urinal during one transfer. Orthostatic vitals obtained again today. Pt remains asymptomatic today during positional changes.   Ambulation/Gait Ambulation/Gait assistance: Min guard Ambulation Distance (Feet): 200 Feet Assistive device: Rolling walker (2 wheeled) Gait Pattern/deviations: Decreased step length - right;Decreased step length - left;Decreased dorsiflexion - right;Decreased dorsiflexion - left;Shuffle Gait velocity: Decreased Gait velocity interpretation: <1.8 ft/sec, indicative of risk for recurrent falls General Gait Details: Pt ambualtes a full lap around RN station with short, shuffling steps. Started  with rolling walker but able to progress to no assistive device.  Pt reports mild increase in instability without rolling walker. Pt denies DOE or feeling lightheaded/presyncopal with ambulation. Increased time required to complete lap and CNA follows with recliner for added safety   Stairs            Wheelchair Mobility    Modified Rankin (Stroke Patients Only)       Balance Overall balance assessment: Needs assistance Sitting-balance support: No upper extremity supported Sitting balance-Leahy Scale: Good     Standing balance support: No upper extremity supported Standing balance-Leahy Scale: Fair Standing balance comment: Able to maintain balance without UE support in standing                            Cognition Arousal/Alertness: Awake/alert Behavior During Therapy: WFL for tasks assessed/performed Overall Cognitive Status: History of cognitive impairments - at baseline                                        Exercises      General Comments        Pertinent Vitals/Pain Pain Assessment: No/denies pain    Home Living                      Prior Function            PT Goals (current goals can now be found in the care plan section) Acute Rehab PT Goals Patient Stated Goal: "I want to go home." PT Goal Formulation: With patient/family Time For Goal Achievement: 02/09/17 Potential to Achieve Goals: Good Progress towards PT goals: Progressing toward goals    Frequency    Min 2X/week      PT Plan Current plan remains appropriate    Co-evaluation              AM-PAC PT "6 Clicks" Daily Activity  Outcome Measure  Difficulty turning over in bed (including adjusting bedclothes, sheets and blankets)?: A Little Difficulty moving from lying on back to sitting on the side of the bed? : Total Difficulty sitting down on and standing up from a chair with arms (e.g., wheelchair, bedside commode, etc,.)?: A Little Help needed moving to and from a bed to chair (including a  wheelchair)?: A Little Help needed walking in hospital room?: A Little Help needed climbing 3-5 steps with a railing? : A Lot 6 Click Score: 15    End of Session Equipment Utilized During Treatment: Gait belt Activity Tolerance: Patient tolerated treatment well Patient left: in bed;with call bell/phone within reach;with bed alarm set;with family/visitor present Nurse Communication: Mobility status;Other (comment) (MD notified of performance with PT) PT Visit Diagnosis: History of falling (Z91.81);Other abnormalities of gait and mobility (R26.89)     Time: 1062-6948 PT Time Calculation (min) (ACUTE ONLY): 26 min  Charges:  $Gait Training: 8-22 mins $Therapeutic Activity: 8-22 mins                    G Codes:       Lyndel Safe Kourosh Jablonsky PT, DPT     Simren Popson 01/27/2017, 9:57 AM

## 2017-01-27 NOTE — Progress Notes (Signed)
Patient ID: Brandon Manning, male   DOB: 1946-03-19, 71 y.o.   MRN: [id number]  [facility] PROGRESS NOTE  Brandon Manning JME:[id number] DOB: 1946-06-02 DOA: 01/25/2017 PCP: [name], MD  HPI/Subjective: Patient feels a little unsteady when he was walking. Feels a little bit better than yesterday. Blood pressure still dropped down this morning and he was given the higher dose of midodrine  Objective: Vitals:   01/27/17 1136 01/27/17 1218  BP: (!) 189/86 (!) 203/94  Pulse: 64 64  Resp: 14 16  Temp:  98 F (36.7 C)    Filed Weights   01/25/17 1831 01/26/17 0506 01/27/17 0500  Weight: 82.7 kg (182 lb 4.8 oz) 81.6 kg (179 lb 14.4 oz) 82.9 kg (182 lb 11.2 oz)    ROS: Review of Systems  Constitutional: Negative for chills and fever.  Eyes: Negative for blurred vision.  Respiratory: Negative for cough and shortness of breath.   Cardiovascular: Negative for chest pain.  Gastrointestinal: Negative for abdominal pain, constipation, diarrhea, nausea and vomiting.  Genitourinary: Negative for dysuria.  Musculoskeletal: Negative for joint pain.  Neurological: Positive for dizziness and weakness. Negative for headaches.   Exam: Physical Exam  Constitutional: He is oriented to person, place, and time.  HENT:  Nose: No mucosal edema.  Mouth/Throat: No oropharyngeal exudate or posterior oropharyngeal edema.  Eyes: Pupils are equal, round, and reactive to light. Conjunctivae, EOM and lids are normal.  Neck: No JVD present. Carotid bruit is not present. No edema present. No thyroid mass and no thyromegaly present.  Cardiovascular: S1 normal and S2 normal.  Exam reveals no gallop.   No murmur heard. Pulses:      Dorsalis pedis pulses are 2+ on the right side, and 2+ on the left side.  Respiratory: No respiratory distress. He has no wheezes. He has no rhonchi. He has no rales.  GI: Soft. Bowel sounds are normal. There is no tenderness.  Musculoskeletal:       Right ankle: He  exhibits no swelling.       Left ankle: He exhibits no swelling.  Lymphadenopathy:    He has no cervical adenopathy.  Neurological: He is alert and oriented to person, place, and time. No cranial nerve deficit.  Skin: Skin is warm. No rash noted. Nails show no clubbing.  Psychiatric: He has a normal mood and affect.      Data Reviewed: Basic Metabolic Panel:  Recent Labs Lab 01/25/17 1550 01/26/17 0513 01/27/17 0613  NA 140 143 142  K 3.2* 3.7 3.4*  CL 101 109 109  CO2 30 28 27   GLUCOSE 104* 107* 125*  BUN 20 17 16   CREATININE 1.34* 1.19 1.11  CALCIUM 9.5 8.9 8.8*   Liver Function Tests:  Recent Labs Lab 01/25/17 1550  AST 19  ALT <5*  ALKPHOS 49  BILITOT 1.2  PROT 7.3  ALBUMIN 3.9   CBC:  Recent Labs Lab 01/25/17 1550  WBC 9.7  NEUTROABS 5.1  HGB 13.1  HCT 39.2*  MCV 84.9  PLT 266   Cardiac Enzymes:  Recent Labs Lab 01/25/17 1550  TROPONINI <0.03    Studies: Dg Chest 2 View  Result Date: 01/25/2017 CLINICAL DATA:  Syncopal episode. EXAM: CHEST  2 VIEW COMPARISON:  08/05/2009 FINDINGS: Heart size is normal. Chronic aortic atherosclerosis. The pulmonary vascularity is normal. The lungs are clear. No effusions. Ordinary chronic degenerative changes affect the spine. IMPRESSION: No active disease. Electronically Signed   By: Jan Fireman.D.  On: 01/25/2017 16:33   Ct Head Wo Contrast  Result Date: 01/25/2017 CLINICAL DATA:  Syncope, dizziness. EXAM: CT HEAD WITHOUT CONTRAST TECHNIQUE: Contiguous axial images were obtained from the base of the skull through the vertex without intravenous contrast. COMPARISON:  CT scan of August 05, 2009. FINDINGS: Brain: Mild chronic ischemic white matter disease is noted. No evidence of acute infarction, hemorrhage, hydrocephalus, extra-axial collection or mass lesion/mass effect. Vascular: No hyperdense vessel or unexpected calcification. Skull: Normal. Negative for fracture or focal lesion. Sinuses/Orbits: No  acute finding. Other: None. IMPRESSION: Mild chronic ischemic white matter disease. No acute intracranial abnormality seen. Electronically Signed   By: Marijo Conception, M.D.   On: 01/25/2017 16:48   Ct Renal Stone Study  Result Date: 01/25/2017 CLINICAL DATA:  Pt was going into [place] clinic and passed out - lowered to the ground. Lightheaded x 2 days. EXAM: CT ABDOMEN AND PELVIS WITHOUT CONTRAST TECHNIQUE: Multidetector CT imaging of the abdomen and pelvis was performed following the standard protocol without IV contrast. COMPARISON:  None. FINDINGS: Lower chest: No acute abnormality. Hepatobiliary: Multiple stones within the nondistended gallbladder. No evidence of acute cholecystitis. No focal abnormality within the liver. No bile duct dilatation. Pancreas: Partially infiltrated with fat but otherwise unremarkable. Spleen: Normal in size without focal abnormality. Adrenals/Urinary Tract: Adrenal glands are unremarkable. 5 mm nonobstructing left renal stone. Right renal cyst. No hydronephrosis or perinephric edema bilaterally. No ureteral or bladder calculi identified. Bladder walls appear thickened, with inflammation, although difficult to evaluate due to the bladder decompression. Stomach/Bowel: Bowel is normal in caliber. No bowel wall thickening or evidence of bowel wall inflammation. Scattered mild diverticulosis without evidence of acute diverticulitis. Appendix is normal. Stomach is unremarkable, decompressed. Vascular/Lymphatic: No significant vascular findings are present. No enlarged abdominal or pelvic lymph nodes. Reproductive: Prostate gland is enlarged. Other: No free fluid or abscess collection. No free intraperitoneal air. Musculoskeletal: Scattered degenerative changes within the thoracic and lumbar spine, mild to moderate in degree. No acute or suspicious osseous finding. Bilateral inguinal hernias without bowel involvement. IMPRESSION: 1. Bladder walls appear somewhat thickened/edematous,  but difficult to characterize due to the bladder decompression. Consider correlation with urinalysis to exclude associated cystitis. Alternatively, findings could be related to neurogenic bladder given the underlying prostate gland enlargement. 2. Prostate gland is prominently enlarged, measuring 5.8 x 5.6 cm, causing mass effect on the bladder base. Consider correlation with physical exam findings and/or PSA lab values. 3. Cholelithiasis without evidence of acute cholecystitis. 4. Left nephrolithiasis without hydronephrosis. 5. Mild colonic diverticulosis without evidence of acute diverticulitis. 6. No acute findings. Electronically Signed   By: Franki Cabot M.D.   On: 01/25/2017 16:51    Scheduled Meds: . aspirin EC  81 mg Oral QODAY  . carbidopa-levodopa  1 tablet Oral BID  . carbidopa-levodopa  2 tablet Oral Daily  . enoxaparin (LOVENOX) injection  40 mg Subcutaneous Q24H  . finasteride  5 mg Oral Daily  . midodrine  2.5 mg Oral TID WC  . potassium chloride  40 mEq Oral Once    Assessment/Plan:  1. Severe orthostatic hypotension. Syncope. Hold antihypertensive medications and Flomax at this time. This morning I increased his midodrine up to 10 mg 3 times a day. This afternoon his blood pressure was very high side decrease his midodrine down to 2.5 mg 3 times a day. Continue TED hose. Appreciate physical therapy consultation. 2. Parkinson's disease continue Sinemet. 3. BPH. Start finasteride. Stop Flomax with orthostatic hypotension 4. History of  hypertension. Blood pressure high with the high-dose midodrine. Decrease the midodrine dose. Continue to hold antihypertensives  Code Status:     Code Status Orders        Start     Ordered   01/25/17 1755  Full code  Continuous     01/25/17 1755    Code Status History    Date Active Date Inactive Code Status Order ID Comments User Context   This patient has a current code status but no historical code status.    Advance Directive  Documentation     Most Recent Value  Type of Advance Directive  Healthcare Power of Attorney  Pre-existing out of facility DNR order (yellow form or pink MOST form)  -  "MOST" Form in Place?  -     Family Communication: Case discussed with wife at the bedside Disposition Plan: Potentially home tomorrow  Time spent: 25 minutes  [place], [place]

## 2017-01-28 MED ORDER — FINASTERIDE 5 MG PO TABS: 5.0000 mg | ORAL_TABLET | Freq: Every day | ORAL | 0 refills | Status: DC

## 2017-01-28 MED ORDER — MIDODRINE HCL 2.5 MG PO TABS: 2.5000 mg | ORAL_TABLET | Freq: Three times a day (TID) | ORAL | 0 refills | Status: DC

## 2017-01-28 NOTE — Discharge Summary (Signed)
[facility] NAME: Brandon Manning    MR#:  [id number]  DATE OF BIRTH:  1945-12-28  DATE OF ADMISSION:  01/25/2017 ADMITTING PHYSICIAN: Hillary Bow, MD  DATE OF DISCHARGE: 01/28/2017 11:43 AM  PRIMARY CARE PHYSICIAN: [name], MD    ADMISSION DIAGNOSIS:  Syncope and collapse [R55]  DISCHARGE DIAGNOSIS:  Active Problems:   Orthostatic syncope   Orthostatic hypotension   SECONDARY DIAGNOSIS:   Past Medical History:  Diagnosis Date  . Constipation   . ED (erectile dysfunction)   . History of chicken pox   . HTN (hypertension) 09/09/2011  . Parkinson disease ([place]) 02/01/2014   Has been prescribed rollator walker Established with Dr Tat (01/2016)     HOSPITAL COURSE:   1.  Severe orthostatic hypotension and syncope. I saw the patient on 01/26/2017 for the first time and I held his antihypertensive medications and Flomax. I gave him on IV fluid challenge and I needed to start midodrine. I initially started midodrine at 5 mg 3 times a day. Since he was still orthostatic on the 12th, I increase his midodrine to 10 mg 3 times a day but then his systolic blood pressure went up to 200. I held one dose of midodrine and then cut him down to 2.5 mg 3 times a day. Upon discharge home he still is orthostatic but his blood pressure upon standing stated above 110. Continue midodrine at current dose. Orthostatics should be checked on every follow-up appointment. His wife will get a new blood pressure cuff and check these also at home. Recommended staying hydrated and TED hose and exercising. 2. Parkinson's disease. Continue Sinemet 3. BPH. I stop Flomax with orthostatic hypotension and started finasteride. Prescription for neck finasteride also given. Patient found to have a large prostate of 5.8 x 5.6 cm. I will leave up to his primary care physician to correlate with physical exam findings and/or PSA lab values. 4. History of hypertension.  His blood pressure needs to be on the higher side in order to compensate for when he drops down. 5. Hypokalemia replaced during the hospital course.  DISCHARGE CONDITIONS:   Satisfactory  CONSULTS OBTAINED:   none  DRUG ALLERGIES:   Allergies  Allergen Reactions  . Penicillins Rash    Has patient had a PCN reaction causing immediate rash, facial/tongue/throat swelling, SOB or lightheadedness with hypotension: No Has patient had a PCN reaction causing severe rash involving mucus membranes or skin necrosis: No Has patient had a PCN reaction that required hospitalization No Has patient had a PCN reaction occurring within the last 10 years: No If all of the above answers are "NO", then may proceed with Cephalosporin use.    DISCHARGE MEDICATIONS:   Discharge Medication List as of 01/28/2017 11:17 AM    START taking these medications   Details  finasteride (PROSCAR) 5 MG tablet Take 1 tablet (5 mg total) by mouth daily., Starting Fri 01/28/2017, Print    midodrine (PROAMATINE) 2.5 MG tablet Take 1 tablet (2.5 mg total) by mouth 3 (three) times daily with meals., Starting Fri 01/28/2017, Print      CONTINUE these medications which have NOT CHANGED   Details  aspirin EC 81 MG tablet Take 81 mg by mouth every other day., Until Discontinued, Historical Med    carbidopa-levodopa (SINEMET IR) 25-100 MG tablet 2 tablet at 7 am, 1 at 12 pm, 1 at 5 pm, Normal    HYDROcodone-acetaminophen (NORCO/VICODIN) 5-325 MG tablet Take  0.5 tablets by mouth 2 (two) times daily as needed for moderate pain., Starting Fri 01/07/2017, Print    Multiple Vitamins-Minerals (MULTIVITAMIN PO) Take 1 tablet by mouth daily., Until Discontinued, Historical Med    polyethylene glycol powder (MIRALAX) powder Take 255 g by mouth once., Starting Mon 09/15/2015, Print      STOP taking these medications     hydrochlorothiazide (MICROZIDE) 12.5 MG capsule      tamsulosin (FLOMAX) 0.4 MG CAPS capsule           DISCHARGE INSTRUCTIONS:   Follow-up PMD one week  If you experience worsening of your admission symptoms, develop shortness of breath, life threatening emergency, suicidal or homicidal thoughts you must seek medical attention immediately by calling 911 or calling your MD immediately  if symptoms less severe.  You Must read complete instructions/literature along with all the possible adverse reactions/side effects for all the Medicines you take and that have been prescribed to you. Take any new Medicines after you have completely understood and accept all the possible adverse reactions/side effects.   Please note  You were cared for by a hospitalist during your hospital stay. If you have any questions about your discharge medications or the care you received while you were in the hospital after you are discharged, you can call the unit and asked to speak with the hospitalist on call if the hospitalist that took care of you is not available. Once you are discharged, your primary care physician will handle any further medical issues. Please note that NO REFILLS for any discharge medications will be authorized once you are discharged, as it is imperative that you return to your primary care physician (or establish a relationship with a primary care physician if you do not have one) for your aftercare needs so that they can reassess your need for medications and monitor your lab values.    Today   CHIEF COMPLAINT:   Chief Complaint  Patient presents with  . Loss of Consciousness  . Dizziness    HISTORY OF PRESENT ILLNESS:  Brandon Manning  is a 71 y.o. male brought in with dizziness and syncope and found to have severe orthostatic hypotension   VITAL SIGNS:  Blood pressure 124/81, pulse 76, temperature 97.6 F (36.4 C), temperature source Oral, resp. rate 16, height 5\' 10"  (1.778 m), weight 82.1 kg (180 lb 14.4 oz), SpO2 98 %.    PHYSICAL EXAMINATION:  GENERAL:  70 y.o.-year-old  patient lying in the bed with no acute distress.  EYES: Pupils equal, round, reactive to light and accommodation. No scleral icterus. Extraocular muscles intact.  HEENT: Head atraumatic, normocephalic. Oropharynx and nasopharynx clear.  NECK:  Supple, no jugular venous distention. No thyroid enlargement, no tenderness.  LUNGS: Normal breath sounds bilaterally, no wheezing, rales,rhonchi or crepitation. No use of accessory muscles of respiration.  CARDIOVASCULAR: S1, S2 normal. No murmurs, rubs, or gallops.  ABDOMEN: Soft, non-tender, non-distended. Bowel sounds present. No organomegaly or mass.  EXTREMITIES: No pedal edema, cyanosis, or clubbing.  NEUROLOGIC: Cranial nerves II through XII are intact. Muscle strength 5/5 in all extremities. Sensation intact. Gait not checked.  PSYCHIATRIC: The patient is alert and oriented x 3.  SKIN: No obvious rash, lesion, or ulcer.   DATA REVIEW:   CBC  Recent Labs Lab 01/25/17 1550  WBC 9.7  HGB 13.1  HCT 39.2*  PLT 266    Chemistries   Recent Labs Lab 01/25/17 1550  01/27/17 0613  NA 140  < >  142  K 3.2*  < > 3.4*  CL 101  < > 109  CO2 30  < > 27  GLUCOSE 104*  < > 125*  BUN 20  < > 16  CREATININE 1.34*  < > 1.11  CALCIUM 9.5  < > 8.8*  AST 19  --   --   ALT <5*  --   --   ALKPHOS 49  --   --   BILITOT 1.2  --   --   < > = values in this interval not displayed.  Cardiac Enzymes  Recent Labs Lab 01/25/17 1550  TROPONINI <0.03    Microbiology Results  Results for orders placed or performed during the hospital encounter of 01/25/17  Urine culture     Status: None   Collection Time: 01/25/17  5:08 PM  Result Value Ref Range Status   Specimen Description URINE, RANDOM  Final   Special Requests NONE  Final   Culture   Final    NO GROWTH Performed at [facility], [facility] [address], [place], [place] [address]    Report Status 01/27/2017 FINAL  Final      Management plans discussed with the patient, family and they  are in agreement.  CODE STATUS:  Code Status History    Date Active Date Inactive Code Status Order ID Comments User Context   01/25/2017  5:55 PM 01/28/2017  2:48 PM Full Code [id number]  Hillary Bow, MD ED    Advance Directive Documentation     Most Recent Value  Type of Advance Directive  Healthcare Power of Attorney  Pre-existing out of facility DNR order (yellow form or pink MOST form)  -  "MOST" Form in Place?  -      TOTAL TIME TAKING CARE OF THIS PATIENT: 35 minutes.    Loletha Grayer M.D on 01/28/2017 at 4:20 PM  Between 7am to 6pm - Pager - [phone]  After 6pm go to [email or web address] - password [email or web address]  [phone]  CC: Primary care physician; [name], MD

## 2017-01-28 NOTE — Progress Notes (Signed)
Discharge instructions given and went over with patient and wife at bedside. Prescriptions given and reviewed. All questions answered. Patient discharged home with wife via wheelchair by nursing staff. Madlyn Frankel, RN

## 2017-01-28 NOTE — Progress Notes (Signed)
Physical Therapy Treatment Patient Details Name: Brandon Manning MRN: [id number] DOB: 1945-09-07 Today's Date: 01/28/2017    History of Present Illness Brandon Manning is a 71 y.o. male with a known history of hypertension, Parkinson's disease presented to the emergency room from [facility] urgent care clinic where he passed out. Patient has been feeling lightheaded and dizzy for one day now. He was taken to the urgent care after counseling with the primary care physician. Prior to being seen patient passed out and was lowered to the ground. In the ED patient had been found to be hypotensive with systolic of 89/21 initially presently but pressure is elevated at 191/90. He has been drinking plenty of fluids and has no burning with urination. No recent change in medications. Wife reports that they were advised by his neurologist that his PD medications may cause orthostatic hypotension. He reports that he had some lightheadedness earlier this AM when moving from laying to sitting. Orthostatic vitals were obtained this morning and were positive.     PT Comments    Pt presents with deficits in strength, transfers, mobility, gait, and balance. Orthostatic vitals obtained with patient again: supine: BP:167/79 HR:78, sitting: BP:147/89 HR:80, and standing:110/62 HR:80. Pt remains asymptomatic throughout position changes. He is able to ambualte a full lap around RN station with short, shuffling steps. Started with rolling walker but able to progress to no assistive device once out of room in the hall. Pt reports no instability without rolling walker this date. Pt denies DOE or feelings of lightheadedness or presyncope with ambulation. Increased time required to complete lap and CNA follows with recliner for added safety. Confirmed with pt and wife that pt is close to baseline and that has had extensive PT/OT at home and has a good home exercise program. Confirmed that if pt requires further PT services wife is  confident communicating with patient's neurologist. Pt will benefit from PT services to address deficits in strength, balance, and mobility in order to return to full function at home.     Follow Up Recommendations  No PT follow up;Supervision for mobility/OOB;Other (comment)     Equipment Recommendations  Rolling walker with 5" wheels    Recommendations for Other Services       Precautions / Restrictions Precautions Precautions: Fall Precaution Comments: Orthostatic hypotension Restrictions Weight Bearing Restrictions: No    Mobility  Bed Mobility Overal bed mobility: Needs Assistance Bed Mobility: Sit to Supine;Supine to Sit     Supine to sit: Min assist Sit to supine: Min assist   General bed mobility comments: Pt requires minA+1 for LE management. Use of bed rails, HOB elevated, and increased time required.  Pt/spouse educated on log roll technique for decreased caregiver assistance.   Transfers Overall transfer level: Needs assistance Equipment used: Rolling walker (2 wheeled) Transfers: Sit to/from Stand Sit to Stand: Min guard         General transfer comment: Orthostatic vitals obtained again today. Pt remains asymptomatic today during positional changes.   Ambulation/Gait Ambulation/Gait assistance: Min guard Ambulation Distance (Feet): 200 Feet Assistive device: Rolling walker (2 wheeled) (Progressed to no AD during ambulation) Gait Pattern/deviations: Decreased step length - right;Decreased step length - left;Shuffle Gait velocity: Decreased Gait velocity interpretation: <1.8 ft/sec, indicative of risk for recurrent falls General Gait Details: Pt ambualtes a full lap around RN station with short, shuffling steps. Started with rolling walker but able to progress to no assistive device. Pt reports feeling steady without rolling walker. Pt denies DOE or  feeling lightheaded/presyncopal with ambulation. Increased time required to complete lap and CNA follows with  recliner for added safety   Stairs            Wheelchair Mobility    Modified Rankin (Stroke Patients Only)       Balance Overall balance assessment: Needs assistance Sitting-balance support: No upper extremity supported Sitting balance-Leahy Scale: Good     Standing balance support: No upper extremity supported Standing balance-Leahy Scale: Fair Standing balance comment: Able to maintain balance without UE support in standing                            Cognition Arousal/Alertness: Awake/alert Behavior During Therapy: WFL for tasks assessed/performed Overall Cognitive Status: Within Functional Limits for tasks assessed                                        Exercises      General Comments        Pertinent Vitals/Pain Pain Assessment: No/denies pain    Home Living                      Prior Function            PT Goals (current goals can now be found in the care plan section) Acute Rehab PT Goals Patient Stated Goal: "I want to go home." PT Goal Formulation: With patient/family Time For Goal Achievement: 02/09/17 Potential to Achieve Goals: Good Progress towards PT goals: Progressing toward goals    Frequency    Min 2X/week      PT Plan Current plan remains appropriate    Co-evaluation              AM-PAC PT "6 Clicks" Daily Activity  Outcome Measure  Difficulty turning over in bed (including adjusting bedclothes, sheets and blankets)?: Total Difficulty moving from lying on back to sitting on the side of the bed? : Total Difficulty sitting down on and standing up from a chair with arms (e.g., wheelchair, bedside commode, etc,.)?: Total Help needed moving to and from a bed to chair (including a wheelchair)?: A Little Help needed walking in hospital room?: A Little Help needed climbing 3-5 steps with a railing? : A Lot 6 Click Score: 11    End of Session Equipment Utilized During Treatment: Gait  belt Activity Tolerance: Patient tolerated treatment well Patient left: in bed;with call bell/phone within reach;with bed alarm set;with family/visitor present Nurse Communication: Mobility status;Other (comment) (Orthostatic BP results) PT Visit Diagnosis: History of falling (Z91.81);Other abnormalities of gait and mobility (R26.89)     Time: 0165-5374 PT Time Calculation (min) (ACUTE ONLY): 26 min  Charges:  $Gait Training: 8-22 mins $Therapeutic Activity: 8-22 mins                    G Codes:       D. Royetta Asal PT, DPT 01/28/17, 12:00 PM

## 2017-01-31 ENCOUNTER — Encounter: Payer: Self-pay | Admitting: Family Medicine

## 2017-01-31 ENCOUNTER — Telehealth: Payer: Self-pay

## 2017-01-31 NOTE — Telephone Encounter (Signed)
Transition Care Management Follow-up Telephone Call   Date discharged? 01/28/2017   How have you been since you were released from the hospital? "feeling pretty good so far"   Do you understand why you were in the hospital? yes   Do you understand the discharge instructions? yes   Where were you discharged to? Home. Lives with wife.    Items Reviewed:  Medications reviewed: no, Did not have medications available at time of call. Plans to bring to f/u appt  Allergies reviewed: yes  Dietary changes reviewed: no changes  Referrals reviewed: no   Functional Questionnaire:   Activities of Daily Living (ADLs):   He states they are independent in the following: ambulation, bathing and hygiene, feeding, continence, grooming, toileting and dressing States they require assistance with the following: None.    Any transportation issues/concerns?: no   Any patient concerns? no   Confirmed importance and date/time of follow-up visits scheduled yes  Provider Appointment booked with PCP 02/07/17 @ 12pm.   Confirmed with patient if condition begins to worsen call PCP or go to the ER.  Patient was given the office number and encouraged to call back with question or concerns.  : yes

## 2017-02-07 ENCOUNTER — Ambulatory Visit: Payer: Medicare Other | Admitting: Family Medicine

## 2017-02-11 ENCOUNTER — Encounter: Payer: Self-pay | Admitting: Family Medicine

## 2017-02-11 ENCOUNTER — Ambulatory Visit (INDEPENDENT_AMBULATORY_CARE_PROVIDER_SITE_OTHER): Payer: Commercial Managed Care - PPO | Admitting: Family Medicine

## 2017-02-11 VITALS — BP 142/102 | HR 66 | Temp 98.6°F | Wt 183.0 lb

## 2017-02-11 DIAGNOSIS — I951 Orthostatic hypotension: Secondary | ICD-10-CM | POA: Diagnosis not present

## 2017-02-11 DIAGNOSIS — I10 Essential (primary) hypertension: Secondary | ICD-10-CM | POA: Diagnosis not present

## 2017-02-11 DIAGNOSIS — N138 Other obstructive and reflux uropathy: Secondary | ICD-10-CM | POA: Diagnosis not present

## 2017-02-11 DIAGNOSIS — G20 Parkinson's disease: Secondary | ICD-10-CM

## 2017-02-11 DIAGNOSIS — N401 Enlarged prostate with lower urinary tract symptoms: Secondary | ICD-10-CM

## 2017-02-11 MED ORDER — MIDODRINE HCL 2.5 MG PO TABS: 2.5000 mg | ORAL_TABLET | Freq: Three times a day (TID) | ORAL | 6 refills | Status: DC

## 2017-02-11 MED ORDER — FINASTERIDE 5 MG PO TABS: 5.0000 mg | ORAL_TABLET | Freq: Every day | ORAL | 1 refills | Status: DC

## 2017-02-11 NOTE — Assessment & Plan Note (Addendum)
PSAs have been stable. Evidence of BPH on recent CT abd/pelvis.  Finasteride recently started, however increasing nocturia and daytime frequency. I suggested flomax QO night x 2 wks then stop. I don't think this will significantly contribute to syncope.

## 2017-02-11 NOTE — Assessment & Plan Note (Addendum)
Orthostatic vital signs negative today. Continue midodrine 2.5mg  TID for now.

## 2017-02-11 NOTE — Progress Notes (Signed)
BP (!) 142/102 (BP Location: Right Arm, Patient Position: Sitting, Cuff Size: Large)   Pulse 66   Temp 98.6 F (37 C) (Oral)   Wt 183 lb (83 kg)   SpO2 100%   BMI 26.26 kg/m    CC: hosp f/u visit Subjective:    Patient ID: Brandon Manning, male    DOB: [birth year], 71 y.o.   MRN: [id number]  HPI: Brandon Manning is a 71 y.o. male presenting on 02/11/2017 for Hospitalization Follow-up (Syncope 01-25-17 due to Hypotension. They stopped his HCTZ and tamsulosin.)   Recent hospitalization for severe orthostatic hypotension and syncope - antihypertensives were held, flomax was held. He was started on midodrine 2.5mg  TID. Sent home with TED hose stockings. Finasteride was started in place of flomax. Imaging showed enlarged prostate (known). PSAs previously have been stable.   Since home, he notes increased daytime urination and nocturia x3-4 (off flomax).  He stays well hydrated with plenty of water.  Wife thinks hot weather walks contributed.  He is using stockings at home.   DATE OF ADMISSION:  01/25/2017    DATE OF DISCHARGE: 01/28/2017 11:43 AM TCM f/u phone call performed 01/31/2017  Active Problems:   Orthostatic syncope   Orthostatic hypotension   Lab Results  Component Value Date   PSA 2.81 07/13/2016   PSA 2.77 06/03/2015   PSA 3.00 11/03/2012    Relevant past medical, surgical, family and social history reviewed and updated as indicated. Interim medical history since our last visit reviewed. Allergies and medications reviewed and updated. Outpatient Medications Prior to Visit  Medication Sig Dispense Refill  . aspirin EC 81 MG tablet Take 81 mg by mouth every other day.    . carbidopa-levodopa (SINEMET IR) 25-100 MG tablet 2 tablet at 7 am, 1 at 12 pm, 1 at 5 pm 360 tablet 1  . HYDROcodone-acetaminophen (NORCO/VICODIN) 5-325 MG tablet Take 0.5 tablets by mouth 2 (two) times daily as needed for moderate pain. 30 tablet 0  . Multiple Vitamins-Minerals (MULTIVITAMIN PO)  Take 1 tablet by mouth daily.    . polyethylene glycol powder (MIRALAX) powder Take 255 g by mouth once. 500 g 0  . finasteride (PROSCAR) 5 MG tablet Take 1 tablet (5 mg total) by mouth daily. 30 tablet 0  . midodrine (PROAMATINE) 2.5 MG tablet Take 1 tablet (2.5 mg total) by mouth 3 (three) times daily with meals. 90 tablet 0   No facility-administered medications prior to visit.      Per HPI unless specifically indicated in ROS section below Review of Systems     Objective:    BP (!) 142/102 (BP Location: Right Arm, Patient Position: Sitting, Cuff Size: Large)   Pulse 66   Temp 98.6 F (37 C) (Oral)   Wt 183 lb (83 kg)   SpO2 100%   BMI 26.26 kg/m   Wt Readings from Last 3 Encounters:  02/11/17 183 lb (83 kg)  01/28/17 180 lb 14.4 oz (82.1 kg)  01/07/17 182 lb (82.6 kg)    Physical Exam  Constitutional: He appears well-developed and well-nourished. No distress.  HENT:  Mouth/Throat: Oropharynx is clear and moist. No oropharyngeal exudate.  Cardiovascular: Normal rate, regular rhythm, normal heart sounds and intact distal pulses.   No murmur heard. Pulmonary/Chest: Effort normal and breath sounds normal. No respiratory distress. He has no wheezes. He has no rales.  Abdominal: Soft. Bowel sounds are normal. He exhibits no distension and no mass. There is no tenderness. There is  no rebound and no guarding.  Musculoskeletal: He exhibits no edema.  Skin: Skin is warm and dry. No rash noted.  Psychiatric: He has a normal mood and affect.  Nursing note and vitals reviewed.  Orthostatics negative today    Assessment & Plan:   Problem List Items Addressed This Visit    BPH with obstruction/lower urinary tract symptoms    PSAs have been stable. Evidence of BPH on recent CT abd/pelvis.  Finasteride recently started, however increasing nocturia and daytime frequency. I suggested flomax QO night x 2 wks then stop. I don't think this will significantly contribute to syncope.         Relevant Medications   finasteride (PROSCAR) 5 MG tablet   HTN (hypertension)    Recently hypotensive, HCTZ stopped. I agree with staying off this medicine. Wife will monitor bp closely and let me know if consistently >150/100.       Relevant Medications   midodrine (PROAMATINE) 2.5 MG tablet   Orthostatic hypotension    Orthostatic vital signs negative today. Continue midodrine 2.5mg  TID for now.       Relevant Medications   midodrine (PROAMATINE) 2.5 MG tablet   Orthostatic syncope - Primary    Recent hospitalization for this. Anticipate multifactorial combination of autonomic instability from PD, hot weather, HTCZ and less likely flomax contribution. Discussed with patient. Will stay off HCTZ.       Relevant Medications   midodrine (PROAMATINE) 2.5 MG tablet   Parkinson disease (HCC)    Continues sinemet IR, neuro f/u scheduled next month.          Follow up plan: Return in about 5 months (around 07/14/2017) for follow up visit.  [name], MD

## 2017-02-11 NOTE — Assessment & Plan Note (Addendum)
Continues sinemet IR, neuro f/u scheduled next month.

## 2017-02-11 NOTE — Assessment & Plan Note (Signed)
Recent hospitalization for this. Anticipate multifactorial combination of autonomic instability from PD, hot weather, HTCZ and less likely flomax contribution. Discussed with patient. Will stay off HCTZ.

## 2017-02-11 NOTE — Patient Instructions (Addendum)
Continue finasteride long acting prostate medicine - we won't likely notice effect for several weeks so may try flomax 0.4mg  every other night for next 2 weeks then try off medicine. If recurrent dizziness/lightheadedness or passing out, stop flomax.  Stay off hydrochlorothiazide.  Continue midodrine 2.5mg  three times daily for now, let us know if blood pressures consistently >150/100.  I'm glad you're doing well today.  Return late December for follow up visit.

## 2017-02-11 NOTE — Assessment & Plan Note (Signed)
Recently hypotensive, HCTZ stopped. I agree with staying off this medicine. Wife will monitor bp closely and let me know if consistently >150/100.

## 2017-02-28 ENCOUNTER — Telehealth: Payer: Self-pay | Admitting: Family Medicine

## 2017-02-28 ENCOUNTER — Telehealth: Payer: Self-pay

## 2017-02-28 NOTE — Telephone Encounter (Signed)
Wife called back, she would like an explanation since husband can't understand. Please call at work [phone] and ask to speak to [employer]

## 2017-02-28 NOTE — Telephone Encounter (Signed)
Spoke to pt and advised per Dr Darnell Level. States his readings have been "high" and he will have wife contact office back to discuss further

## 2017-02-28 NOTE — Telephone Encounter (Signed)
Error

## 2017-02-28 NOTE — Telephone Encounter (Signed)
When pt was in the hospital recently, he was put on midodrine to raise his BP. He went to have his teeth extracted Friday, but was unable to do it because his BP was elevated. She is asking if he needs to be seen here or what he should do?

## 2017-02-28 NOTE — Telephone Encounter (Signed)
How are blood pressures running? Would recommend they keep log of blood pressures once daily at different times of day. If consistently >150/100, would decrease midodrine to twice daily, and hold day of dental surgery.

## 2017-03-01 NOTE — Telephone Encounter (Signed)
Attempted to contact pt's wife at home and work; unable to leave vm

## 2017-03-03 NOTE — Telephone Encounter (Signed)
Spoke to pts wife and advised per Dr Darnell Level. She will decrease meds and keep a log, and will update the office of his readings for Dr Jackelyn Hoehn advice.
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
## 2017-03-11 ENCOUNTER — Encounter: Payer: Self-pay | Admitting: Neurology

## 2017-03-11 ENCOUNTER — Ambulatory Visit (INDEPENDENT_AMBULATORY_CARE_PROVIDER_SITE_OTHER): Payer: Commercial Managed Care - PPO | Admitting: Neurology

## 2017-03-11 VITALS — Ht 69.5 in | Wt 181.0 lb

## 2017-03-11 DIAGNOSIS — G20 Parkinson's disease: Secondary | ICD-10-CM

## 2017-03-11 DIAGNOSIS — G20A1 Parkinson's disease without dyskinesia, without mention of fluctuations: Secondary | ICD-10-CM

## 2017-03-11 DIAGNOSIS — G903 Multi-system degeneration of the autonomic nervous system: Secondary | ICD-10-CM

## 2017-03-11 DIAGNOSIS — F0280 Dementia in other diseases classified elsewhere without behavioral disturbance: Secondary | ICD-10-CM

## 2017-03-11 NOTE — Patient Instructions (Signed)
It was good to see you!  Keep hydrating well.  Exercise safely.  Keep a blood pressure log.  Call us when you need refills!    I will see you in 4 months.  Call if you need me sooner.

## 2017-04-14 ENCOUNTER — Other Ambulatory Visit: Payer: Self-pay

## 2017-04-14 MED ORDER — HYDROCODONE-ACETAMINOPHEN 5-325 MG PO TABS: 0.5000 | ORAL_TABLET | Freq: Two times a day (BID) | ORAL | 0 refills | Status: DC | PRN

## 2017-04-14 NOTE — Telephone Encounter (Signed)
Pt left v/m requesting rx hydrocodone apap. Call when ready for pick up. rx last printed # 30 on 01/07/17. Last seen 02/11/17. Do not see UDS on lab list.

## 2017-04-14 NOTE — Telephone Encounter (Signed)
Printed and in CMA box 

## 2017-04-14 NOTE — Telephone Encounter (Signed)
Spoke with pt notifying him his rx is ready to pick up. Placed rx at front office.

## 2017-04-18 ENCOUNTER — Telehealth: Payer: Self-pay | Admitting: Family Medicine

## 2017-04-18 NOTE — Telephone Encounter (Signed)
Patient's wife brought in a copy of a jury summons.  She needs a letter stating that she cannot appear because of her husbands health.  She will call in the morning to see if it's ready, because she is suppose to appear in court on Thursday.  The form was placed in the Provider's prescription incoming box.

## 2017-04-19 ENCOUNTER — Inpatient Hospital Stay
Admission: EM | Admit: 2017-04-19 | Discharge: 2017-04-21 | DRG: 312 | Disposition: A | Discharge status: Home Health | Payer: Medicare Other | Attending: Internal Medicine | Admitting: Internal Medicine

## 2017-04-19 ENCOUNTER — Encounter: Payer: Self-pay | Admitting: Internal Medicine

## 2017-04-19 DIAGNOSIS — E860 Dehydration: Secondary | ICD-10-CM

## 2017-04-19 DIAGNOSIS — I951 Orthostatic hypotension: Secondary | ICD-10-CM | POA: Diagnosis not present

## 2017-04-19 DIAGNOSIS — K5909 Other constipation: Secondary | ICD-10-CM | POA: Diagnosis present

## 2017-04-19 DIAGNOSIS — H9190 Unspecified hearing loss, unspecified ear: Secondary | ICD-10-CM | POA: Diagnosis present

## 2017-04-19 DIAGNOSIS — I10 Essential (primary) hypertension: Secondary | ICD-10-CM | POA: Diagnosis present

## 2017-04-19 DIAGNOSIS — Z79891 Long term (current) use of opiate analgesic: Secondary | ICD-10-CM

## 2017-04-19 DIAGNOSIS — N400 Enlarged prostate without lower urinary tract symptoms: Secondary | ICD-10-CM | POA: Diagnosis present

## 2017-04-19 DIAGNOSIS — Z66 Do not resuscitate: Secondary | ICD-10-CM | POA: Diagnosis present

## 2017-04-19 DIAGNOSIS — G20 Parkinson's disease: Secondary | ICD-10-CM | POA: Diagnosis present

## 2017-04-19 DIAGNOSIS — R030 Elevated blood-pressure reading, without diagnosis of hypertension: Secondary | ICD-10-CM | POA: Diagnosis not present

## 2017-04-19 DIAGNOSIS — Z833 Family history of diabetes mellitus: Secondary | ICD-10-CM

## 2017-04-19 DIAGNOSIS — M6281 Muscle weakness (generalized): Secondary | ICD-10-CM | POA: Diagnosis not present

## 2017-04-19 DIAGNOSIS — Z79899 Other long term (current) drug therapy: Secondary | ICD-10-CM

## 2017-04-19 DIAGNOSIS — E876 Hypokalemia: Secondary | ICD-10-CM | POA: Diagnosis present

## 2017-04-19 DIAGNOSIS — Z841 Family history of disorders of kidney and ureter: Secondary | ICD-10-CM

## 2017-04-19 DIAGNOSIS — Z8261 Family history of arthritis: Secondary | ICD-10-CM

## 2017-04-19 DIAGNOSIS — Z7982 Long term (current) use of aspirin: Secondary | ICD-10-CM | POA: Diagnosis not present

## 2017-04-19 DIAGNOSIS — W19XXXA Unspecified fall, initial encounter: Secondary | ICD-10-CM | POA: Diagnosis present

## 2017-04-19 DIAGNOSIS — Z880 Allergy status to penicillin: Secondary | ICD-10-CM | POA: Diagnosis not present

## 2017-04-19 DIAGNOSIS — Z87891 Personal history of nicotine dependence: Secondary | ICD-10-CM

## 2017-04-19 DIAGNOSIS — Z8249 Family history of ischemic heart disease and other diseases of the circulatory system: Secondary | ICD-10-CM

## 2017-04-19 LAB — BASIC METABOLIC PANEL
ANION GAP: 9 (ref 5–15)
BUN: 23 mg/dL — ABNORMAL HIGH (ref 6–20)
CHLORIDE: 100 mmol/L — AB (ref 101–111)
CO2: 29 mmol/L (ref 22–32)
Calcium: 9.4 mg/dL (ref 8.9–10.3)
Creatinine, Ser: 1.37 mg/dL — ABNORMAL HIGH (ref 0.61–1.24)
GFR calc Af Amer: 57 mL/min — ABNORMAL LOW (ref >60)
GFR, EST NON AFRICAN AMERICAN: 50 mL/min — AB (ref >60)
GLUCOSE: 136 mg/dL — AB (ref 65–99)
POTASSIUM: 3.1 mmol/L — AB (ref 3.5–5.1)
Sodium: 138 mmol/L (ref 135–145)

## 2017-04-19 LAB — CBC
HEMATOCRIT: 39.1 % — AB (ref 40.0–52.0)
Hemoglobin: 13.1 g/dL (ref 13.0–18.0)
MCH: 28.4 pg (ref 26.0–34.0)
MCHC: 33.5 g/dL (ref 32.0–36.0)
MCV: 84.7 fL (ref 80.0–100.0)
PLATELETS: 246 10*3/uL (ref 150–440)
RBC: 4.62 MIL/uL (ref 4.40–5.90)
RDW: 13.9 % (ref 11.5–14.5)
WBC: 7.7 10*3/uL (ref 3.8–10.6)

## 2017-04-19 LAB — URINALYSIS, COMPLETE (UACMP) WITH MICROSCOPIC
BACTERIA UA: NONE SEEN
Bilirubin Urine: NEGATIVE
Glucose, UA: NEGATIVE mg/dL
KETONES UR: 5 mg/dL — AB
Leukocytes, UA: NEGATIVE
Nitrite: NEGATIVE
PH: 6 (ref 5.0–8.0)
Protein, ur: NEGATIVE mg/dL
Specific Gravity, Urine: 1.017 (ref 1.005–1.030)

## 2017-04-19 LAB — TROPONIN I

## 2017-04-19 MED ORDER — POTASSIUM CHLORIDE CRYS ER 20 MEQ PO TBCR
EXTENDED_RELEASE_TABLET | ORAL | Status: AC
  Administered 2017-04-19: 40 meq via ORAL
  Filled 2017-04-19: qty 2

## 2017-04-19 MED ORDER — ASPIRIN EC 81 MG PO TBEC
81.0000 mg | DELAYED_RELEASE_TABLET | ORAL | Status: DC
  Administered 2017-04-19 – 2017-04-21 (×2): 81 mg via ORAL
  Filled 2017-04-19: qty 1

## 2017-04-19 MED ORDER — ENOXAPARIN SODIUM 40 MG/0.4ML ~~LOC~~ SOLN
SUBCUTANEOUS | Status: AC
  Administered 2017-04-19: 40 mg via SUBCUTANEOUS
  Filled 2017-04-19: qty 0.4

## 2017-04-19 MED ORDER — POLYETHYLENE GLYCOL 3350 17 GM/SCOOP PO POWD: 1.0000 | Freq: Every day | ORAL | Status: DC | PRN

## 2017-04-19 MED ORDER — ACETAMINOPHEN 325 MG PO TABS: 650.0000 mg | ORAL_TABLET | Freq: Four times a day (QID) | ORAL | Status: DC | PRN

## 2017-04-19 MED ORDER — MIDODRINE HCL 5 MG PO TABS
2.5000 mg | ORAL_TABLET | Freq: Three times a day (TID) | ORAL | Status: DC
  Administered 2017-04-19: 2.5 mg via ORAL
  Filled 2017-04-19: qty 1
  Filled 2017-04-19 (×2): qty 0.5

## 2017-04-19 MED ORDER — POTASSIUM CHLORIDE CRYS ER 20 MEQ PO TBCR
40.0000 meq | EXTENDED_RELEASE_TABLET | Freq: Once | ORAL | Status: AC
  Administered 2017-04-19: 40 meq via ORAL

## 2017-04-19 MED ORDER — POLYETHYLENE GLYCOL 3350 17 G PO PACK
17.0000 g | PACK | Freq: Every day | ORAL | Status: DC | PRN
Start: 2017-04-19 — End: 2017-04-21

## 2017-04-19 MED ORDER — ENOXAPARIN SODIUM 40 MG/0.4ML ~~LOC~~ SOLN
40.0000 mg | SUBCUTANEOUS | Status: DC
  Administered 2017-04-19 – 2017-04-20 (×2): 40 mg via SUBCUTANEOUS
  Filled 2017-04-19 (×2): qty 0.4

## 2017-04-19 MED ORDER — CARBIDOPA-LEVODOPA 25-100 MG PO TABS
1.0000 | ORAL_TABLET | ORAL | Status: DC
  Administered 2017-04-19 – 2017-04-21 (×3): 1 via ORAL
  Filled 2017-04-19 (×4): qty 1

## 2017-04-19 MED ORDER — ASPIRIN EC 81 MG PO TBEC
DELAYED_RELEASE_TABLET | ORAL | Status: AC
  Administered 2017-04-19: 81 mg via ORAL
  Filled 2017-04-19: qty 1

## 2017-04-19 MED ORDER — ACETAMINOPHEN 650 MG RE SUPP: 650.0000 mg | Freq: Four times a day (QID) | RECTAL | Status: DC | PRN

## 2017-04-19 MED ORDER — FINASTERIDE 5 MG PO TABS
5.0000 mg | ORAL_TABLET | Freq: Every day | ORAL | Status: DC
  Administered 2017-04-20 – 2017-04-21 (×2): 5 mg via ORAL
  Filled 2017-04-19 (×2): qty 1

## 2017-04-19 MED ORDER — SODIUM CHLORIDE 0.9 % IV SOLN
1000.0000 mL | Freq: Once | INTRAVENOUS | Status: AC
  Administered 2017-04-19: 1000 mL via INTRAVENOUS

## 2017-04-19 MED ORDER — MIDODRINE HCL 5 MG PO TABS
5.0000 mg | ORAL_TABLET | Freq: Three times a day (TID) | ORAL | Status: DC
  Administered 2017-04-19: 5 mg via ORAL
  Filled 2017-04-19 (×2): qty 1

## 2017-04-19 MED ORDER — CARBIDOPA-LEVODOPA 25-100 MG PO TABS
1.0000 | ORAL_TABLET | ORAL | Status: DC
  Administered 2017-04-19 – 2017-04-20 (×2): 1 via ORAL
  Filled 2017-04-19 (×3): qty 1

## 2017-04-19 MED ORDER — CARBIDOPA-LEVODOPA 25-100 MG PO TABS
2.0000 | ORAL_TABLET | ORAL | Status: DC
  Administered 2017-04-20 – 2017-04-21 (×2): 2 via ORAL
  Filled 2017-04-19 (×2): qty 2

## 2017-04-19 MED ORDER — HYDROCODONE-ACETAMINOPHEN 5-325 MG PO TABS: 0.5000 | ORAL_TABLET | Freq: Two times a day (BID) | ORAL | Status: DC | PRN

## 2017-04-19 MED ORDER — SODIUM CHLORIDE 0.9 % IV SOLN
INTRAVENOUS | Status: DC
  Administered 2017-04-19 – 2017-04-20 (×3): via INTRAVENOUS
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
## 2017-04-19 NOTE — Telephone Encounter (Signed)
Letter written and in CMA box. If this doesn't suffice, she will need to touch base with her own PCP.

## 2017-04-19 NOTE — ED Notes (Signed)
Patient made aware of need of urine sample. States he does not need to void at this time. Urinal and call light within reach.

## 2017-04-19 NOTE — ED Triage Notes (Addendum)
Patient to [facility] via EMS from home after a fall.  Per EMS patient up to the bathroom became weak and fell.  Patient's wife assisted patient back to bed.  Patient up to bathroom again with wife assistance became weak and she lower him to the floor.  EMS reports 12-lead normal.
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
## 2017-04-19 NOTE — Progress Notes (Signed)
Per Dr. Leslye Peer give midodrine and do not administer any antihypertensives. MD wants patients BP to be closer to 180 so that when he becomes orthostatic his BP will not be as low.

## 2017-04-19 NOTE — Evaluation (Signed)
Physical Therapy Evaluation Patient Details Name: Brandon Manning MRN: [id number] DOB: November 16, 1945 Today's Date: 04/19/2017   History of Present Illness  presented to ER secondary to worsening dizziness, weakness and fall; admitted for management of symptomatic orthostastic hypotension.  PMH significant for HTN, parkinsons.  Clinical Impression  Upon evaluation, patient alert and oriented; follows all commands and demonstrates fair/good insight into deficits and overall health condition.  R hemi-body with baseline weakness, bradykinesia due to Parkinson's (minimally changed with this episode).  Currently requiring min assist for bed mobility, sit/stand, basic transfers and very short-distance gait (5') with RW, min assist.  Constant hands-on assist for anterior weight translation to prevent posterior LOB with all standing activities. High risk for falls in current state. Of note, patient continues to symptomatic orthostasis noted during transition to upright.  Does not significantly recover or accommodate to position despite prolonged positioning or introduction of activities to promote improved BP control.  Additional mobility deferred as a result; will continue to assess/progress as medically appropriate. In current state, patient unable to safely negotiate distances required to access home environment due to risk of syncope. See vitals flowsheet for full orthostatic assessment; end of session (reclined in chair) BP 103/64, HR 89.  RN informed/aware of patient status and response to activity. Would benefit from skilled PT to address above deficits and promote optimal return to PLOF; recommend transition to STR upon discharge from acute hospitalization, but will monitor functional status as BP control improves throughout hospitalization.     Follow Up Recommendations SNF    Equipment Recommendations  Rolling walker with 5" wheels    Recommendations for Other Services       Precautions /  Restrictions Precautions Precautions: Fall Restrictions Weight Bearing Restrictions: No      Mobility  Bed Mobility Overal bed mobility: Needs Assistance Bed Mobility: Supine to Sit     Supine to sit: Min assist        Transfers Overall transfer level: Needs assistance Equipment used: Rolling walker (2 wheeled) Transfers: Sit to/from Stand Sit to Stand: Min assist         General transfer comment: multiple attempts required, using posterior surface of bilat LEs against edge of bed surface to assist with lift off; assist from therapist for anterior weight translation and midline balance/orientation in A/P plane  Ambulation/Gait Ambulation/Gait assistance: Min assist Ambulation Distance (Feet): 5 Feet Assistive device: Rolling walker (2 wheeled)       General Gait Details: very short, shuffling steps with poor step height/length and overall foot clearance; constant assist from therapist to prevent posterior LOB  Stairs            Wheelchair Mobility    Modified Rankin (Stroke Patients Only)       Balance Overall balance assessment: Needs assistance Sitting-balance support: No upper extremity supported;Feet supported Sitting balance-Leahy Scale: Good     Standing balance support: Bilateral upper extremity supported Standing balance-Leahy Scale: Poor                               Pertinent Vitals/Pain Pain Assessment: No/denies pain    Home Living Family/patient expects to be discharged to:: Private residence Living Arrangements: Spouse/significant other Available Help at Discharge: Family Type of Home: House Home Access: Stairs to enter Entrance Stairs-Rails: None   Home Layout: One level Home Equipment: Clinical cytogeneticist - 2 wheels      Prior Function Level of Independence: Needs assistance  ADL's / Homemaking Assistance Needed: Mostly independent with ADLs, requires intermittent assist from wife with bathing. Requires  assist with IADLs  Comments: Reports baseline ambulatory without assist device as primary mobility     Hand Dominance        Extremity/Trunk Assessment   Upper Extremity Assessment Upper Extremity Assessment:  (R UE grossly 4-/5 throughout, L UE 4+ to 5/5 throughout)    Lower Extremity Assessment Lower Extremity Assessment:  (R LE grossly 4-/5, L LE grossly 4+ to 5/5 throughout.  R LE generally bradykinetic, decreased coordination and fine motor control)    Cervical / Trunk Assessment Cervical / Trunk Assessment: Kyphotic (forward flexed posture, forward head; limited lumbar mobility)  Communication      Cognition Arousal/Alertness: Awake/alert Behavior During Therapy: WFL for tasks assessed/performed Overall Cognitive Status: Within Functional Limits for tasks assessed                                        General Comments      Exercises Other Exercises Other Exercises: Seated UE therex, 1x10, AROM for BP control and accommodation to upright position. Maintains fairly small-scale movements and forward flexed posture throughout   Assessment/Plan    PT Assessment Patient needs continued PT services  PT Problem List Decreased strength;Decreased range of motion;Decreased activity tolerance;Decreased balance;Decreased mobility;Decreased coordination;Decreased knowledge of use of DME;Decreased safety awareness;Cardiopulmonary status limiting activity;Decreased knowledge of precautions       PT Treatment Interventions DME instruction;Gait training;Functional mobility training;Therapeutic activities;Therapeutic exercise;Balance training;Patient/family education    PT Goals (Current goals can be found in the Care Plan section)  Acute Rehab PT Goals Patient Stated Goal: to return home PT Goal Formulation: With patient Time For Goal Achievement: 05/03/17 Potential to Achieve Goals: Fair    Frequency Min 2X/week   Barriers to discharge Decreased caregiver  support      Co-evaluation               AM-PAC PT "6 Clicks" Daily Activity  Outcome Measure Difficulty turning over in bed (including adjusting bedclothes, sheets and blankets)?: Unable Difficulty moving from lying on back to sitting on the side of the bed? : Unable Difficulty sitting down on and standing up from a chair with arms (e.g., wheelchair, bedside commode, etc,.)?: Unable Help needed moving to and from a bed to chair (including a wheelchair)?: A Little Help needed walking in hospital room?: Total Help needed climbing 3-5 steps with a railing? : Total 6 Click Score: 8    End of Session Equipment Utilized During Treatment: Gait belt Activity Tolerance: Patient tolerated treatment well Patient left: in chair;with call bell/phone within reach;with chair alarm set Nurse Communication: Mobility status PT Visit Diagnosis: Difficulty in walking, not elsewhere classified (R26.2);Muscle weakness (generalized) (M62.81)    Time: 2458-0998 PT Time Calculation (min) (ACUTE ONLY): 32 min   Charges:   PT Evaluation $PT Eval Moderate Complexity: 1 Mod PT Treatments $Therapeutic Exercise: 8-22 mins   PT G Codes:   PT G-Codes **NOT FOR INPATIENT CLASS** Functional Assessment Tool Used: AM-PAC 6 Clicks Basic Mobility Functional Limitation: Mobility: Walking and moving around Mobility: Walking and Moving Around Current Status (P3825): At least 40 percent but less than 60 percent impaired, limited or restricted Mobility: Walking and Moving Around Goal Status [phone]): At least 1 percent but less than 20 percent impaired, limited or restricted    [place] H. Owens Shark, PT, DPT,  NCS 04/19/17, 4:26 PM [phone]

## 2017-04-19 NOTE — ED Notes (Signed)
Called and informed pt wife of bed assignment.

## 2017-04-20 LAB — BASIC METABOLIC PANEL
Anion gap: 4 — ABNORMAL LOW (ref 5–15)
BUN: 18 mg/dL (ref 6–20)
CALCIUM: 8.9 mg/dL (ref 8.9–10.3)
CO2: 28 mmol/L (ref 22–32)
CREATININE: 1.05 mg/dL (ref 0.61–1.24)
Chloride: 110 mmol/L (ref 101–111)
GFR calc non Af Amer: >60 mL/min (ref >60)
GLUCOSE: 128 mg/dL — AB (ref 65–99)
Potassium: 3.4 mmol/L — ABNORMAL LOW (ref 3.5–5.1)
Sodium: 142 mmol/L (ref 135–145)

## 2017-04-20 LAB — MAGNESIUM: MAGNESIUM: 1.7 mg/dL (ref 1.7–2.4)

## 2017-04-20 LAB — CBC
HCT: 36.8 % — ABNORMAL LOW (ref 40.0–52.0)
Hemoglobin: 12.4 g/dL — ABNORMAL LOW (ref 13.0–18.0)
MCH: 29 pg (ref 26.0–34.0)
MCHC: 33.7 g/dL (ref 32.0–36.0)
MCV: 85.9 fL (ref 80.0–100.0)
PLATELETS: 243 10*3/uL (ref 150–440)
RBC: 4.29 MIL/uL — AB (ref 4.40–5.90)
RDW: 14.1 % (ref 11.5–14.5)
WBC: 7.6 10*3/uL (ref 3.8–10.6)

## 2017-04-20 MED ORDER — MAGNESIUM SULFATE 2 GM/50ML IV SOLN
2.0000 g | Freq: Once | INTRAVENOUS | Status: AC
  Administered 2017-04-20: 2 g via INTRAVENOUS
  Filled 2017-04-20: qty 50

## 2017-04-20 MED ORDER — MIDODRINE HCL 5 MG PO TABS
5.0000 mg | ORAL_TABLET | Freq: Three times a day (TID) | ORAL | Status: DC
  Administered 2017-04-20 – 2017-04-21 (×5): 5 mg via ORAL
  Filled 2017-04-20 (×6): qty 1

## 2017-04-20 MED ORDER — POTASSIUM CHLORIDE CRYS ER 20 MEQ PO TBCR
40.0000 meq | EXTENDED_RELEASE_TABLET | Freq: Two times a day (BID) | ORAL | Status: DC
  Administered 2017-04-20 – 2017-04-21 (×3): 40 meq via ORAL
  Filled 2017-04-20 (×3): qty 2

## 2017-04-20 NOTE — Progress Notes (Signed)
Dr Leslye Peer said to let telemetry expire

## 2017-04-20 NOTE — Progress Notes (Signed)
Patient ID: Brandon Manning, male   DOB: [birth year], 71 y.o.   MRN: [id number]  [employer] [facility] PROGRESS NOTE  Brandon Manning LGX:[id number] DOB: 1946/04/08 DOA: 04/19/2017 PCP: [name], MD  HPI/Subjective: Patient feeling better today. Decreased in today with the orthostatic vital signs he did not feel as dizzy.  Objective: Vitals:   04/20/17 1558 04/20/17 1640  BP:  (!) 183/84  Pulse: 75 62  Resp:  18  Temp:  98.3 F (36.8 C)  SpO2: 98% 100%    Filed Weights   04/19/17 0609 04/19/17 1138  Weight: 81.6 kg (180 lb) 80.5 kg (177 lb 7.5 oz)    ROS: Review of Systems  Constitutional: Negative for chills and fever.  Eyes: Negative for blurred vision.  Respiratory: Negative for cough and shortness of breath.   Cardiovascular: Negative for chest pain.  Gastrointestinal: Negative for abdominal pain, constipation, diarrhea, nausea and vomiting.  Genitourinary: Negative for dysuria.  Musculoskeletal: Negative for joint pain.  Neurological: Positive for dizziness and weakness. Negative for headaches.   Exam: Physical Exam  Constitutional: He is oriented to person, place, and time.  HENT:  Nose: No mucosal edema.  Mouth/Throat: No oropharyngeal exudate or posterior oropharyngeal edema.  Eyes: Pupils are equal, round, and reactive to light. Conjunctivae, EOM and lids are normal.  Neck: No JVD present. Carotid bruit is not present. No edema present. No thyroid mass and no thyromegaly present.  Cardiovascular: S1 normal and S2 normal.  Exam reveals no gallop.   No murmur heard. Pulses:      Dorsalis pedis pulses are 2+ on the right side, and 2+ on the left side.  Respiratory: No respiratory distress. He has no wheezes. He has no rhonchi. He has no rales.  GI: Soft. Bowel sounds are normal. There is no tenderness.  Musculoskeletal:       Right ankle: He exhibits swelling.       Left ankle: He exhibits swelling.  Lymphadenopathy:    He has no cervical adenopathy.   Neurological: He is alert and oriented to person, place, and time. No cranial nerve deficit.  Skin: Skin is warm. No rash noted. Nails show no clubbing.  Psychiatric: He has a normal mood and affect.      Data Reviewed: Basic Metabolic Panel:  Recent Labs Lab 04/19/17 0613 04/20/17 0541  NA 138 142  K 3.1* 3.4*  CL 100* 110  CO2 29 28  GLUCOSE 136* 128*  BUN 23* 18  CREATININE 1.37* 1.05  CALCIUM 9.4 8.9  MG  --  1.7   CBC:  Recent Labs Lab 04/19/17 0613 04/20/17 0541  WBC 7.7 7.6  HGB 13.1 12.4*  HCT 39.1* 36.8*  MCV 84.7 85.9  PLT 246 243   Cardiac Enzymes:  Recent Labs Lab 04/19/17 0613  TROPONINI <0.03    Scheduled Meds: . aspirin EC  81 mg Oral QODAY  . carbidopa-levodopa  1 tablet Oral Q24H  . carbidopa-levodopa  1 tablet Oral Q24H  . carbidopa-levodopa  2 tablet Oral BH-q7a  . enoxaparin (LOVENOX) injection  40 mg Subcutaneous Q24H  . finasteride  5 mg Oral Daily  . midodrine  5 mg Oral TID WC  . potassium chloride  40 mEq Oral BID   Continuous Infusions: . sodium chloride 50 mL/hr at 04/20/17 1300    Assessment/Plan:  1. Severe orthostatic hypotension. This is likely chronic in this patient with Parkinson's disease. Stop medications that could be contributing including Flomax and hydrochlorothiazide. The patient's resting  blood pressure needs to be high if he is going to drop down this much. Gentle IV fluids. Continue midodrine 5 mg 3 times a day. Potentially cut back to 2.5 mg 3 times a day upon discharge home. 2. Hypokalemia replace potassium. Replace magnesium also 3. Parkinson's disease on Sinemet 4. BPH on finasteride  Code Status:     Code Status Orders        Start     Ordered   04/19/17 1019  Do not attempt resuscitation (DNR)  Continuous    Question Answer Comment  In the event of cardiac or respiratory ARREST Do not call a "code blue"   In the event of cardiac or respiratory ARREST Do not perform Intubation, CPR,  defibrillation or ACLS   In the event of cardiac or respiratory ARREST Use medication by any route, position, wound care, and other measures to relive pain and suffering. May use oxygen, suction and manual treatment of airway obstruction as needed for comfort.   Comments nurse may pronounce      04/19/17 1019    Code Status History    Date Active Date Inactive Code Status Order ID Comments User Context   01/25/2017  5:55 PM 01/28/2017  2:48 PM Full Code [id number]  Hillary Bow, MD ED    Advance Directive Documentation     Most Recent Value  Type of Advance Directive  Healthcare Power of Attorney, Living will  Pre-existing out of facility DNR order (yellow form or pink MOST form)  -  "MOST" Form in Place?  -     Family Communication: Wife at the bedside Disposition Plan:  Home soon  Time spent: 28 minutes  [place], [place]

## 2017-04-20 NOTE — Progress Notes (Signed)
Physical Therapy Treatment Patient Details Name: Brandon Manning MRN: [id number] DOB: 1945/08/28 Today's Date: 04/20/2017    History of Present Illness presented to ER secondary to worsening dizziness, weakness and fall; admitted for management of symptomatic orthostastic hypotension.  PMH significant for HTN, parkinsons.    PT Comments    Pt agreeable to PT; denies pain. Pt demonstrating improved bed mobility, transfers and gait with less assist required and improved balance. Globally Min A for mobility with progression of ambulation to 40 ft; difficulty with turns. Pt participates in seated edge of bed exercises as well. Pt declined sitting in recliner due to discomfort experienced yesterday in recliner. Continue PT to progress strength and endurance to improve all functional mobility.    Follow Up Recommendations  SNF     Equipment Recommendations  Rolling walker with 5" wheels    Recommendations for Other Services       Precautions / Restrictions Precautions Precautions: Fall Restrictions Weight Bearing Restrictions: No    Mobility  Bed Mobility Overal bed mobility: Needs Assistance Bed Mobility: Supine to Sit;Sit to Supine     Supine to sit: Min guard Sit to supine: Min assist   General bed mobility comments: Slow to move, but able to sit without physical assist, min guard for safety. Requires assist for LEs to return to supine. Repositions self upward in bed   Transfers Overall transfer level: Needs assistance Equipment used: Rolling walker (2 wheeled) Transfers: Sit to/from Stand Sit to Stand: Min assist         General transfer comment: cues for safe hand placement. Assist for initiation of lift off and Min guard thereafter.   Ambulation/Gait Ambulation/Gait assistance: Min guard;Min assist Ambulation Distance (Feet): 40 Feet Assistive device: Rolling walker (2 wheeled) Gait Pattern/deviations: Step-through pattern;Narrow base of support (limited partial  step through) Gait velocity: slow (may be baseline due to Parkinson's) Gait velocity interpretation: <1.8 ft/sec, indicative of risk for recurrent falls General Gait Details: Significantly decreased stride and significant narrow BOS. Steady expect with turn requires Min A for steadiness and cues for step sequence.    Stairs            Wheelchair Mobility    Modified Rankin (Stroke Patients Only)       Balance Overall balance assessment: Needs assistance Sitting-balance support: Bilateral upper extremity supported;Feet supported Sitting balance-Leahy Scale: Good     Standing balance support: Bilateral upper extremity supported Standing balance-Leahy Scale: Fair                              Cognition Arousal/Alertness: Awake/alert Behavior During Therapy: WFL for tasks assessed/performed Overall Cognitive Status: Within Functional Limits for tasks assessed                                        Exercises General Exercises - Lower Extremity Long Arc Quad: AROM;Both;Seated;20 reps Hip ABduction/ADduction: Strengthening;Both;Seated;20 reps Straight Leg Raises: AAROM;Both;Seated;15 reps Hip Flexion/Marching: AROM;Both;20 reps;Seated Toe Raises: AROM;Both;20 reps;Seated Heel Raises: AROM;Both;20 reps;Seated    General Comments        Pertinent Vitals/Pain Pain Assessment: No/denies pain    Home Living                      Prior Function            PT Goals (current  goals can now be found in the care plan section)      Frequency    Min 2X/week      PT Plan Current plan remains appropriate    Co-evaluation              AM-PAC PT "6 Clicks" Daily Activity  Outcome Measure  Difficulty turning over in bed (including adjusting bedclothes, sheets and blankets)?: A Little Difficulty moving from lying on back to sitting on the side of the bed? : Unable Difficulty sitting down on and standing up from a chair with  arms (e.g., wheelchair, bedside commode, etc,.)?: Unable Help needed moving to and from a bed to chair (including a wheelchair)?: A Little Help needed walking in hospital room?: A Little Help needed climbing 3-5 steps with a railing? : A Lot 6 Click Score: 13    End of Session Equipment Utilized During Treatment: Gait belt Activity Tolerance: Patient tolerated treatment well Patient left: in bed;with call bell/phone within reach;with bed alarm set Nurse Communication: Other (comment) (possible IV leaking) PT Visit Diagnosis: Difficulty in walking, not elsewhere classified (R26.2);Muscle weakness (generalized) (M62.81)     Time: 8588-5027 PT Time Calculation (min) (ACUTE ONLY): 30 min  Charges:  $Gait Training: 8-22 mins $Therapeutic Exercise: 8-22 mins                    G Codes:  Functional Assessment Tool Used: AM-PAC 6 Clicks Basic Mobility     Larae Grooms, PTA 04/20/2017, 4:42 PM

## 2017-04-21 LAB — BASIC METABOLIC PANEL
ANION GAP: 5 (ref 5–15)
BUN: 13 mg/dL (ref 6–20)
CALCIUM: 8.9 mg/dL (ref 8.9–10.3)
CHLORIDE: 109 mmol/L (ref 101–111)
CO2: 26 mmol/L (ref 22–32)
Creatinine, Ser: 0.85 mg/dL (ref 0.61–1.24)
GFR calc Af Amer: >60 mL/min (ref >60)
GFR calc non Af Amer: >60 mL/min (ref >60)
GLUCOSE: 112 mg/dL — AB (ref 65–99)
Potassium: 4 mmol/L (ref 3.5–5.1)
Sodium: 140 mmol/L (ref 135–145)

## 2017-04-21 LAB — MAGNESIUM: Magnesium: 1.9 mg/dL (ref 1.7–2.4)

## 2017-04-21 MED ORDER — MIDODRINE HCL 5 MG PO TABS: 5.0000 mg | ORAL_TABLET | Freq: Three times a day (TID) | ORAL | 0 refills | Status: DC

## 2017-04-21 MED ORDER — POLYETHYLENE GLYCOL 3350 17 GM/SCOOP PO POWD: 1.0000 | Freq: Every day | ORAL | Status: DC | PRN

## 2017-04-21 NOTE — Care Management Important Message (Signed)
Important Message  Patient Details  Name: Brandon Manning MRN: [id number] Date of Birth: Sep 05, 1945   Medicare Important Message Given:  N/A - LOS <3 / Initial given by admissions    Beverly Sessions, RN 04/21/2017, 2:47 PM

## 2017-04-21 NOTE — Care Management (Signed)
Patient admitted from home for orthostatic hypotension.  Patient lives at home with wife.  PCP GUTIERREZ.  Patient open with [facility] for RN, OT, and PT.  Patient has RW, lift chair, and shower chair in the home. PT has assessed patient and recommends SNF.  Patient declines and wishes to return home with resumption of home health services.  Orders have been placed, and notified Sarah with [facility].  RNCM signing off.

## 2017-04-21 NOTE — Progress Notes (Signed)
Physical Therapy Treatment Patient Details Name: Brandon Manning MRN: [id number] DOB: 1946-03-12 Today's Date: 04/21/2017    History of Present Illness presented to ER secondary to worsening dizziness, weakness and fall; admitted for management of symptomatic orthostastic hypotension.  PMH significant for HTN, parkinsons.    PT Comment   Reports feeling better.  Orthostatic BP's monitored - see vitals chart for full details  154/89 P 62 down to 75/50 P 97  standing after 3 minutes.  Upon return to supine 141/90  P 70.  Pt symptomatic but does not report unless questioned.  Overall mobility with min assist for tasks.  Gait deferred but he did participate in supine exercises. Discussed with primary nurse.     Follow Up Recommendations  SNF     Equipment Recommendations  Rolling walker with 5" wheels    Recommendations for Other Services       Precautions / Restrictions Precautions Precautions: Fall Restrictions Weight Bearing Restrictions: No    Mobility  Bed Mobility Overal bed mobility: Needs Assistance Bed Mobility: Supine to Sit;Sit to Supine     Supine to sit: Min assist Sit to supine: Min assist      Transfers Overall transfer level: Needs assistance Equipment used: Rolling walker (2 wheeled) Transfers: Sit to/from Stand Sit to Stand: Min assist         General transfer comment: cues for safe hand placement. Assist for initiation of lift off and Min guard thereafter.   Ambulation/Gait             General Gait Details: gait deferred due to orthostatic BP - symptomatic   Stairs            Wheelchair Mobility    Modified Rankin (Stroke Patients Only)       Balance Overall balance assessment: Needs assistance Sitting-balance support: Bilateral upper extremity supported;Feet supported Sitting balance-Leahy Scale: Good     Standing balance support: Bilateral upper extremity supported Standing balance-Leahy Scale: Fair                              Cognition Arousal/Alertness: Awake/alert Behavior During Therapy: WFL for tasks assessed/performed Overall Cognitive Status: Within Functional Limits for tasks assessed                                        Exercises Other Exercises Other Exercises: supine A/AAROM ankle pumps, heel slides, SLR, ab/add 2 x 10    General Comments        Pertinent Vitals/Pain Pain Assessment: No/denies pain    Home Living                      Prior Function            PT Goals (current goals can now be found in the care plan section) Progress towards PT goals: Progressing toward goals    Frequency    Min 2X/week      PT Plan Current plan remains appropriate    Co-evaluation              AM-PAC PT "6 Clicks" Daily Activity  Outcome Measure  Difficulty turning over in bed (including adjusting bedclothes, sheets and blankets)?: A Little Difficulty moving from lying on back to sitting on the side of the bed? : Unable Difficulty sitting down on and  standing up from a chair with arms (e.g., wheelchair, bedside commode, etc,.)?: Unable Help needed moving to and from a bed to chair (including a wheelchair)?: A Little Help needed walking in hospital room?: A Little Help needed climbing 3-5 steps with a railing? : A Lot 6 Click Score: 13    End of Session Equipment Utilized During Treatment: Gait belt Activity Tolerance: Treatment limited secondary to medical complications (Comment) Patient left: in bed;with bed alarm set;with call bell/phone within reach Nurse Communication: Other (comment)       Time: 7619-5093 PT Time Calculation (min) (ACUTE ONLY): 23 min  Charges:  $Therapeutic Exercise: 8-22 mins $Therapeutic Activity: 8-22 mins                    G Codes:       [name], PTA 04/21/17, 10:14 AM

## 2017-04-21 NOTE — Telephone Encounter (Signed)
Letter was picked up

## 2017-04-21 NOTE — Discharge Summary (Signed)
[facility] [place] NAME: Brandon Manning    MR#:  [id number]  DATE OF BIRTH:  06/25/1946  DATE OF ADMISSION:  04/19/2017 ADMITTING PHYSICIAN: Loletha Grayer, MD  DATE OF DISCHARGE: 04/21/2017  PRIMARY CARE PHYSICIAN: [name], MD    ADMISSION DIAGNOSIS:  Orthostatic hypotension [I95.1] Dehydration [E86.0]  DISCHARGE DIAGNOSIS:  Active Problems:   Orthostatic hypotension   SECONDARY DIAGNOSIS:   Past Medical History:  Diagnosis Date  . Constipation   . ED (erectile dysfunction)   . History of chicken pox   . HTN (hypertension) 09/09/2011  . Parkinson disease ([place]) 02/01/2014   Has been prescribed rollator walker Established with Dr Tat (01/2016)     HOSPITAL COURSE:   71 year old male with Parkinson's disease who presents with severe orthostatic hypotension.  1. Severe orthostatic hypotension in the setting of Parkinson's disease as well as HCTZ and Flomax These medications have been discontinued. Continue midodrine 5 mg 3 times a day Reviewed orthostatic hypotension instructions with family.  2. Electrolyte abnormalities: This was repleted including potassium and magnesium  3. Parkinson's disease: Patient will continue Sinemet and will have follow-up with his neurologist  4. BPH: Patient continue finasteride however due to concern of orthostatic hypotension Flomax has been discontinued.   DISCHARGE CONDITIONS AND DIET:  Stable for discharge on regular diet  CONSULTS OBTAINED:    DRUG ALLERGIES:   Allergies  Allergen Reactions  . Penicillins Rash    Has patient had a PCN reaction causing immediate rash, facial/tongue/throat swelling, SOB or lightheadedness with hypotension: No Has patient had a PCN reaction causing severe rash involving mucus membranes or skin necrosis: No Has patient had a PCN reaction that required hospitalization No Has patient had a PCN reaction occurring within the last 10 years: No If  all of the above answers are "NO", then may proceed with Cephalosporin use.    DISCHARGE MEDICATIONS:   Current Discharge Medication List    START taking these medications   Details  midodrine (PROAMATINE) 5 MG tablet Take 1 tablet (5 mg total) by mouth 3 (three) times daily with meals. Qty: 90 tablet, Refills: 0      CONTINUE these medications which have CHANGED   Details  polyethylene glycol powder (MIRALAX) powder Take 255 g by mouth daily as needed.      CONTINUE these medications which have NOT CHANGED   Details  aspirin EC 81 MG tablet Take 81 mg by mouth every other day.    carbidopa-levodopa (SINEMET IR) 25-100 MG tablet 2 tablet at 7 am, 1 at 12 pm, 1 at 5 pm Qty: 360 tablet, Refills: 1   Associated Diagnoses: PD (Parkinson's disease) (HCC)    finasteride (PROSCAR) 5 MG tablet Take 1 tablet (5 mg total) by mouth daily. Qty: 90 tablet, Refills: 1    Multiple Vitamins-Minerals (MULTIVITAMIN PO) Take 1 tablet by mouth daily.    HYDROcodone-acetaminophen (NORCO/VICODIN) 5-325 MG tablet Take 0.5 tablets by mouth 2 (two) times daily as needed for moderate pain. Qty: 30 tablet, Refills: 0      STOP taking these medications     hydrochlorothiazide (MICROZIDE) 12.5 MG capsule      tamsulosin (FLOMAX) 0.4 MG CAPS capsule           Today   CHIEF COMPLAINT:  No acute events overnight.   VITAL SIGNS:  Blood pressure (!) 173/77, pulse 66, temperature 97.9 F (36.6 C), temperature source Oral, resp. rate 18, height 5\' 10"  (1.778  m), weight 80.5 kg (177 lb 7.5 oz), SpO2 100 %.   REVIEW OF SYSTEMS:  Review of Systems  Constitutional: Negative.  Negative for chills, fever and malaise/fatigue.  HENT: Negative.  Negative for ear discharge, ear pain, hearing loss, nosebleeds and sore throat.   Eyes: Negative.  Negative for blurred vision and pain.  Respiratory: Negative.  Negative for cough, hemoptysis, shortness of breath and wheezing.   Cardiovascular: Negative.   Negative for chest pain, palpitations and leg swelling.  Gastrointestinal: Negative.  Negative for abdominal pain, blood in stool, diarrhea, nausea and vomiting.  Genitourinary: Negative.  Negative for dysuria.  Musculoskeletal: Negative.  Negative for back pain.  Skin: Negative.   Neurological: Negative for dizziness, tremors, speech change, focal weakness, seizures and headaches.       Parkinson's with pill-rolling tremor  Endo/Heme/Allergies: Negative.  Does not bruise/bleed easily.  Psychiatric/Behavioral: Negative.  Negative for depression, hallucinations and suicidal ideas.     PHYSICAL EXAMINATION:  GENERAL:  71 y.o.-year-old patient lying in the bed with no acute distress.  Parkinsonian face NECK:  Supple, no jugular venous distention. No thyroid enlargement, no tenderness.  LUNGS: Normal breath sounds bilaterally, no wheezing, rales,rhonchi  No use of accessory muscles of respiration.  CARDIOVASCULAR: S1, S2 normal. No murmurs, rubs, or gallops.  ABDOMEN: Soft, non-tender, non-distended. Bowel sounds present. No organomegaly or mass.  EXTREMITIES: No pedal edema, cyanosis, or clubbing.  PSYCHIATRIC: The patient is alert and oriented x 3. Ill-rolling tremor present SKIN: No obvious rash, lesion, or ulcer.   DATA REVIEW:   CBC  Recent Labs Lab 04/20/17 0541  WBC 7.6  HGB 12.4*  HCT 36.8*  PLT 243    Chemistries   Recent Labs Lab 04/21/17 0449  NA 140  K 4.0  CL 109  CO2 26  GLUCOSE 112*  BUN 13  CREATININE 0.85  CALCIUM 8.9  MG 1.9    Cardiac Enzymes  Recent Labs Lab 04/19/17 0613  TROPONINI <0.03    Microbiology Results  @MICRORSLT48 @  RADIOLOGY:  No results found.    Current Discharge Medication List    START taking these medications   Details  midodrine (PROAMATINE) 5 MG tablet Take 1 tablet (5 mg total) by mouth 3 (three) times daily with meals. Qty: 90 tablet, Refills: 0      CONTINUE these medications which have CHANGED    Details  polyethylene glycol powder (MIRALAX) powder Take 255 g by mouth daily as needed.      CONTINUE these medications which have NOT CHANGED   Details  aspirin EC 81 MG tablet Take 81 mg by mouth every other day.    carbidopa-levodopa (SINEMET IR) 25-100 MG tablet 2 tablet at 7 am, 1 at 12 pm, 1 at 5 pm Qty: 360 tablet, Refills: 1   Associated Diagnoses: PD (Parkinson's disease) (HCC)    finasteride (PROSCAR) 5 MG tablet Take 1 tablet (5 mg total) by mouth daily. Qty: 90 tablet, Refills: 1    Multiple Vitamins-Minerals (MULTIVITAMIN PO) Take 1 tablet by mouth daily.    HYDROcodone-acetaminophen (NORCO/VICODIN) 5-325 MG tablet Take 0.5 tablets by mouth 2 (two) times daily as needed for moderate pain. Qty: 30 tablet, Refills: 0      STOP taking these medications     hydrochlorothiazide (MICROZIDE) 12.5 MG capsule      tamsulosin (FLOMAX) 0.4 MG CAPS capsule             Management plans discussed with the patient and wife and they  are  in agreement. Stable for discharge   Patient should follow up with pcp  CODE STATUS:     Code Status Orders        Start     Ordered   04/19/17 1019  Do not attempt resuscitation (DNR)  Continuous    Question Answer Comment  In the event of cardiac or respiratory ARREST Do not call a "code blue"   In the event of cardiac or respiratory ARREST Do not perform Intubation, CPR, defibrillation or ACLS   In the event of cardiac or respiratory ARREST Use medication by any route, position, wound care, and other measures to relive pain and suffering. May use oxygen, suction and manual treatment of airway obstruction as needed for comfort.   Comments nurse may pronounce      04/19/17 1019    Code Status History    Date Active Date Inactive Code Status Order ID Comments User Context   01/25/2017  5:55 PM 01/28/2017  2:48 PM Full Code [id number]  Hillary Bow, MD ED    Advance Directive Documentation     Most Recent Value  Type of  Advance Directive  Healthcare Power of [place], Living will  Pre-existing out of facility DNR order (yellow form or pink MOST form)  -  "MOST" Form in Place?  -      TOTAL TIME TAKING CARE OF THIS PATIENT: 37 minutes.    Note: This dictation was prepared with Dragon dictation along with smaller phrase technology. Any transcriptional errors that result from this process are unintentional.  Kinsley Nicklaus M.D on 04/21/2017 at 11:10 AM  Between 7am to 6pm - Pager - [phone] After 6pm go to [email or web address] - password [email or web address] [facility]  Office  [phone]  CC: Primary care physician; [name], MD

## 2017-04-21 NOTE — Progress Notes (Signed)
Patient alert, vital signs stable, no acute distress noted. Patient discharged to home as ordered, discharge instructions and follow up appointments given as ordered. Patient wife at the bedside and given discharge instructions. Patient instructed to sit up slowly and then take a few minutes to stand so that he will not become light headed as ordered. Patient denies pain at this time. Wife at the bedside to take patient home.

## 2017-04-22 ENCOUNTER — Telehealth: Payer: Self-pay | Admitting: *Deleted

## 2017-04-22 NOTE — Telephone Encounter (Signed)
Transition Care Management Follow-up Telephone Call   Date discharged? 04/21/2017   How have you been since you were released from the hospital? "ok"   Do you understand why you were in the hospital? yes   Do you understand the discharge instructions? yes   Where were you discharged to? home   Items Reviewed:  Medications reviewed: yes  Allergies reviewed: yes  Dietary changes reviewed: n/a  Referrals reviewed: n/a   Functional Questionnaire:   Activities of Daily Living (ADLs):   States they require assistance with the following: all areas, but wife is there for assisitance   Any transportation issues/concerns?: no, wife makes arrangements for pt to go to and from all appts   Any patient concerns? no   Confirmed importance and date/time of follow-up visits scheduled yes  Provider Appointment booked with Dr [name], 04/28/2017   Confirmed with patient if condition begins to worsen call PCP or go to the ER.  Patient was given the office number and encouraged to call back with question or concerns.  : yes

## 2017-04-25 DIAGNOSIS — I10 Essential (primary) hypertension: Secondary | ICD-10-CM | POA: Diagnosis not present

## 2017-04-25 DIAGNOSIS — N401 Enlarged prostate with lower urinary tract symptoms: Secondary | ICD-10-CM | POA: Diagnosis not present

## 2017-04-25 DIAGNOSIS — N138 Other obstructive and reflux uropathy: Secondary | ICD-10-CM | POA: Diagnosis not present

## 2017-04-25 DIAGNOSIS — Z9181 History of falling: Secondary | ICD-10-CM | POA: Diagnosis not present

## 2017-04-25 DIAGNOSIS — G20 Parkinson's disease: Secondary | ICD-10-CM | POA: Diagnosis not present

## 2017-04-25 DIAGNOSIS — I951 Orthostatic hypotension: Secondary | ICD-10-CM | POA: Diagnosis not present

## 2017-04-25 DIAGNOSIS — Z79891 Long term (current) use of opiate analgesic: Secondary | ICD-10-CM | POA: Diagnosis not present

## 2017-04-27 DIAGNOSIS — I951 Orthostatic hypotension: Secondary | ICD-10-CM | POA: Diagnosis not present

## 2017-04-27 DIAGNOSIS — G20 Parkinson's disease: Secondary | ICD-10-CM | POA: Diagnosis not present

## 2017-04-27 DIAGNOSIS — N401 Enlarged prostate with lower urinary tract symptoms: Secondary | ICD-10-CM | POA: Diagnosis not present

## 2017-04-27 DIAGNOSIS — N138 Other obstructive and reflux uropathy: Secondary | ICD-10-CM | POA: Diagnosis not present

## 2017-04-27 DIAGNOSIS — Z9181 History of falling: Secondary | ICD-10-CM | POA: Diagnosis not present

## 2017-04-27 DIAGNOSIS — I10 Essential (primary) hypertension: Secondary | ICD-10-CM | POA: Diagnosis not present

## 2017-04-28 ENCOUNTER — Encounter: Payer: Self-pay | Admitting: Family Medicine

## 2017-04-28 ENCOUNTER — Ambulatory Visit (INDEPENDENT_AMBULATORY_CARE_PROVIDER_SITE_OTHER): Payer: Medicare Other | Admitting: Family Medicine

## 2017-04-28 ENCOUNTER — Telehealth: Payer: Self-pay

## 2017-04-28 VITALS — BP 138/90 | HR 70 | Temp 98.2°F | Wt 180.0 lb

## 2017-04-28 DIAGNOSIS — N138 Other obstructive and reflux uropathy: Secondary | ICD-10-CM | POA: Diagnosis not present

## 2017-04-28 DIAGNOSIS — N401 Enlarged prostate with lower urinary tract symptoms: Secondary | ICD-10-CM | POA: Diagnosis not present

## 2017-04-28 DIAGNOSIS — G20 Parkinson's disease: Secondary | ICD-10-CM | POA: Diagnosis not present

## 2017-04-28 DIAGNOSIS — I951 Orthostatic hypotension: Secondary | ICD-10-CM

## 2017-04-28 NOTE — Telephone Encounter (Signed)
Agree w this. Thx

## 2017-04-28 NOTE — Progress Notes (Signed)
BP 138/90 (BP Location: Right Arm, Cuff Size: Normal)   Pulse 70   Temp 98.2 F (36.8 C) (Oral)   Wt 180 lb (81.6 kg)   SpO2 95%   BMI 25.83 kg/m    CC: TCM visit Subjective:    Patient ID: Brandon Manning, male    DOB: 1946-06-10, 71 y.o.   MRN: [id number]  HPI: Brandon Manning is a 71 y.o. male presenting on 04/28/2017 for Hospitalization Follow-up [facility], d/c 04/22/17)   Recent hospitalization last month after episode of syncope found to be severely orthostatic (SBP 50s on standing). Flomax was stopped. Hctz was stopped. Started on midodrine 5mg  TID.   Notes increasing pedal edema since stopping HCTZ.  Notes nocturia x2-3 and some incomplete emptying since stopping flomax. Denies dysuria, urgency. UA in hospital was ok.  No more falls since he's been home. Denies dizziness since home.  At home sitting BP running 150/90s.  [facility] nurse, PT, OT involved.   Date of admission: 04/19/2017 Date of discharge: 04/21/2017 Hospital f/u TCM phone call done: 04/22/2017  D/C Dx: Active Problems:   Orthostatic hypotension  Relevant past medical, surgical, family and social history reviewed and updated as indicated. Interim medical history since our last visit reviewed. Allergies and medications reviewed and updated. Outpatient Medications Prior to Visit  Medication Sig Dispense Refill  . aspirin EC 81 MG tablet Take 81 mg by mouth every other day.    . carbidopa-levodopa (SINEMET IR) 25-100 MG tablet 2 tablet at 7 am, 1 at 12 pm, 1 at 5 pm 360 tablet 1  . finasteride (PROSCAR) 5 MG tablet Take 1 tablet (5 mg total) by mouth daily. 90 tablet 1  . HYDROcodone-acetaminophen (NORCO/VICODIN) 5-325 MG tablet Take 0.5 tablets by mouth 2 (two) times daily as needed for moderate pain. 30 tablet 0  . Multiple Vitamins-Minerals (MULTIVITAMIN PO) Take 1 tablet by mouth daily.    . polyethylene glycol powder (MIRALAX) powder Take 255 g by mouth daily as needed.    . midodrine (PROAMATINE) 5  MG tablet Take 1 tablet (5 mg total) by mouth 3 (three) times daily with meals. 90 tablet 0   No facility-administered medications prior to visit.      Per HPI unless specifically indicated in ROS section below Review of Systems     Objective:    BP 138/90 (BP Location: Right Arm, Cuff Size: Normal)   Pulse 70   Temp 98.2 F (36.8 C) (Oral)   Wt 180 lb (81.6 kg)   SpO2 95%   BMI 25.83 kg/m   Wt Readings from Last 3 Encounters:  04/28/17 180 lb (81.6 kg)  04/19/17 177 lb 7.5 oz (80.5 kg)  03/11/17 181 lb (82.1 kg)    Physical Exam  Constitutional: He appears well-developed and well-nourished. No distress.  HENT:  Head: Normocephalic and atraumatic.  Mouth/Throat: Oropharynx is clear and moist. No oropharyngeal exudate.  Eyes: Pupils are equal, round, and reactive to light. Conjunctivae and EOM are normal.  Cardiovascular: Normal rate, regular rhythm, normal heart sounds and intact distal pulses.   No murmur heard. Pulmonary/Chest: Effort normal and breath sounds normal. No respiratory distress. He has no wheezes. He has no rales.  Musculoskeletal: He exhibits no edema.  Skin: Skin is warm and dry. No rash noted.  Psychiatric: He has a normal mood and affect.  Nursing note and vitals reviewed.  Results for orders placed or performed during the hospital encounter of 78/24/23  Basic metabolic panel  Result Value Ref Range   Sodium 138 135 - 145 mmol/L   Potassium 3.1 (L) 3.5 - 5.1 mmol/L   Chloride 100 (L) 101 - 111 mmol/L   CO2 29 22 - 32 mmol/L   Glucose, Bld 136 (H) 65 - 99 mg/dL   BUN 23 (H) 6 - 20 mg/dL   Creatinine, Ser 1.37 (H) 0.61 - 1.24 mg/dL   Calcium 9.4 8.9 - 10.3 mg/dL   GFR calc non Af Amer 50 (L) >60 mL/min   GFR calc Af Amer 57 (L) >60 mL/min   Anion gap 9 5 - 15  CBC  Result Value Ref Range   WBC 7.7 3.8 - 10.6 K/uL   RBC 4.62 4.40 - 5.90 MIL/uL   Hemoglobin 13.1 13.0 - 18.0 g/dL   HCT 39.1 (L) 40.0 - 52.0 %   MCV 84.7 80.0 - 100.0 fL   MCH  28.4 26.0 - 34.0 pg   MCHC 33.5 32.0 - 36.0 g/dL   RDW 13.9 11.5 - 14.5 %   Platelets 246 150 - 440 K/uL  Urinalysis, Complete w Microscopic  Result Value Ref Range   Color, Urine YELLOW (A) YELLOW   APPearance CLEAR (A) CLEAR   Specific Gravity, Urine 1.017 1.005 - 1.030   pH 6.0 5.0 - 8.0   Glucose, UA NEGATIVE NEGATIVE mg/dL   Hgb urine dipstick SMALL (A) NEGATIVE   Bilirubin Urine NEGATIVE NEGATIVE   Ketones, ur 5 (A) NEGATIVE mg/dL   Protein, ur NEGATIVE NEGATIVE mg/dL   Nitrite NEGATIVE NEGATIVE   Leukocytes, UA NEGATIVE NEGATIVE   RBC / HPF 0-5 0 - 5 RBC/hpf   WBC, UA 0-5 0 - 5 WBC/hpf   Bacteria, UA NONE SEEN NONE SEEN   Squamous Epithelial / LPF 0-5 (A) NONE SEEN   Mucus PRESENT   Troponin I  Result Value Ref Range   Troponin I <0.03 <0.03 ng/mL  Basic metabolic panel  Result Value Ref Range   Sodium 142 135 - 145 mmol/L   Potassium 3.4 (L) 3.5 - 5.1 mmol/L   Chloride 110 101 - 111 mmol/L   CO2 28 22 - 32 mmol/L   Glucose, Bld 128 (H) 65 - 99 mg/dL   BUN 18 6 - 20 mg/dL   Creatinine, Ser 1.05 0.61 - 1.24 mg/dL   Calcium 8.9 8.9 - 10.3 mg/dL   GFR calc non Af Amer >60 >60 mL/min   GFR calc Af Amer >60 >60 mL/min   Anion gap 4 (L) 5 - 15  CBC  Result Value Ref Range   WBC 7.6 3.8 - 10.6 K/uL   RBC 4.29 (L) 4.40 - 5.90 MIL/uL   Hemoglobin 12.4 (L) 13.0 - 18.0 g/dL   HCT 36.8 (L) 40.0 - 52.0 %   MCV 85.9 80.0 - 100.0 fL   MCH 29.0 26.0 - 34.0 pg   MCHC 33.7 32.0 - 36.0 g/dL   RDW 14.1 11.5 - 14.5 %   Platelets 243 150 - 440 K/uL  Magnesium  Result Value Ref Range   Magnesium 1.7 1.7 - 2.4 mg/dL  Basic metabolic panel  Result Value Ref Range   Sodium 140 135 - 145 mmol/L   Potassium 4.0 3.5 - 5.1 mmol/L   Chloride 109 101 - 111 mmol/L   CO2 26 22 - 32 mmol/L   Glucose, Bld 112 (H) 65 - 99 mg/dL   BUN 13 6 - 20 mg/dL   Creatinine, Ser 0.85 0.61 - 1.24 mg/dL  Calcium 8.9 8.9 - 10.3 mg/dL   GFR calc non Af Amer >60 >60 mL/min   GFR calc Af Amer >60 >60  mL/min   Anion gap 5 5 - 15  Magnesium  Result Value Ref Range   Magnesium 1.9 1.7 - 2.4 mg/dL      Assessment & Plan:   Problem List Items Addressed This Visit    BPH with obstruction/lower urinary tract symptoms    Continue finasteride. Now off flomax due to orthostatic hypotension. Discussed flomax was probably the best alpha blocker to use if necessary, but for now will stay off this.       Orthostasis - Primary    Similar hospitalization 01/2017, at which time flomax and hctz were also discontinued, somehow he was restarted on these. Again discussed stopping both. Encouraged use of compression stockings for both orthostasis and to help manage pedal edema. Discussed importance of good hydration status. Pt and wife agree with plan. Keep scheduled f/u appt 07/2017      Relevant Medications   midodrine (PROAMATINE) 5 MG tablet   Orthostatic hypotension    Midodrine continued at 5mg  TID. However BP on check in office today 170/110, it does drop to 138/90 on standing. I will ask them to decrease midodrine to 2.5mg  TID and monitor effect. If ongoing orthostasis with standing, will increase back to 5mg  TID. He is now off hctz and flomax.      Relevant Medications   midodrine (PROAMATINE) 5 MG tablet   Parkinson disease ([place])    Appreciate neuro care.           Follow up plan: No Follow-up on file.  [name], MD

## 2017-04-28 NOTE — Patient Instructions (Addendum)
Start using compression stockings. Elevate legs, ensure good hydration with plenty of water. Stay off water pill hydrochlorothiazide.  If urinary issues are worsening or very bothersome, we could re-trial flomax.  Continue working with home health therapy Keep follow up appointment, call me with any questions

## 2017-04-28 NOTE — Telephone Encounter (Signed)
Felicity Coyer, OT with [facility] is requesting verbal order for OT for pt. 1 week 1, then 2 weeks 3.  She can be reached at [phone] and gives permission to leave vm.

## 2017-04-29 NOTE — Assessment & Plan Note (Addendum)
Midodrine continued at 5mg  TID. However BP on check in office today 170/110, it does drop to 138/90 on standing. I will ask them to decrease midodrine to 2.5mg  TID and monitor effect. If ongoing orthostasis with standing, will increase back to 5mg  TID. He is now off hctz and flomax.

## 2017-04-29 NOTE — Telephone Encounter (Signed)
Spoke with [facility] per Dr. Darnell Level.

## 2017-04-29 NOTE — Assessment & Plan Note (Signed)
Similar hospitalization 01/2017, at which time flomax and hctz were also discontinued, somehow he was restarted on these. Again discussed stopping both. Encouraged use of compression stockings for both orthostasis and to help manage pedal edema. Discussed importance of good hydration status. Pt and wife agree with plan. Keep scheduled f/u appt 07/2017

## 2017-04-29 NOTE — Assessment & Plan Note (Signed)
Appreciate neuro care.

## 2017-04-29 NOTE — Assessment & Plan Note (Addendum)
Continue finasteride. Now off flomax due to orthostatic hypotension. Discussed flomax was probably the best alpha blocker to use if necessary, but for now will stay off this.

## 2017-05-02 DIAGNOSIS — N138 Other obstructive and reflux uropathy: Secondary | ICD-10-CM | POA: Diagnosis not present

## 2017-05-02 DIAGNOSIS — Z9181 History of falling: Secondary | ICD-10-CM | POA: Diagnosis not present

## 2017-05-02 DIAGNOSIS — N401 Enlarged prostate with lower urinary tract symptoms: Secondary | ICD-10-CM | POA: Diagnosis not present

## 2017-05-02 DIAGNOSIS — I951 Orthostatic hypotension: Secondary | ICD-10-CM | POA: Diagnosis not present

## 2017-05-02 DIAGNOSIS — G20 Parkinson's disease: Secondary | ICD-10-CM | POA: Diagnosis not present

## 2017-05-02 DIAGNOSIS — I10 Essential (primary) hypertension: Secondary | ICD-10-CM | POA: Diagnosis not present

## 2017-05-03 ENCOUNTER — Telehealth: Payer: Self-pay

## 2017-05-03 DIAGNOSIS — I951 Orthostatic hypotension: Secondary | ICD-10-CM | POA: Diagnosis not present

## 2017-05-03 DIAGNOSIS — G20 Parkinson's disease: Secondary | ICD-10-CM | POA: Diagnosis not present

## 2017-05-03 DIAGNOSIS — N138 Other obstructive and reflux uropathy: Secondary | ICD-10-CM | POA: Diagnosis not present

## 2017-05-03 DIAGNOSIS — I10 Essential (primary) hypertension: Secondary | ICD-10-CM | POA: Diagnosis not present

## 2017-05-03 DIAGNOSIS — Z9181 History of falling: Secondary | ICD-10-CM | POA: Diagnosis not present

## 2017-05-03 DIAGNOSIS — N401 Enlarged prostate with lower urinary tract symptoms: Secondary | ICD-10-CM | POA: Diagnosis not present

## 2017-05-03 NOTE — Telephone Encounter (Signed)
Amy PT with [facility] left v/m requesting verbal orders for [facility] PT 1 x a week for 1 week; 2 x a week for 3 weeks and then 1 x a week for 1 week.

## 2017-05-03 NOTE — Telephone Encounter (Signed)
Agree with this. Thanks.  

## 2017-05-04 DIAGNOSIS — G20 Parkinson's disease: Secondary | ICD-10-CM | POA: Diagnosis not present

## 2017-05-04 DIAGNOSIS — Z9181 History of falling: Secondary | ICD-10-CM | POA: Diagnosis not present

## 2017-05-04 DIAGNOSIS — I10 Essential (primary) hypertension: Secondary | ICD-10-CM | POA: Diagnosis not present

## 2017-05-04 DIAGNOSIS — N138 Other obstructive and reflux uropathy: Secondary | ICD-10-CM | POA: Diagnosis not present

## 2017-05-04 DIAGNOSIS — I951 Orthostatic hypotension: Secondary | ICD-10-CM | POA: Diagnosis not present

## 2017-05-04 DIAGNOSIS — N401 Enlarged prostate with lower urinary tract symptoms: Secondary | ICD-10-CM | POA: Diagnosis not present

## 2017-05-04 NOTE — Telephone Encounter (Signed)
Attempted to contact Amy to relay Dr. Synthia Innocent message. No answer and vm has not been set up.

## 2017-05-05 DIAGNOSIS — Z9181 History of falling: Secondary | ICD-10-CM | POA: Diagnosis not present

## 2017-05-05 DIAGNOSIS — I951 Orthostatic hypotension: Secondary | ICD-10-CM | POA: Diagnosis not present

## 2017-05-05 DIAGNOSIS — I10 Essential (primary) hypertension: Secondary | ICD-10-CM | POA: Diagnosis not present

## 2017-05-05 DIAGNOSIS — N401 Enlarged prostate with lower urinary tract symptoms: Secondary | ICD-10-CM | POA: Diagnosis not present

## 2017-05-05 DIAGNOSIS — N138 Other obstructive and reflux uropathy: Secondary | ICD-10-CM | POA: Diagnosis not present

## 2017-05-05 DIAGNOSIS — G20 Parkinson's disease: Secondary | ICD-10-CM | POA: Diagnosis not present

## 2017-05-05 NOTE — Telephone Encounter (Signed)
Noted  

## 2017-05-05 NOTE — Telephone Encounter (Signed)
Amy called back and I relayed Dr. Bosie Clos approval of requested orders.

## 2017-05-10 DIAGNOSIS — N401 Enlarged prostate with lower urinary tract symptoms: Secondary | ICD-10-CM | POA: Diagnosis not present

## 2017-05-10 DIAGNOSIS — Z9181 History of falling: Secondary | ICD-10-CM | POA: Diagnosis not present

## 2017-05-10 DIAGNOSIS — N138 Other obstructive and reflux uropathy: Secondary | ICD-10-CM | POA: Diagnosis not present

## 2017-05-10 DIAGNOSIS — G20 Parkinson's disease: Secondary | ICD-10-CM | POA: Diagnosis not present

## 2017-05-10 DIAGNOSIS — I10 Essential (primary) hypertension: Secondary | ICD-10-CM | POA: Diagnosis not present

## 2017-05-10 DIAGNOSIS — I951 Orthostatic hypotension: Secondary | ICD-10-CM | POA: Diagnosis not present

## 2017-05-11 DIAGNOSIS — N401 Enlarged prostate with lower urinary tract symptoms: Secondary | ICD-10-CM | POA: Diagnosis not present

## 2017-05-11 DIAGNOSIS — N138 Other obstructive and reflux uropathy: Secondary | ICD-10-CM | POA: Diagnosis not present

## 2017-05-11 DIAGNOSIS — Z9181 History of falling: Secondary | ICD-10-CM | POA: Diagnosis not present

## 2017-05-11 DIAGNOSIS — I951 Orthostatic hypotension: Secondary | ICD-10-CM | POA: Diagnosis not present

## 2017-05-11 DIAGNOSIS — I10 Essential (primary) hypertension: Secondary | ICD-10-CM | POA: Diagnosis not present

## 2017-05-11 DIAGNOSIS — G20 Parkinson's disease: Secondary | ICD-10-CM | POA: Diagnosis not present

## 2017-05-12 DIAGNOSIS — N138 Other obstructive and reflux uropathy: Secondary | ICD-10-CM | POA: Diagnosis not present

## 2017-05-12 DIAGNOSIS — N401 Enlarged prostate with lower urinary tract symptoms: Secondary | ICD-10-CM | POA: Diagnosis not present

## 2017-05-12 DIAGNOSIS — I951 Orthostatic hypotension: Secondary | ICD-10-CM | POA: Diagnosis not present

## 2017-05-12 DIAGNOSIS — G20 Parkinson's disease: Secondary | ICD-10-CM | POA: Diagnosis not present

## 2017-05-12 DIAGNOSIS — I10 Essential (primary) hypertension: Secondary | ICD-10-CM | POA: Diagnosis not present

## 2017-05-12 DIAGNOSIS — Z9181 History of falling: Secondary | ICD-10-CM | POA: Diagnosis not present

## 2017-05-16 ENCOUNTER — Telehealth: Payer: Self-pay | Admitting: Family Medicine

## 2017-05-16 DIAGNOSIS — N138 Other obstructive and reflux uropathy: Secondary | ICD-10-CM | POA: Diagnosis not present

## 2017-05-16 DIAGNOSIS — N401 Enlarged prostate with lower urinary tract symptoms: Secondary | ICD-10-CM | POA: Diagnosis not present

## 2017-05-16 DIAGNOSIS — G20 Parkinson's disease: Secondary | ICD-10-CM | POA: Diagnosis not present

## 2017-05-16 DIAGNOSIS — I10 Essential (primary) hypertension: Secondary | ICD-10-CM | POA: Diagnosis not present

## 2017-05-16 DIAGNOSIS — I951 Orthostatic hypotension: Secondary | ICD-10-CM | POA: Diagnosis not present

## 2017-05-16 DIAGNOSIS — Z9181 History of falling: Secondary | ICD-10-CM | POA: Diagnosis not present

## 2017-05-16 NOTE — Telephone Encounter (Signed)
Copied from [facility] #[id number]. Topic: Quick Communication - See Telephone Encounter >> May 16, 2017  3:17 PM Boyd Kerbs wrote: CRM for notification. See Telephone encounter for:  05/16/17. Needs narcotic hydrocodiactim 5.25 ml refilled

## 2017-05-17 DIAGNOSIS — I951 Orthostatic hypotension: Secondary | ICD-10-CM | POA: Diagnosis not present

## 2017-05-17 DIAGNOSIS — N401 Enlarged prostate with lower urinary tract symptoms: Secondary | ICD-10-CM | POA: Diagnosis not present

## 2017-05-17 DIAGNOSIS — G20 Parkinson's disease: Secondary | ICD-10-CM | POA: Diagnosis not present

## 2017-05-17 DIAGNOSIS — Z9181 History of falling: Secondary | ICD-10-CM | POA: Diagnosis not present

## 2017-05-17 DIAGNOSIS — N138 Other obstructive and reflux uropathy: Secondary | ICD-10-CM | POA: Diagnosis not present

## 2017-05-17 DIAGNOSIS — I10 Essential (primary) hypertension: Secondary | ICD-10-CM | POA: Diagnosis not present

## 2017-05-18 DIAGNOSIS — I951 Orthostatic hypotension: Secondary | ICD-10-CM | POA: Diagnosis not present

## 2017-05-18 DIAGNOSIS — N138 Other obstructive and reflux uropathy: Secondary | ICD-10-CM | POA: Diagnosis not present

## 2017-05-18 DIAGNOSIS — N401 Enlarged prostate with lower urinary tract symptoms: Secondary | ICD-10-CM | POA: Diagnosis not present

## 2017-05-18 DIAGNOSIS — Z9181 History of falling: Secondary | ICD-10-CM | POA: Diagnosis not present

## 2017-05-18 DIAGNOSIS — I10 Essential (primary) hypertension: Secondary | ICD-10-CM | POA: Diagnosis not present

## 2017-05-18 DIAGNOSIS — G20 Parkinson's disease: Secondary | ICD-10-CM | POA: Diagnosis not present

## 2017-05-18 MED ORDER — HYDROCODONE-ACETAMINOPHEN 5-325 MG PO TABS: 0.5000 | ORAL_TABLET | Freq: Two times a day (BID) | ORAL | 0 refills | Status: DC | PRN

## 2017-05-18 NOTE — Telephone Encounter (Signed)
Left message to call back. [Rx is ready to pick up and placed at front office.]

## 2017-05-18 NOTE — Telephone Encounter (Signed)
Printed and in CMA box 

## 2017-05-19 ENCOUNTER — Other Ambulatory Visit: Payer: Self-pay | Admitting: Neurology

## 2017-05-19 DIAGNOSIS — G20 Parkinson's disease: Secondary | ICD-10-CM | POA: Diagnosis not present

## 2017-05-19 DIAGNOSIS — I10 Essential (primary) hypertension: Secondary | ICD-10-CM | POA: Diagnosis not present

## 2017-05-19 DIAGNOSIS — Z9181 History of falling: Secondary | ICD-10-CM | POA: Diagnosis not present

## 2017-05-19 DIAGNOSIS — I951 Orthostatic hypotension: Secondary | ICD-10-CM | POA: Diagnosis not present

## 2017-05-19 DIAGNOSIS — N138 Other obstructive and reflux uropathy: Secondary | ICD-10-CM | POA: Diagnosis not present

## 2017-05-19 DIAGNOSIS — N401 Enlarged prostate with lower urinary tract symptoms: Secondary | ICD-10-CM | POA: Diagnosis not present

## 2017-05-20 ENCOUNTER — Telehealth: Payer: Self-pay

## 2017-05-20 DIAGNOSIS — N138 Other obstructive and reflux uropathy: Secondary | ICD-10-CM | POA: Diagnosis not present

## 2017-05-20 DIAGNOSIS — Z9181 History of falling: Secondary | ICD-10-CM | POA: Diagnosis not present

## 2017-05-20 DIAGNOSIS — N401 Enlarged prostate with lower urinary tract symptoms: Secondary | ICD-10-CM | POA: Diagnosis not present

## 2017-05-20 DIAGNOSIS — I10 Essential (primary) hypertension: Secondary | ICD-10-CM | POA: Diagnosis not present

## 2017-05-20 DIAGNOSIS — G20 Parkinson's disease: Secondary | ICD-10-CM | POA: Diagnosis not present

## 2017-05-20 DIAGNOSIS — I951 Orthostatic hypotension: Secondary | ICD-10-CM | POA: Diagnosis not present

## 2017-05-20 NOTE — Telephone Encounter (Signed)
Copied from [facility] #[id number]. Topic: Inquiry >> May 20, 2017  2:41 PM Bea Graff, NT wrote: Reason for CRM: Amy Moon from [facility] called to inform Dr. [name] that physical therapy missed a visit this week. Patient wasn't Feeling to well today.

## 2017-05-20 NOTE — Telephone Encounter (Signed)
pts wife just called coming now to pick up rx

## 2017-05-22 NOTE — Telephone Encounter (Signed)
Noted. Please notify us if she is not feeling better or if she continues to miss physical therapy. Thanks.

## 2017-05-23 DIAGNOSIS — N401 Enlarged prostate with lower urinary tract symptoms: Secondary | ICD-10-CM | POA: Diagnosis not present

## 2017-05-23 DIAGNOSIS — I10 Essential (primary) hypertension: Secondary | ICD-10-CM | POA: Diagnosis not present

## 2017-05-23 DIAGNOSIS — N138 Other obstructive and reflux uropathy: Secondary | ICD-10-CM | POA: Diagnosis not present

## 2017-05-23 DIAGNOSIS — Z9181 History of falling: Secondary | ICD-10-CM | POA: Diagnosis not present

## 2017-05-23 DIAGNOSIS — I951 Orthostatic hypotension: Secondary | ICD-10-CM | POA: Diagnosis not present

## 2017-05-23 DIAGNOSIS — G20 Parkinson's disease: Secondary | ICD-10-CM | POA: Diagnosis not present

## 2017-05-23 NOTE — Telephone Encounter (Signed)
Amy advised.  

## 2017-05-26 DIAGNOSIS — G20 Parkinson's disease: Secondary | ICD-10-CM | POA: Diagnosis not present

## 2017-05-26 DIAGNOSIS — N138 Other obstructive and reflux uropathy: Secondary | ICD-10-CM | POA: Diagnosis not present

## 2017-05-26 DIAGNOSIS — I10 Essential (primary) hypertension: Secondary | ICD-10-CM | POA: Diagnosis not present

## 2017-05-26 DIAGNOSIS — I951 Orthostatic hypotension: Secondary | ICD-10-CM | POA: Diagnosis not present

## 2017-05-26 DIAGNOSIS — Z9181 History of falling: Secondary | ICD-10-CM | POA: Diagnosis not present

## 2017-05-26 DIAGNOSIS — N401 Enlarged prostate with lower urinary tract symptoms: Secondary | ICD-10-CM | POA: Diagnosis not present
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
## 2017-05-31 ENCOUNTER — Encounter: Payer: Self-pay | Admitting: Neurology

## 2017-05-31 ENCOUNTER — Ambulatory Visit (INDEPENDENT_AMBULATORY_CARE_PROVIDER_SITE_OTHER): Payer: Medicare HMO | Admitting: Neurology

## 2017-05-31 VITALS — BP 102/72 | HR 82 | Ht 70.0 in | Wt 186.0 lb

## 2017-05-31 DIAGNOSIS — G20 Parkinson's disease: Secondary | ICD-10-CM | POA: Diagnosis not present

## 2017-05-31 DIAGNOSIS — I951 Orthostatic hypotension: Secondary | ICD-10-CM

## 2017-05-31 DIAGNOSIS — R55 Syncope and collapse: Secondary | ICD-10-CM

## 2017-05-31 MED ORDER — MIDODRINE HCL 5 MG PO TABS: 5.0000 mg | ORAL_TABLET | Freq: Three times a day (TID) | ORAL | 1 refills | Status: DC

## 2017-05-31 MED ORDER — CARBIDOPA-LEVODOPA 25-100 MG PO TABS: ORAL_TABLET | ORAL | 1 refills | Status: DC

## 2017-06-02 DIAGNOSIS — N138 Other obstructive and reflux uropathy: Secondary | ICD-10-CM | POA: Diagnosis not present

## 2017-06-02 DIAGNOSIS — Z9181 History of falling: Secondary | ICD-10-CM | POA: Diagnosis not present

## 2017-06-02 DIAGNOSIS — I951 Orthostatic hypotension: Secondary | ICD-10-CM | POA: Diagnosis not present

## 2017-06-02 DIAGNOSIS — I10 Essential (primary) hypertension: Secondary | ICD-10-CM | POA: Diagnosis not present

## 2017-06-02 DIAGNOSIS — G20 Parkinson's disease: Secondary | ICD-10-CM | POA: Diagnosis not present

## 2017-06-02 DIAGNOSIS — N401 Enlarged prostate with lower urinary tract symptoms: Secondary | ICD-10-CM | POA: Diagnosis not present

## 2017-07-01 ENCOUNTER — Ambulatory Visit: Payer: Medicare Other | Admitting: Neurology

## 2017-07-15 ENCOUNTER — Encounter: Payer: Self-pay | Admitting: Family Medicine

## 2017-07-15 ENCOUNTER — Ambulatory Visit (INDEPENDENT_AMBULATORY_CARE_PROVIDER_SITE_OTHER): Payer: Medicare Other | Admitting: Family Medicine

## 2017-07-15 VITALS — BP 122/80 | HR 77 | Temp 98.4°F | Wt 184.0 lb

## 2017-07-15 DIAGNOSIS — M545 Low back pain, unspecified: Secondary | ICD-10-CM

## 2017-07-15 DIAGNOSIS — G20 Parkinson's disease: Secondary | ICD-10-CM

## 2017-07-15 DIAGNOSIS — R351 Nocturia: Secondary | ICD-10-CM

## 2017-07-15 DIAGNOSIS — I951 Orthostatic hypotension: Secondary | ICD-10-CM

## 2017-07-15 DIAGNOSIS — I10 Essential (primary) hypertension: Secondary | ICD-10-CM

## 2017-07-15 DIAGNOSIS — N401 Enlarged prostate with lower urinary tract symptoms: Secondary | ICD-10-CM | POA: Diagnosis not present

## 2017-07-15 DIAGNOSIS — G8929 Other chronic pain: Secondary | ICD-10-CM | POA: Diagnosis not present

## 2017-07-15 MED ORDER — MIDODRINE HCL 5 MG PO TABS: 5.0000 mg | ORAL_TABLET | Freq: Three times a day (TID) | ORAL | 1 refills | Status: DC

## 2017-07-15 MED ORDER — FINASTERIDE 5 MG PO TABS: 5.0000 mg | ORAL_TABLET | Freq: Every day | ORAL | 1 refills | Status: DC

## 2017-07-15 MED ORDER — HYDROCODONE-ACETAMINOPHEN 5-325 MG PO TABS: 0.5000 | ORAL_TABLET | Freq: Two times a day (BID) | ORAL | 0 refills | Status: DC | PRN

## 2017-07-15 NOTE — Assessment & Plan Note (Addendum)
[place] CSRS reviewed. Hydrocodone refilled.  Will need UDS and controlled substance agreement filled out.

## 2017-07-15 NOTE — Assessment & Plan Note (Addendum)
Appreciate neuro care. Rolling walker with seat Rx provided today.

## 2017-07-15 NOTE — Assessment & Plan Note (Signed)
Patient takes midodrine 5mg  with breakfast and lunch, then 2.5mg  at dinner. I accidentally refilled this today although previous refills had come from Dr Tat. Discussed with wife - future refills likely to come from Dr Tat.

## 2017-07-15 NOTE — Patient Instructions (Addendum)
Medicines refilled today. Try to limit liquids after 7 pm.  Prescription for rolling walker with seat provided today.  Good to see you today, call us with questions. Return in 4 months for medicare wellness visit with me or Katha Cabal our wellness nurse.

## 2017-07-15 NOTE — Progress Notes (Signed)
BP 122/80 (BP Location: Left Arm, Patient Position: Sitting, Cuff Size: Normal)   Pulse 77   Temp 98.4 F (36.9 C) (Oral)   Wt 184 lb (83.5 kg)   SpO2 99%   BMI 26.40 kg/m    CC: 5 mo f/u visit  Subjective:    Patient ID: Brandon Manning, male    DOB: 04/10/1946, 71 y.o.   MRN: [id number]  HPI: Brandon Manning is a 71 y.o. male presenting on 07/15/2017 for 5 mo follow-up (Requests rx for walker with a seat) and Nocturia (Gets up many times a night to urinate)   Permissive hypertension - on midodrine 5mg  TID with meals (night time dose is 1/2 tablet).   Waking up at night with nocturia x 4-5. This is despite finasteride. Avoiding flomax type medications in h/o orthostasis.  Requests Rx for rolling walker.   Pt is concerned about R leg swelling and stiffness.   Relevant past medical, surgical, family and social history reviewed and updated as indicated. Interim medical history since our last visit reviewed. Allergies and medications reviewed and updated. Outpatient Medications Prior to Visit  Medication Sig Dispense Refill  . aspirin EC 81 MG tablet Take 81 mg by mouth every other day.    . carbidopa-levodopa (SINEMET IR) 25-100 MG tablet TAKE 2 TABLETS BY MOUTH AT 7AM, 1 TABLET AT 12 NOON AND 1 TABLET AT 5PM 360 tablet 1  . Multiple Vitamins-Minerals (MULTIVITAMIN PO) Take 1 tablet by mouth daily.    . polyethylene glycol powder (MIRALAX) powder Take 255 g by mouth daily as needed.    . finasteride (PROSCAR) 5 MG tablet Take 1 tablet (5 mg total) by mouth daily. 90 tablet 1  . HYDROcodone-acetaminophen (NORCO/VICODIN) 5-325 MG tablet Take 0.5 tablets by mouth 2 (two) times daily as needed for moderate pain. 30 tablet 0  . midodrine (PROAMATINE) 5 MG tablet Take 1 tablet (5 mg total) 3 (three) times daily with meals by mouth. 1 in the AM and noon, 1/2 tablet at bed 225 tablet 1   No facility-administered medications prior to visit.      Per HPI unless specifically indicated  in ROS section below Review of Systems     Objective:    BP 122/80 (BP Location: Left Arm, Patient Position: Sitting, Cuff Size: Normal)   Pulse 77   Temp 98.4 F (36.9 C) (Oral)   Wt 184 lb (83.5 kg)   SpO2 99%   BMI 26.40 kg/m   Wt Readings from Last 3 Encounters:  07/15/17 184 lb (83.5 kg)  05/31/17 186 lb (84.4 kg)  04/28/17 180 lb (81.6 kg)    Physical Exam  Constitutional: He appears well-developed and well-nourished. No distress.  HENT:  Head: Normocephalic and atraumatic.  Mouth/Throat: Oropharynx is clear and moist. No oropharyngeal exudate.  Cardiovascular: Normal rate, regular rhythm, normal heart sounds and intact distal pulses.  No murmur heard. Pulmonary/Chest: Effort normal and breath sounds normal. No respiratory distress. He has no wheezes. He has no rales.  Musculoskeletal: He exhibits no edema.  No significant pedal edema FROM at bilateral knees  Skin: Skin is warm and dry. No rash noted.  Psychiatric: He has a normal mood and affect.  Nursing note and vitals reviewed.      Assessment & Plan:   Problem List Items Addressed This Visit    Bilateral low back pain without sciatica   Relevant Medications   HYDROcodone-acetaminophen (NORCO/VICODIN) 5-325 MG tablet   BPH (benign prostatic hyperplasia)  Chronic. Continue finasteride. Now off flomax due to orthostatic hypotension. Discussed avoiding drinking liquid past 7pm.       Relevant Medications   finasteride (PROSCAR) 5 MG tablet   Encounter for chronic pain management    [place] CSRS reviewed. Hydrocodone refilled.  Will need UDS and controlled substance agreement filled out.       HTN (hypertension)    Permissive hypertension in PD and h/o orthostasis      Relevant Medications   midodrine (PROAMATINE) 5 MG tablet   Orthostatic hypotension    Patient takes midodrine 5mg  with breakfast and lunch, then 2.5mg  at dinner. I accidentally refilled this today although previous refills had come from  Dr Tat. Discussed with wife - future refills likely to come from Dr Tat.       Relevant Medications   midodrine (PROAMATINE) 5 MG tablet   Parkinson disease ([place]) - Primary    Appreciate neuro care. Rolling walker with seat Rx provided today.          Follow up plan: Return in about 6 months (around 01/13/2018) for medicare wellness visit.  [name], MD

## 2017-07-15 NOTE — Assessment & Plan Note (Addendum)
Chronic. Continue finasteride. Now off flomax due to orthostatic hypotension. Discussed avoiding drinking liquid past 7pm.

## 2017-07-15 NOTE — Assessment & Plan Note (Signed)
Permissive hypertension in PD and h/o orthostasis

## 2017-07-22 ENCOUNTER — Ambulatory Visit: Payer: Medicare Other | Admitting: Family Medicine

## 2017-08-25 ENCOUNTER — Other Ambulatory Visit: Payer: Self-pay | Admitting: Family Medicine
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
## 2017-09-29 ENCOUNTER — Ambulatory Visit (INDEPENDENT_AMBULATORY_CARE_PROVIDER_SITE_OTHER): Payer: Medicare HMO | Admitting: Neurology

## 2017-09-29 ENCOUNTER — Other Ambulatory Visit: Payer: Self-pay | Admitting: Family Medicine

## 2017-09-29 ENCOUNTER — Encounter: Payer: Self-pay | Admitting: Neurology

## 2017-09-29 VITALS — BP 120/88 | HR 72 | Ht 70.0 in | Wt 183.0 lb

## 2017-09-29 DIAGNOSIS — I951 Orthostatic hypotension: Secondary | ICD-10-CM | POA: Diagnosis not present

## 2017-09-29 DIAGNOSIS — G20 Parkinson's disease: Secondary | ICD-10-CM

## 2017-09-29 DIAGNOSIS — G8929 Other chronic pain: Secondary | ICD-10-CM

## 2017-09-29 NOTE — Patient Instructions (Signed)
  [facility] Parkinson's [facility] Movement [facility]  The [facility] team know that living well with a movement disorder extends far beyond our clinic walls. We are together with you. Our team is passionate about providing resources to you and your loved ones who are living with Parkinson's disease and movement disorders. Participate in these programs and join our community. These resources are free or low cost!   [facility] is adding:   Innovative educational programs for patients and caregivers.   Support groups for patients and caregivers living with Parkinson's disease.   Parkinson's specific exercise programs.   Custom tailored therapeutic programs that will benefit patient's living with Parkinson's disease.   We are in this together. You can help and contribute to grow these programs and resources in our community. 100% of the funds donated to the Movement Disorders Fund stays right here in our community to support patients and their caregivers.  To make a tax deductible contribution:  -ask for a [facility] Parkinson's envelope in the office today.  - call the Office of Institutional Advancement at [phone].         

## 2017-09-29 NOTE — Telephone Encounter (Signed)
Copied from [facility] [phone] [id number]. Topic: Quick Communication - Rx Refill/Question >> Sep 29, 2017 11:43 AM Neva Seat wrote:  HYDROcodone-acetaminophen (NORCO/VICODIN) 5-325 MG tablet  Pt needing refill.  Please call wife to come by office to pick up the written Rx.  [facility] [address] ([address]), [place] [address] [place] [address] Phone: [phone] Fax: [phone]

## 2017-09-29 NOTE — Telephone Encounter (Signed)
Last OV: 07/15/17 PCP: [place]: [facility] [address] ([address]), [place] [phone] (Phone) [phone] (Fax)

## 2017-10-01 MED ORDER — HYDROCODONE-ACETAMINOPHEN 5-325 MG PO TABS: 0.5000 | ORAL_TABLET | Freq: Two times a day (BID) | ORAL | 0 refills | Status: DC | PRN

## 2017-10-01 NOTE — Telephone Encounter (Signed)
Last filled 07/15/2018. Will be due for OV 12/13/2017 Receives hydrocodone #30 q3 mo.

## 2017-11-21 ENCOUNTER — Other Ambulatory Visit: Payer: Self-pay | Admitting: Neurology

## 2017-11-21 MED ORDER — MIDODRINE HCL 5 MG PO TABS: ORAL_TABLET | ORAL | 1 refills | Status: DC

## 2017-11-21 MED ORDER — MIDODRINE HCL 5 MG PO TABS: 5.0000 mg | ORAL_TABLET | Freq: Three times a day (TID) | ORAL | 1 refills | Status: DC

## 2017-11-21 NOTE — Telephone Encounter (Signed)
Med sent with two directions earlier. Medication resent for clarification.

## 2017-12-13 ENCOUNTER — Encounter: Payer: Self-pay | Admitting: Family Medicine

## 2017-12-13 ENCOUNTER — Ambulatory Visit (INDEPENDENT_AMBULATORY_CARE_PROVIDER_SITE_OTHER): Payer: Medicare HMO | Admitting: Family Medicine

## 2017-12-13 VITALS — BP 134/82 | HR 71 | Temp 98.6°F | Ht 70.0 in | Wt 181.5 lb

## 2017-12-13 DIAGNOSIS — M545 Low back pain: Secondary | ICD-10-CM

## 2017-12-13 DIAGNOSIS — N401 Enlarged prostate with lower urinary tract symptoms: Secondary | ICD-10-CM

## 2017-12-13 DIAGNOSIS — R351 Nocturia: Secondary | ICD-10-CM | POA: Diagnosis not present

## 2017-12-13 DIAGNOSIS — G8929 Other chronic pain: Secondary | ICD-10-CM

## 2017-12-13 DIAGNOSIS — G20 Parkinson's disease: Secondary | ICD-10-CM | POA: Diagnosis not present

## 2017-12-13 MED ORDER — HYDROCODONE-ACETAMINOPHEN 5-325 MG PO TABS: 0.5000 | ORAL_TABLET | Freq: Two times a day (BID) | ORAL | 0 refills | Status: DC | PRN

## 2017-12-13 MED ORDER — FINASTERIDE 5 MG PO TABS: 5.0000 mg | ORAL_TABLET | Freq: Every day | ORAL | 3 refills | Status: DC

## 2017-12-13 NOTE — Assessment & Plan Note (Addendum)
Wife endorses stable period. Finasteride refilled today. Off flomax due to h/o orthostasis

## 2017-12-13 NOTE — Assessment & Plan Note (Signed)
Stable period. Encouraged they start with plain tylenol, save hydrocodone for breakthrough pain. Reviewed detrimental secondary effects of narcotics.

## 2017-12-13 NOTE — Assessment & Plan Note (Addendum)
[place] CSRS reviewed and appropriate. Hydrocodone refilled. #30 tablets lasts ~2 months. Encouraged ongoing sparing use.  Needs UDS and controlled substance agreement filled out - will do today.

## 2017-12-13 NOTE — Patient Instructions (Addendum)
Finasteride sent to [facility].  Hydrocodone pain medicine sent in to local [facility].  Start with plain tylenol for back pain, and if persistent, then take 1/2 hydrocodone.  Urine screen and controlled substance agreement form today.

## 2017-12-13 NOTE — Assessment & Plan Note (Signed)
Appreciate neuro care. Has rollator at home, but comes in today without using assistive ambulatory device

## 2017-12-13 NOTE — Progress Notes (Signed)
BP 134/82 (BP Location: Left Arm, Patient Position: Sitting, Cuff Size: Normal)   Pulse 71   Temp 98.6 F (37 C) (Oral)   Ht 5\' 10"  (1.778 m)   Wt 181 lb 8 oz (82.3 kg)   SpO2 98%   BMI 26.04 kg/m    CC: med refill visit Subjective:    Patient ID: Brandon Manning, male    DOB: 1945-07-23, 72 y.o.   MRN: [id number]  HPI: Brandon Manning is a 72 y.o. male presenting on 12/13/2017 for Medication Refill (Pt requests 90-day refills for meds sent to [facility] and hydrocodone to [facility]. Pt accompained by wife. )   Upcoming appt Monday with Dermatology for facial cyst.  PD - followed by Dr Tat.   BPH - nocturia x2. Takes finasteride daily.   Chronic back pain - on hydrocodone for breakthrough pain. No constipation - takes prune juice for this. No h/o falls. Has walker at home, but not using regularly. Tolerating medications well.   Relevant past medical, surgical, family and social history reviewed and updated as indicated. Interim medical history since our last visit reviewed. Allergies and medications reviewed and updated. Outpatient Medications Prior to Visit  Medication Sig Dispense Refill  . aspirin EC 81 MG tablet Take 81 mg by mouth every other day.    . carbidopa-levodopa (SINEMET IR) 25-100 MG tablet TAKE 2 TABLETS BY MOUTH AT 7AM, 1 TABLET AT 12 NOON AND 1 TABLET AT 5PM 360 tablet 1  . midodrine (PROAMATINE) 5 MG tablet 1 in the AM and noon, 1/2 tablet at bed 225 tablet 1  . Multiple Vitamins-Minerals (MULTIVITAMIN PO) Take 1 tablet by mouth daily.    . polyethylene glycol powder (MIRALAX) powder Take 255 g by mouth daily as needed.    . finasteride (PROSCAR) 5 MG tablet Take 1 tablet (5 mg total) by mouth daily. 90 tablet 1  . HYDROcodone-acetaminophen (NORCO/VICODIN) 5-325 MG tablet Take 0.5 tablets by mouth 2 (two) times daily as needed for moderate pain. 30 tablet 0   No facility-administered medications prior to visit.      Per HPI unless  specifically indicated in ROS section below Review of Systems     Objective:    BP 134/82 (BP Location: Left Arm, Patient Position: Sitting, Cuff Size: Normal)   Pulse 71   Temp 98.6 F (37 C) (Oral)   Ht 5\' 10"  (1.778 m)   Wt 181 lb 8 oz (82.3 kg)   SpO2 98%   BMI 26.04 kg/m   Wt Readings from Last 3 Encounters:  12/13/17 181 lb 8 oz (82.3 kg)  09/29/17 183 lb (83 kg)  07/15/17 184 lb (83.5 kg)    Physical Exam  Constitutional: He appears well-developed and well-nourished. No distress.  HENT:  Mouth/Throat: Oropharynx is clear and moist. No oropharyngeal exudate.  Cardiovascular: Normal rate, regular rhythm and normal heart sounds.  No murmur heard. Pulmonary/Chest: Effort normal and breath sounds normal. No respiratory distress. He has no wheezes. He has no rales.  Musculoskeletal: He exhibits no edema.  Neurological:  Masked fascies Shuffling gait Does not use walker or cane Gets on exam table alone, but slowed  Skin: Skin is warm and dry.  Nursing note and vitals reviewed.     Assessment & Plan:  Advised to contact neurology to fill carbidopa/levodopa Problem List Items Addressed This Visit    Bilateral low back pain without sciatica    Stable period. Encouraged they start with plain tylenol, save  hydrocodone for breakthrough pain. Reviewed detrimental secondary effects of narcotics.       Relevant Medications   HYDROcodone-acetaminophen (NORCO/VICODIN) 5-325 MG tablet   BPH (benign prostatic hyperplasia)    Wife endorses stable period. Finasteride refilled today. Off flomax due to h/o orthostasis      Relevant Medications   finasteride (PROSCAR) 5 MG tablet   Encounter for chronic pain management - Primary    [place] CSRS reviewed and appropriate. Hydrocodone refilled. #30 tablets lasts ~2 months. Encouraged ongoing sparing use.  Needs UDS and controlled substance agreement filled out - will do today.       Relevant Orders   Pain Mgmt, Profile 8 w/Conf, U    Parkinson disease ([place])    Appreciate neuro care. Has rollator at home, but comes in today without using assistive ambulatory device       Other Visit Diagnoses    PD (Parkinson's disease) ([place])           Meds ordered this encounter  Medications  . finasteride (PROSCAR) 5 MG tablet    Sig: Take 1 tablet (5 mg total) by mouth daily.    Dispense:  90 tablet    Refill:  3  . HYDROcodone-acetaminophen (NORCO/VICODIN) 5-325 MG tablet    Sig: Take 0.5 tablets by mouth 2 (two) times daily as needed for moderate pain.    Dispense:  30 tablet    Refill:  0   Orders Placed This Encounter  Procedures  . Pain Mgmt, Profile 8 w/Conf, U    Order Specific Question:   Prescribed drugs 1:    Answer:   HYDROCODONE    Follow up plan: Return in about 3 months (around 03/15/2018) for annual exam, prior fasting for blood work, medicare wellness visit.  [name], MD

## 2017-12-15 ENCOUNTER — Telehealth: Payer: Self-pay | Admitting: Family Medicine

## 2017-12-15 MED ORDER — HYDROCODONE-ACETAMINOPHEN 5-325 MG PO TABS: 0.5000 | ORAL_TABLET | Freq: Two times a day (BID) | ORAL | 0 refills | Status: DC | PRN

## 2017-12-15 NOTE — Telephone Encounter (Signed)
No plz call and cancel. New med sent to local pharmacy.

## 2017-12-15 NOTE — Telephone Encounter (Signed)
Spoke with Judson Roch, pharmacist with [facility].  Requested the hydrocodone rx be canceled.  Judson Roch confirmed rx will be canceled.

## 2017-12-15 NOTE — Telephone Encounter (Signed)
Dr. Darnell Level, did you mean to send this rx to [facility] mail order?

## 2017-12-15 NOTE — Telephone Encounter (Signed)
Copied from [place] [id number]. Topic: General - Other >> Dec 15, 2017 12:14 PM Lennox Solders wrote: Reason for CRM: jennifer from [facility] mail order pharm is questioning the hydrocodone rx

## 2017-12-16 LAB — PAIN MGMT, PROFILE 8 W/CONF, U
6 Acetylmorphine: NEGATIVE ng/mL (ref <10)
ALPHAHYDROXYALPRAZOLAM: NEGATIVE ng/mL (ref <25)
Alcohol Metabolites: NEGATIVE ng/mL (ref <500)
Alphahydroxymidazolam: NEGATIVE ng/mL (ref <50)
Alphahydroxytriazolam: NEGATIVE ng/mL (ref <50)
Aminoclonazepam: NEGATIVE ng/mL (ref <25)
Amphetamines: NEGATIVE ng/mL (ref <500)
BUPRENORPHINE, URINE: NEGATIVE ng/mL (ref <5)
Benzodiazepines: NEGATIVE ng/mL (ref <100)
Cocaine Metabolite: NEGATIVE ng/mL (ref <150)
HYDROXYETHYLFLURAZEPAM: NEGATIVE ng/mL (ref <50)
LORAZEPAM: NEGATIVE ng/mL (ref <50)
MARIJUANA METABOLITE: NEGATIVE ng/mL (ref <20)
MDMA: NEGATIVE ng/mL (ref <500)
Nordiazepam: NEGATIVE ng/mL (ref <50)
OXIDANT: NEGATIVE ug/mL (ref <200)
Opiates: NEGATIVE ng/mL (ref <100)
Oxazepam: NEGATIVE ng/mL (ref <50)
Oxycodone: NEGATIVE ng/mL (ref <100)
PH: 6.82 (ref 4.5–9.0)
Temazepam: NEGATIVE ng/mL (ref <50)

## 2017-12-19 DIAGNOSIS — L720 Epidermal cyst: Secondary | ICD-10-CM | POA: Diagnosis not present

## 2018-01-09 DIAGNOSIS — L720 Epidermal cyst: Secondary | ICD-10-CM | POA: Diagnosis not present

## 2018-01-09 DIAGNOSIS — D485 Neoplasm of uncertain behavior of skin: Secondary | ICD-10-CM | POA: Diagnosis not present

## 2018-02-14 ENCOUNTER — Other Ambulatory Visit: Payer: Self-pay | Admitting: Neurology

## 2018-02-14 DIAGNOSIS — G20 Parkinson's disease: Secondary | ICD-10-CM

## 2018-02-15 ENCOUNTER — Telehealth: Payer: Self-pay | Admitting: Neurology

## 2018-02-15 NOTE — Telephone Encounter (Signed)
This was sent to [facility] yesterday when requested.

## 2018-02-15 NOTE — Telephone Encounter (Signed)
Pt's wife called stating that Brandon Manning will take 4 weeks to deliver pt's medication for parkinson and he only has 4 pills left. She is requesting a rf sent to [place] in [place] so he can have supply until he gets shipment from [place].

## 2018-02-23 ENCOUNTER — Other Ambulatory Visit: Payer: Self-pay | Admitting: Neurology

## 2018-02-23 DIAGNOSIS — G20 Parkinson's disease: Secondary | ICD-10-CM

## 2018-02-23 MED ORDER — CARBIDOPA-LEVODOPA 25-100 MG PO TABS: ORAL_TABLET | ORAL | 0 refills | Status: DC
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
## 2018-03-02 ENCOUNTER — Encounter: Payer: Self-pay | Admitting: Neurology

## 2018-03-02 ENCOUNTER — Ambulatory Visit (INDEPENDENT_AMBULATORY_CARE_PROVIDER_SITE_OTHER): Payer: Medicare HMO | Admitting: Neurology

## 2018-03-02 VITALS — BP 144/82 | HR 88 | Ht 70.5 in | Wt 177.0 lb

## 2018-03-02 DIAGNOSIS — G20 Parkinson's disease: Secondary | ICD-10-CM

## 2018-03-02 DIAGNOSIS — I951 Orthostatic hypotension: Secondary | ICD-10-CM | POA: Diagnosis not present

## 2018-03-02 NOTE — Patient Instructions (Signed)
 Community Parkinson's Exercise Programs   Parkinson's Wellness Recovery Exercise Programs:   PWR! Moves PD Exercise Class:  This is a therapist-led exercise class for people with Parkinson's disease in the [place] community. It consists of a one-hour exercise class each week. Classes are offered in eight-week sessions, and the cost per session is $80. Class size is limited to a maximum of 20 participants. Participant criteria includes: Participant must be able to get up and down from the floor with minimal to no assistance, have had 0-1 falls in the past 6 months, and have completed physical or occupational therapy at [facility] within the past year.  To find out more about session dates, questions, or to register, please contact Amy Marriott, Physical Therapist, or Denise Robertson, Physical Therapist Assistant, at [facility] at [phone].  PWR! Circuit Class:  This is a therapist-led exercise class with intervals of circuit activities incorporating PWR! Moves into functional activities. It consists of one 45-minute exercise class per week. Classes are offered in eight-week sessions, and the cost per session is $120. Class size is limited to a maximum of eight participants to allow for hands-on instruction. Participant criteria: class is ideal for people with Parkinson's disease who have completed PWR! Moves Exercise Class or who are currently independently exercising and want to be challenged, must be able to walk independently with 0-1 falls in the past 6 months, able to get up and down from the floor independently, able to sit to stand independently, and able to jog 20 feet.   To find out more about session dates, questions, or to register, please contact Amy Marriott, Physical Therapist, or Denise Robertson, Physical Therapist Assistant, at [facility] at [phone].   [employer] Parkinson's Cycle:    Parkinson's Cycle Class at [facility] This is an ongoing class on Monday and Thursday mornings at 10:45 a.m. A healthcare provider referral is required to enroll. This class is FREE to participants, and you do not have to be a member of the [facility] to enroll. Contact Beth at [phone] or [email or web address]. Parkinson's Cycle Class at [facility] Ongoing Class Monday, Wednesday, and Friday mornings at 9:00 a.m. A healthcare provider referral is required to enroll. This class is FREE to participants, and you do not have to be a member of the [facility] to enroll. Contact Marlee at [phone] or [email or web address]. Parkinson's Cycle Class at [facility] Ongoing Class every Friday mornings at 12 p.m.  A healthcare provider referral is required to enroll. This class is FREE to participants, and you do not have to be a member of the [facility] to enroll. Contact [phone].  Parkinson's Cycle Class at [facility] Ongoing Class every Monday at 12pm.  A healthcare provider referral is required to enroll. This class is FREE to participants, and you do not have to be a member of the [facility] to enroll. Contact Julie at [phone] or  [email or web address].   Rock Ste[employer] Boxing:  [employer] [place]  Classes are offered Mondays at 5:15 p.m. and Tuesdays and Thursdays at 12 p.m. at [facility] [employer] [facility]. For more information, contact [phone] or visit [email or web address] or [email or web address]. [employer] [place] Classes are offered Monday, Wednesday, and Friday from 9:30 a.m. - 11:00 a.m. For more information, contact [phone] or [phone] or email [email or web address] or visit [email or web address] or [email or web address]/. [employer] [place] (classes are offered at 2 locations) . [employer] in [place] (for more information,   contact Thad Stovall at  [phone] or email [email or web address] . [place] [facility] (class is open to the public -- for more information, contact Michael Cain at [phone] or email [email or web address]) [employer] [place] Classes are held at [facility] in [place], [place]. For more information, call Dr. Laura Beck at [phone] or [email or web address].   Personal Training for Parkinson's:   [employer] Offers certified personal training to customize a program to meet your exercise needs to address Parkinson's disease. For more information, contact [phone] or visit [email or web address].  Community Dance for Parkinson's:   Community dance class for people with Parkinson's Disease Wednesdays at 9 a.m. [employer] [address] [place], [place] [address] Please contact Christina Soriano [phone] for more information  Scholarships Available for Fitness Programs:  The [employer] for Parkinston's is a non-profit 501(C)3 organization run by volunteers, whose mission is to strive to empower those living with Parkinson's Disease (PD), Progressive Supra-Nuclear Palsy (PSP) and Multiple System Atrophy (MSA).  Through financial support, recipients benefit from individual and group programs. [phone] [email or web address]  

## 2018-03-15 ENCOUNTER — Encounter: Payer: Self-pay | Admitting: Family Medicine

## 2018-03-15 ENCOUNTER — Ambulatory Visit (INDEPENDENT_AMBULATORY_CARE_PROVIDER_SITE_OTHER): Payer: Medicare HMO | Admitting: Family Medicine

## 2018-03-15 VITALS — BP 132/84 | HR 70 | Temp 98.3°F | Ht 70.0 in | Wt 181.0 lb

## 2018-03-15 DIAGNOSIS — M545 Low back pain, unspecified: Secondary | ICD-10-CM

## 2018-03-15 DIAGNOSIS — G20 Parkinson's disease: Secondary | ICD-10-CM

## 2018-03-15 DIAGNOSIS — N401 Enlarged prostate with lower urinary tract symptoms: Secondary | ICD-10-CM

## 2018-03-15 DIAGNOSIS — R351 Nocturia: Secondary | ICD-10-CM | POA: Diagnosis not present

## 2018-03-15 DIAGNOSIS — G8929 Other chronic pain: Secondary | ICD-10-CM | POA: Diagnosis not present

## 2018-03-15 DIAGNOSIS — R319 Hematuria, unspecified: Secondary | ICD-10-CM | POA: Diagnosis not present

## 2018-03-15 DIAGNOSIS — N3941 Urge incontinence: Secondary | ICD-10-CM

## 2018-03-15 LAB — POC URINALSYSI DIPSTICK (AUTOMATED)
GLUCOSE UA: NEGATIVE
LEUKOCYTES UA: NEGATIVE
NITRITE UA: NEGATIVE
PH UA: 6 (ref 5.0–8.0)
Protein, UA: POSITIVE — AB
Spec Grav, UA: >=1.03 — AB (ref 1.010–1.025)
UROBILINOGEN UA: 2 U/dL — AB

## 2018-03-15 MED ORDER — MIRABEGRON ER 25 MG PO TB24: 25.0000 mg | ORAL_TABLET | Freq: Every day | ORAL | 1 refills | Status: DC

## 2018-03-15 MED ORDER — HYDROCODONE-ACETAMINOPHEN 5-325 MG PO TABS: 0.5000 | ORAL_TABLET | Freq: Two times a day (BID) | ORAL | 0 refills | Status: DC | PRN

## 2018-03-15 NOTE — Assessment & Plan Note (Signed)
[place] CSRS reviewed and appropriate.  Tolerating med well. Use sparingly.

## 2018-03-15 NOTE — Patient Instructions (Addendum)
Schedule wellness visit ideally for October. You are doing well today.  Urinalysis today. We will call you with results.  In the meantime, try myrbetriq medicine to help with urinary urgency.

## 2018-03-15 NOTE — Progress Notes (Signed)
BP 132/84 (BP Location: Right Arm, Patient Position: Sitting, Cuff Size: Normal)   Pulse 70   Temp 98.3 F (36.8 C) (Oral)   Ht 5\' 10"  (1.778 m)   Wt 181 lb (82.1 kg)   SpO2 99%   BMI 25.97 kg/m    CC: 3 mo pain management visit Subjective:    Patient ID: Brandon Manning, male    DOB: 1946-03-17, 72 y.o.   MRN: [id number]  HPI: Brandon Manning is a 72 y.o. male presenting on 03/15/2018 for Pain Management (Here for 3 mo f/u. Wants to discuss rx for nocturia. Pt accompanied by his wife.)   Overdue for AMW.  PD - sees Dr Tat. Stable period.   Chronic back pain - on hydrocodone for breakthrough pain. #30 last filled 11/2017.   BPH with nocturia x3-4 times, small amounts at a time. He is taking finasteride daily. Limits drinking past 7pm. Some rare daytime urgency and accidents - some leaking when getting to the bathroom. Denies dysuria. No stress incontinence symptoms. Uses depends intermittently, has pad on bed.   Relevant past medical, surgical, family and social history reviewed and updated as indicated. Interim medical history since our last visit reviewed. Allergies and medications reviewed and updated. Outpatient Medications Prior to Visit  Medication Sig Dispense Refill  . aspirin EC 81 MG tablet Take 81 mg by mouth every other day.    . carbidopa-levodopa (SINEMET IR) 25-100 MG tablet TAKE 2 TABLETS BY MOUTH AT 7AM, 1 TABLET AT 12 NOON AND 1 TABLET AT 5PM 360 tablet 0  . finasteride (PROSCAR) 5 MG tablet Take 1 tablet (5 mg total) by mouth daily. 90 tablet 3  . midodrine (PROAMATINE) 5 MG tablet 1 in the AM and noon, 1/2 tablet at bed 225 tablet 1  . Multiple Vitamins-Minerals (MULTIVITAMIN PO) Take 1 tablet by mouth daily.    . polyethylene glycol powder (MIRALAX) powder Take 255 g by mouth daily as needed.    Marland Kitchen HYDROcodone-acetaminophen (NORCO/VICODIN) 5-325 MG tablet Take 0.5 tablets by mouth 2 (two) times daily as needed for moderate pain. 30 tablet 0   No  facility-administered medications prior to visit.      Per HPI unless specifically indicated in ROS section below Review of Systems     Objective:    BP 132/84 (BP Location: Right Arm, Patient Position: Sitting, Cuff Size: Normal)   Pulse 70   Temp 98.3 F (36.8 C) (Oral)   Ht 5\' 10"  (1.778 m)   Wt 181 lb (82.1 kg)   SpO2 99%   BMI 25.97 kg/m   Wt Readings from Last 3 Encounters:  03/15/18 181 lb (82.1 kg)  03/02/18 177 lb (80.3 kg)  12/13/17 181 lb 8 oz (82.3 kg)    Physical Exam  Constitutional: He appears well-developed and well-nourished. No distress.  Cardiovascular: Normal rate, regular rhythm and normal heart sounds.  No murmur heard. Pulmonary/Chest: Effort normal and breath sounds normal. No respiratory distress. He has no wheezes. He has no rales.  Musculoskeletal: He exhibits edema (tr).  Neurological:  Masked fascies  Nursing note and vitals reviewed.  Results for orders placed or performed in visit on 03/15/18  POCT Urinalysis Dipstick (Automated)  Result Value Ref Range   Color, UA gold    Clarity, UA clear    Glucose, UA Negative Negative   Bilirubin, UA 1+    Ketones, UA +/-    Spec Grav, UA >=1.030 (A) 1.010 - 1.025   Blood,  UA 1+    pH, UA 6.0 5.0 - 8.0   Protein, UA Positive (A) Negative   Urobilinogen, UA 2.0 (A) 0.2 or 1.0 E.U./dL   Nitrite, UA negative    Leukocytes, UA Negative Negative      Assessment & Plan:   Problem List Items Addressed This Visit    Urge incontinence    Predominant nocturia, however also endorses some daytime urge incontinence. He wears depends intermittently and uses pad at night. No stress incontinence symptoms. Check UA today, trial myrbetriq for overactive bladder.       Relevant Medications   mirabegron ER (MYRBETRIQ) 25 MG TB24 tablet   Other Relevant Orders   POCT Urinalysis Dipstick (Automated) (Completed)   Parkinson disease ([place])    Stable period followed by neurology, appreciate their care.        Hematuria    Found on UA today - will discuss with patient and wife, recommend urology referral. No known h/o this. Non smoker.       Relevant Orders   POCT Urinalysis Dipstick (Automated) (Completed)   Urine Culture   Encounter for chronic pain management - Primary    [place] CSRS reviewed and appropriate.  Tolerating med well. Use sparingly.       BPH (benign prostatic hyperplasia)    Longstanding h/o this, on finasteride. Avoid flomax due to orthostasis, avoid anticholinergics in parkinson's. Wife endorses trouble with nocturia as well as some daytime accidents. See below       Bilateral low back pain without sciatica   Relevant Medications   HYDROcodone-acetaminophen (NORCO/VICODIN) 5-325 MG tablet       Meds ordered this encounter  Medications  . mirabegron ER (MYRBETRIQ) 25 MG TB24 tablet    Sig: Take 1 tablet (25 mg total) by mouth daily.    Dispense:  30 tablet    Refill:  1  . HYDROcodone-acetaminophen (NORCO/VICODIN) 5-325 MG tablet    Sig: Take 0.5 tablets by mouth 2 (two) times daily as needed for moderate pain.    Dispense:  30 tablet    Refill:  0   Orders Placed This Encounter  Procedures  . Urine Culture  . POCT Urinalysis Dipstick (Automated)    Follow up plan: No follow-ups on file.  [name], MD

## 2018-03-16 DIAGNOSIS — R319 Hematuria, unspecified: Secondary | ICD-10-CM | POA: Insufficient documentation

## 2018-03-16 DIAGNOSIS — N3941 Urge incontinence: Secondary | ICD-10-CM | POA: Insufficient documentation

## 2018-03-16 NOTE — Assessment & Plan Note (Addendum)
Longstanding h/o this, on finasteride. Avoid flomax due to orthostasis, avoid anticholinergics in parkinson's. Wife endorses trouble with nocturia as well as some daytime accidents. See below

## 2018-03-16 NOTE — Assessment & Plan Note (Signed)
Stable period followed by neurology, appreciate their care.

## 2018-03-16 NOTE — Assessment & Plan Note (Addendum)
Found on UA today - will discuss with patient and wife, recommend urology referral. No known h/o this. Non smoker.

## 2018-03-16 NOTE — Assessment & Plan Note (Signed)
Predominant nocturia, however also endorses some daytime urge incontinence. He wears depends intermittently and uses pad at night. No stress incontinence symptoms. Check UA today, trial myrbetriq for overactive bladder.

## 2018-03-17 LAB — URINE CULTURE
MICRO NUMBER: 91035844
Result:: NO GROWTH
SPECIMEN QUALITY: ADEQUATE

## 2018-03-18 ENCOUNTER — Other Ambulatory Visit: Payer: Self-pay | Admitting: Family Medicine

## 2018-03-18 DIAGNOSIS — N401 Enlarged prostate with lower urinary tract symptoms: Secondary | ICD-10-CM

## 2018-03-18 DIAGNOSIS — R319 Hematuria, unspecified: Secondary | ICD-10-CM

## 2018-03-18 DIAGNOSIS — R351 Nocturia: Secondary | ICD-10-CM

## 2018-03-21 ENCOUNTER — Telehealth: Payer: Self-pay

## 2018-03-21 NOTE — Telephone Encounter (Signed)
Copied from [place] [id number]. Topic: Inquiry >> Mar 21, 2018  9:25 AM Vernona Rieger wrote: Reason for CRM: Patient's wife called and said that nurse called and spoke with him earlier. He is confused and does not understand what the nurse said. Hilda Blades (wife) would like the nurse to call her at work only at [phone] >> Mar 21, 2018 10:24 AM Conception Chancy, NT wrote: Patient wife is calling back and states she is at work and it is hard for her to keep calling back. Please advise.

## 2018-03-21 NOTE — Telephone Encounter (Addendum)
Attempted to contact pt's wife. Left message for her to call back. However, I did not call this pt yesterday and not sure if it was someone from our office that did. However, I do see where pt was recently referred to [facility].  Maybe there office called to schedule his appt.

## 2018-03-22 NOTE — Telephone Encounter (Signed)
Patient has an appt with [facility] on 03/30/18 at 10:45am.  Patient's wife said if anyone needs to talk to her further, please call and leave a message with your name.

## 2018-03-22 NOTE — Telephone Encounter (Signed)
Attempted to contact pt's wife. Left message for her to call back. However, I did not call this pt today and not sure if it was someone from our office that did. However, I do see where pt was recently referred to [facility].  Maybe there office called to schedule his appt.

## 2018-03-23 NOTE — Telephone Encounter (Signed)
Noted  

## 2018-03-30 ENCOUNTER — Ambulatory Visit (INDEPENDENT_AMBULATORY_CARE_PROVIDER_SITE_OTHER): Payer: Medicare HMO | Admitting: Urology

## 2018-03-30 ENCOUNTER — Encounter: Payer: Self-pay | Admitting: Urology

## 2018-03-30 ENCOUNTER — Other Ambulatory Visit: Payer: Self-pay

## 2018-03-30 VITALS — BP 83/52 | HR 86 | Ht 70.0 in | Wt 177.0 lb

## 2018-03-30 DIAGNOSIS — R310 Gross hematuria: Secondary | ICD-10-CM | POA: Diagnosis not present

## 2018-03-30 DIAGNOSIS — R3129 Other microscopic hematuria: Secondary | ICD-10-CM | POA: Diagnosis not present

## 2018-03-30 DIAGNOSIS — R3121 Asymptomatic microscopic hematuria: Secondary | ICD-10-CM

## 2018-03-30 LAB — MICROSCOPIC EXAMINATION: WBC UA: NONE SEEN /HPF (ref 0–5)

## 2018-03-30 LAB — URINALYSIS, COMPLETE
BILIRUBIN UA: NEGATIVE
Glucose, UA: NEGATIVE
Leukocytes, UA: NEGATIVE
NITRITE UA: NEGATIVE
PH UA: 5.5 (ref 5.0–7.5)
Specific Gravity, UA: 1.02 (ref 1.005–1.030)
UUROB: 1 mg/dL (ref 0.2–1.0)
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
## 2018-04-24 ENCOUNTER — Ambulatory Visit
Admission: RE | Admit: 2018-04-24 | Discharge: 2018-04-24 | Disposition: A | Discharge status: Home / Self Care | Payer: Medicare HMO | Source: Ambulatory Visit | Attending: Urology | Admitting: Urology

## 2018-04-24 DIAGNOSIS — R3129 Other microscopic hematuria: Secondary | ICD-10-CM | POA: Insufficient documentation

## 2018-04-24 DIAGNOSIS — N281 Cyst of kidney, acquired: Secondary | ICD-10-CM | POA: Insufficient documentation

## 2018-04-24 DIAGNOSIS — N200 Calculus of kidney: Secondary | ICD-10-CM | POA: Diagnosis not present

## 2018-04-24 DIAGNOSIS — N3289 Other specified disorders of bladder: Secondary | ICD-10-CM | POA: Insufficient documentation

## 2018-04-24 LAB — POCT I-STAT CREATININE: CREATININE: 1.4 mg/dL — AB (ref 0.61–1.24)

## 2018-04-24 MED ORDER — IOPAMIDOL (ISOVUE-300) INJECTION 61%
125.0000 mL | Freq: Once | INTRAVENOUS | Status: AC | PRN
  Administered 2018-04-24: 125 mL via INTRAVENOUS

## 2018-04-25 ENCOUNTER — Other Ambulatory Visit: Payer: Self-pay | Admitting: Family Medicine

## 2018-04-25 DIAGNOSIS — R351 Nocturia: Secondary | ICD-10-CM

## 2018-04-25 DIAGNOSIS — G20 Parkinson's disease: Secondary | ICD-10-CM

## 2018-04-25 DIAGNOSIS — G20A1 Parkinson's disease without dyskinesia, without mention of fluctuations: Secondary | ICD-10-CM

## 2018-04-25 DIAGNOSIS — D649 Anemia, unspecified: Secondary | ICD-10-CM

## 2018-04-25 DIAGNOSIS — N401 Enlarged prostate with lower urinary tract symptoms: Secondary | ICD-10-CM

## 2018-04-25 DIAGNOSIS — I10 Essential (primary) hypertension: Secondary | ICD-10-CM

## 2018-04-26 ENCOUNTER — Ambulatory Visit (INDEPENDENT_AMBULATORY_CARE_PROVIDER_SITE_OTHER): Payer: Medicare HMO

## 2018-04-26 VITALS — BP 126/84 | HR 90 | Temp 98.5°F | Ht 67.5 in | Wt 175.8 lb

## 2018-04-26 DIAGNOSIS — D649 Anemia, unspecified: Secondary | ICD-10-CM | POA: Diagnosis not present

## 2018-04-26 DIAGNOSIS — Z0000 Encounter for general adult medical examination without abnormal findings: Secondary | ICD-10-CM | POA: Diagnosis not present

## 2018-04-26 DIAGNOSIS — N401 Enlarged prostate with lower urinary tract symptoms: Secondary | ICD-10-CM | POA: Diagnosis not present

## 2018-04-26 DIAGNOSIS — R351 Nocturia: Secondary | ICD-10-CM

## 2018-04-26 DIAGNOSIS — I10 Essential (primary) hypertension: Secondary | ICD-10-CM

## 2018-04-26 LAB — CBC WITH DIFFERENTIAL/PLATELET
Basophils Absolute: 0 10*3/uL (ref 0.0–0.1)
Basophils Relative: 0.4 % (ref 0.0–3.0)
EOS ABS: 0.1 10*3/uL (ref 0.0–0.7)
EOS PCT: 1.7 % (ref 0.0–5.0)
HEMATOCRIT: 39.2 % (ref 39.0–52.0)
HEMOGLOBIN: 12.7 g/dL — AB (ref 13.0–17.0)
LYMPHS PCT: 35.7 % (ref 12.0–46.0)
Lymphs Abs: 2.3 10*3/uL (ref 0.7–4.0)
MCHC: 32.5 g/dL (ref 30.0–36.0)
MCV: 85.2 fl (ref 78.0–100.0)
MONOS PCT: 10.4 % (ref 3.0–12.0)
Monocytes Absolute: 0.7 10*3/uL (ref 0.1–1.0)
Neutro Abs: 3.4 10*3/uL (ref 1.4–7.7)
Neutrophils Relative %: 51.8 % (ref 43.0–77.0)
Platelets: 258 10*3/uL (ref 150.0–400.0)
RBC: 4.59 Mil/uL (ref 4.22–5.81)
RDW: 14.6 % (ref 11.5–15.5)
WBC: 6.6 10*3/uL (ref 4.0–10.5)

## 2018-04-26 LAB — COMPREHENSIVE METABOLIC PANEL
ALBUMIN: 4.1 g/dL (ref 3.5–5.2)
ALT: 9 U/L (ref 0–53)
AST: 12 U/L (ref 0–37)
Alkaline Phosphatase: 47 U/L (ref 39–117)
BUN: 20 mg/dL (ref 6–23)
CALCIUM: 9.8 mg/dL (ref 8.4–10.5)
CHLORIDE: 105 meq/L (ref 96–112)
CO2: 33 mEq/L — ABNORMAL HIGH (ref 19–32)
Creatinine, Ser: 1.4 mg/dL (ref 0.40–1.50)
GFR: 63.65 mL/min (ref >60.00)
GLUCOSE: 109 mg/dL — AB (ref 70–99)
POTASSIUM: 4.2 meq/L (ref 3.5–5.1)
SODIUM: 142 meq/L (ref 135–145)
TOTAL PROTEIN: 7.2 g/dL (ref 6.0–8.3)
Total Bilirubin: 1 mg/dL (ref 0.2–1.2)

## 2018-04-26 LAB — LIPID PANEL
Cholesterol: 164 mg/dL (ref 0–200)
HDL: 55.1 mg/dL (ref >39.00)
LDL CALC: 92 mg/dL (ref 0–99)
NONHDL: 108.51
Total CHOL/HDL Ratio: 3
Triglycerides: 85 mg/dL (ref 0.0–149.0)
VLDL: 17 mg/dL (ref 0.0–40.0)

## 2018-04-26 LAB — FERRITIN: Ferritin: 69.2 ng/mL (ref 22.0–322.0)

## 2018-04-26 LAB — FOLATE: FOLATE: 16.5 ng/mL (ref >5.9)

## 2018-04-26 LAB — PSA: PSA: 1.98 ng/mL (ref 0.10–4.00)

## 2018-04-26 LAB — VITAMIN B12: Vitamin B-12: 161 pg/mL — ABNORMAL LOW (ref 211–911)

## 2018-04-26 NOTE — Patient Instructions (Signed)
Brandon Manning , Thank you for taking time to come for your Medicare Wellness Visit. I appreciate your ongoing commitment to your health goals. Please review the following plan we discussed and let me know if I can assist you in the future.   These are the goals we discussed: Goals    . Increase physical activity     Starting 04/26/2018, I will continue to walk at least 20 minutes daily.        This is a list of the screening recommended for you and due dates:  Health Maintenance  Topic Date Due  . Flu Shot  10/18/2018*  . DTaP/Tdap/Td vaccine (1 - Tdap) 07/19/2019*  . Colon Cancer Screening  07/19/2018  . Tetanus Vaccine  07/19/2018  .  Hepatitis C: One time screening is recommended by Center for Disease Control  (CDC) for  adults born from [birth year] through 1967.   Completed  . Pneumonia vaccines  Completed  *Topic was postponed. The date shown is not the original due date.   Preventive Care for Adults  A healthy lifestyle and preventive care can promote health and wellness. Preventive health guidelines for adults include the following key practices.  . A routine yearly physical is a good way to check with your health care provider about your health and preventive screening. It is a chance to share any concerns and updates on your health and to receive a thorough exam.  . Visit your dentist for a routine exam and preventive care every 6 months. Brush your teeth twice a day and floss once a day. Good oral hygiene prevents tooth decay and gum disease.  . The frequency of eye exams is based on your age, health, family medical history, use  of contact lenses, and other factors. Follow your health care provider's recommendations for frequency of eye exams.  . Eat a healthy diet. Foods like vegetables, fruits, whole grains, low-fat dairy products, and lean protein foods contain the nutrients you need without too many calories. Decrease your intake of foods high in solid fats, added sugars, and  salt. Eat the right amount of calories for you. Get information about a proper diet from your health care provider, if necessary.  . Regular physical exercise is one of the most important things you can do for your health. Most adults should get at least 150 minutes of moderate-intensity exercise (any activity that increases your heart rate and causes you to sweat) each week. In addition, most adults need muscle-strengthening exercises on 2 or more days a week.  Silver Sneakers may be a benefit available to you. To determine eligibility, you may visit the website: www.silversneakers.com or contact program at (571)435-2801 Mon-Fri between 8AM-8PM.   . Maintain a healthy weight. The body mass index (BMI) is a screening tool to identify possible weight problems. It provides an estimate of body fat based on height and weight. Your health care provider can find your BMI and can help you achieve or maintain a healthy weight.   For adults 20 years and older: ? A BMI below 18.5 is considered underweight. ? A BMI of 18.5 to 24.9 is normal. ? A BMI of 25 to 29.9 is considered overweight. ? A BMI of 30 and above is considered obese.   . Maintain normal blood lipids and cholesterol levels by exercising and minimizing your intake of saturated fat. Eat a balanced diet with plenty of fruit and vegetables. Blood tests for lipids and cholesterol should begin at age 66 and  be repeated every 5 years. If your lipid or cholesterol levels are high, you are over 50, or you are at high risk for heart disease, you may need your cholesterol levels checked more frequently. Ongoing high lipid and cholesterol levels should be treated with medicines if diet and exercise are not working.  . If you smoke, find out from your health care provider how to quit. If you do not use tobacco, please do not start.  . If you choose to drink alcohol, please do not consume more than 2 drinks per day. One drink is considered to be 12 ounces  (355 mL) of beer, 5 ounces (148 mL) of wine, or 1.5 ounces (44 mL) of liquor.  . If you are 48-33 years old, ask your health care provider if you should take aspirin to prevent strokes.  . Use sunscreen. Apply sunscreen liberally and repeatedly throughout the day. You should seek shade when your shadow is shorter than you. Protect yourself by wearing long sleeves, pants, a wide-brimmed hat, and sunglasses year round, whenever you are outdoors.  . Once a month, do a whole body skin exam, using a mirror to look at the skin on your back. Tell your health care provider of new moles, moles that have irregular borders, moles that are larger than a pencil eraser, or moles that have changed in shape or color.
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
## 2018-04-26 NOTE — Progress Notes (Signed)
PCP notes:   Health maintenance:  Flu vaccine - pt declined  Abnormal screenings:   Hearing - failed  Hearing Screening   125Hz  250Hz  500Hz  1000Hz  2000Hz  3000Hz  4000Hz  6000Hz  8000Hz   Right ear:   0 0 0  40    Left ear:   40 0 40  0     Mini-Cog score: 14/20 MMSE - Mini Mental State Exam 04/26/2018 03/11/2017 04/30/2016  Not completed: - Unable to complete Unable to complete  Orientation to time 5 - -  Orientation to Place 5 - -  Registration 3 - -  Attention/ Calculation 0 - -  Recall 0 - -  Recall-comments unable to recall 3 of 3 words - -  Language- name 2 objects 0 - -  Language- repeat 1 - -  Language- follow 3 step command 0 - -  Language- follow 3 step command-comments unable to follow 3 steps of 3 step command - -  Language- read & follow direction 0 - -  Write a sentence 0 - -  Copy design 0 - -  Total score 14 - -    Patient concerns:   None  Nurse concerns:  None  Next PCP appt:   05/10/18 @ 1600

## 2018-04-27 ENCOUNTER — Encounter: Payer: Self-pay | Admitting: Urology

## 2018-04-27 ENCOUNTER — Ambulatory Visit (INDEPENDENT_AMBULATORY_CARE_PROVIDER_SITE_OTHER): Payer: Medicare HMO | Admitting: Urology

## 2018-04-27 ENCOUNTER — Other Ambulatory Visit: Payer: Self-pay

## 2018-04-27 VITALS — BP 103/69 | HR 88 | Ht 69.0 in | Wt 175.0 lb

## 2018-04-27 DIAGNOSIS — R3121 Asymptomatic microscopic hematuria: Secondary | ICD-10-CM

## 2018-04-27 DIAGNOSIS — R3129 Other microscopic hematuria: Secondary | ICD-10-CM

## 2018-04-27 LAB — URINALYSIS, COMPLETE
Bilirubin, UA: NEGATIVE
Glucose, UA: NEGATIVE
Leukocytes, UA: NEGATIVE
NITRITE UA: NEGATIVE
Specific Gravity, UA: 1.025 (ref 1.005–1.030)
Urobilinogen, Ur: 1 mg/dL (ref 0.2–1.0)
pH, UA: 5.5 (ref 5.0–7.5)

## 2018-04-27 LAB — MICROSCOPIC EXAMINATION

## 2018-04-27 NOTE — Progress Notes (Signed)
Cystoscopy Procedure Note:  Indication: Microscopic hematuria  After informed consent and discussion of the procedure and its risks, Brandon Manning was positioned and prepped in the standard fashion. Cystoscopy was performed with a flexible cystoscope. The urethra, bladder neck and entire bladder was visualized in a standard fashion. The prostate was small with a TURP defect. The ureteral orifices were visualized in their normal location and orientation.  Mild trabeculations.  Normal bladder mucosa throughout  Imaging:  CT urogram dated 04/24/2018 personally reviewed.  No hydronephrosis, renal masses, or filling defects.  Left midpole nonobstructive renal stone  Findings: Normal cystoscopy,  Assessment and Plan: No follow-up needed  Nickolas Madrid, MD 04/27/2018

## 2018-05-03 NOTE — Progress Notes (Signed)
I reviewed health advisor's note, was available for consultation, and agree with documentation and plan.  

## 2018-05-09 ENCOUNTER — Encounter: Payer: Self-pay | Admitting: Family Medicine

## 2018-05-09 NOTE — Assessment & Plan Note (Signed)
Preventative protocols reviewed and updated unless pt declined. Discussed healthy diet and lifestyle.  

## 2018-05-09 NOTE — Progress Notes (Signed)
BP 124/80 (BP Location: Left Arm, Patient Position: Sitting, Cuff Size: Normal)   Pulse 85   Temp 98.4 F (36.9 C) (Oral)   Ht 5' 7.5" (1.715 m)   Wt 176 lb (79.8 kg)   SpO2 99%   BMI 27.16 kg/m    CC: CPE Subjective:    Patient ID: Brandon Manning, male    DOB: 1945/08/18, 72 y.o.   MRN: [id number]  HPI: Brandon Manning is a 72 y.o. male presenting on 05/10/2018 for Annual Exam (Pt 2. Wants to discuss Myrbetriq.)   Saw Lesia 2 weeks ago for medicare wellness visit. Note reviewed.  14/20 mini-cog score. Failed hearing screen.   Hematuria - recent eval by [facility]) with normal cystoscopy and CT abdomen (L midpole non obstructive renal stone).   Myrbetriq has helped night time urination.   Preventative: Colon screening - would like colonoscopy possibly this spring. discussed - will do cologuard.  Prostate screening - DRE/PSA normal last year, declines today.  Flu shot - declines  Pneumovax 2014. prevnar 2016  Last tetanus shot 2008.  Shingles shot - discussed, declines.  Advanced directives: has at home. Wife would be medical decision maker. Will bring me copy. Seat belt use discussed Sunscreen use discussed. No changing moles on skin.  Ex smoker - remotely Alcohol - none Dentist yearly Eye exam yearly   Caffeine: occasional caffeine Lives with wife, 85yo son Occupation: retired, worked at [employer]: 8th grade  Activity: walking outside  Diet: fruits/vegetables daily, water, occasional red meat, fish 1x/wk   Relevant past medical, surgical, family and social history reviewed and updated as indicated. Interim medical history since our last visit reviewed. Allergies and medications reviewed and updated. Outpatient Medications Prior to Visit  Medication Sig Dispense Refill  . aspirin EC 81 MG tablet Take 81 mg by mouth every other day.    . carbidopa-levodopa (SINEMET IR) 25-100 MG tablet TAKE 2 TABLETS BY MOUTH AT  7AM, 1 TABLET AT 12 NOON AND 1 TABLET AT 5PM 360 tablet 0  . finasteride (PROSCAR) 5 MG tablet Take 1 tablet (5 mg total) by mouth daily. 90 tablet 3  . midodrine (PROAMATINE) 5 MG tablet 1 in the AM and noon, 1/2 tablet at bed 225 tablet 1  . Multiple Vitamins-Minerals (MULTIVITAMIN PO) Take 1 tablet by mouth daily.    . polyethylene glycol powder (MIRALAX) powder Take 255 g by mouth daily as needed.    Marland Kitchen HYDROcodone-acetaminophen (NORCO/VICODIN) 5-325 MG tablet Take 0.5 tablets by mouth 2 (two) times daily as needed for moderate pain. 30 tablet 0  . mirabegron ER (MYRBETRIQ) 25 MG TB24 tablet Take 1 tablet (25 mg total) by mouth daily. 30 tablet 1   No facility-administered medications prior to visit.      Per HPI unless specifically indicated in ROS section below Review of Systems  Constitutional: Negative for activity change, appetite change, chills, fatigue, fever and unexpected weight change.  HENT: Negative for hearing loss.   Eyes: Negative for visual disturbance.  Respiratory: Positive for cough (mild). Negative for chest tightness, shortness of breath and wheezing.   Cardiovascular: Negative for chest pain, palpitations and leg swelling.  Gastrointestinal: Negative for abdominal distention, abdominal pain, blood in stool, constipation, diarrhea, nausea and vomiting.  Genitourinary: Positive for hematuria (completed benign urology eval). Negative for difficulty urinating.  Musculoskeletal: Negative for arthralgias, myalgias and neck pain.  Skin: Negative for rash.  Neurological: Negative for dizziness, seizures, syncope and  headaches.  Hematological: Negative for adenopathy. Does not bruise/bleed easily.  Psychiatric/Behavioral: Negative for dysphoric mood. The patient is not nervous/anxious.        Objective:    BP 124/80 (BP Location: Left Arm, Patient Position: Sitting, Cuff Size: Normal)   Pulse 85   Temp 98.4 F (36.9 C) (Oral)   Ht 5' 7.5" (1.715 m)   Wt 176 lb (79.8  kg)   SpO2 99%   BMI 27.16 kg/m   Wt Readings from Last 3 Encounters:  05/10/18 176 lb (79.8 kg)  04/27/18 175 lb (79.4 kg)  04/26/18 175 lb 12 oz (79.7 kg)    Physical Exam  Constitutional: He is oriented to person, place, and time. He appears well-developed and well-nourished. No distress.  HENT:  Head: Normocephalic and atraumatic.  Right Ear: Hearing, tympanic membrane, external ear and ear canal normal.  Left Ear: Hearing, tympanic membrane, external ear and ear canal normal.  Nose: Nose normal.  Mouth/Throat: Uvula is midline, oropharynx is clear and moist and mucous membranes are normal. No oropharyngeal exudate, posterior oropharyngeal edema or posterior oropharyngeal erythema.  Eyes: Pupils are equal, round, and reactive to light. Conjunctivae and EOM are normal. No scleral icterus.  Neck: Normal range of motion. Neck supple.  Cardiovascular: Normal rate, regular rhythm, normal heart sounds and intact distal pulses.  No murmur heard. Pulses:      Radial pulses are 2+ on the right side, and 2+ on the left side.  Pulmonary/Chest: Effort normal and breath sounds normal. No respiratory distress. He has no wheezes. He has no rales.  Abdominal: Soft. Bowel sounds are normal. He exhibits no distension and no mass. There is no tenderness. There is no rebound and no guarding.  Musculoskeletal: Normal range of motion. He exhibits no edema.  Lymphadenopathy:    He has no cervical adenopathy.  Neurological: He is alert and oriented to person, place, and time.  Stiff movements from PD Shuffling gait  Skin: Skin is warm and dry. No rash noted.  Psychiatric: He has a normal mood and affect. His behavior is normal. Judgment and thought content normal.  Nursing note and vitals reviewed.  Results for orders placed or performed in visit on 04/27/18  Microscopic Examination  Result Value Ref Range   WBC, UA 0-5 0 - 5 /hpf   RBC, UA 3-10 (A) 0 - 2 /hpf   Epithelial Cells (non renal) 0-10  0 - 10 /hpf   Casts Present (A) None seen /lpf   Cast Type Hyaline casts N/A   Mucus, UA Present (A) Not Estab.   Bacteria, UA Moderate (A) None seen/Few  Urinalysis, Complete  Result Value Ref Range   Specific Gravity, UA 1.025 1.005 - 1.030   pH, UA 5.5 5.0 - 7.5   Color, UA Yellow Yellow   Appearance Ur Clear Clear   Leukocytes, UA Negative Negative   Protein, UA 1+ (A) Negative/Trace   Glucose, UA Negative Negative   Ketones, UA Trace (A) Negative   RBC, UA Trace (A) Negative   Bilirubin, UA Negative Negative   Urobilinogen, Ur 1.0 0.2 - 1.0 mg/dL   Nitrite, UA Negative Negative   Microscopic Examination See below:       Assessment & Plan:   Problem List Items Addressed This Visit    Vitamin B12 deficiency    New - will provide IM B12 1052mcg today then start 1011mcg PO daily, dissolvable if able to find.       Relevant Medications  cyanocobalamin ((VITAMIN B-12)) injection 1,000 mcg (Completed)   Urge incontinence    Improved with myrbetriq - will refill.       Relevant Medications   mirabegron ER (MYRBETRIQ) 25 MG TB24 tablet   Parkinson disease ([place])    Appreciate neuro care.       HTN (hypertension)    Chronic, stable off medication - permissive HTN in setting of orthostasis in PD.      Hematuria    Completed reassuring urology evaluation 2019      Health maintenance examination - Primary    Preventative protocols reviewed and updated unless pt declined. Discussed healthy diet and lifestyle.       Encounter for chronic pain management    [place] CSRS reviewed and appropriate. Refilled today.       Chronic constipation    Managing with miralax.       BPH (benign prostatic hyperplasia)    Continue finasteride. Avoiding flomax due to orthostasis.       Bilateral low back pain without sciatica   Relevant Medications   HYDROcodone-acetaminophen (NORCO/VICODIN) 5-325 MG tablet   Advanced care planning/counseling discussion    Advanced directives:  has at home. Wife would be medical decision maker. Will bring me copy.          Meds ordered this encounter  Medications  . HYDROcodone-acetaminophen (NORCO/VICODIN) 5-325 MG tablet    Sig: Take 0.5 tablets by mouth 2 (two) times daily as needed for moderate pain.    Dispense:  30 tablet    Refill:  0  . mirabegron ER (MYRBETRIQ) 25 MG TB24 tablet    Sig: Take 1 tablet (25 mg total) by mouth daily.    Dispense:  30 tablet    Refill:  11  . Cyanocobalamin (B-12) 1000 MCG SUBL    Sig: Place 1 tablet under the tongue daily.    Dispense:  30 each  . cyanocobalamin ((VITAMIN B-12)) injection 1,000 mcg   No orders of the defined types were placed in this encounter.   Follow up plan: Return in about 3 months (around 08/10/2018) for follow up visit.  [name], MD

## 2018-05-10 ENCOUNTER — Encounter: Payer: Self-pay | Admitting: Family Medicine

## 2018-05-10 ENCOUNTER — Ambulatory Visit (INDEPENDENT_AMBULATORY_CARE_PROVIDER_SITE_OTHER): Payer: Medicare HMO | Admitting: Family Medicine

## 2018-05-10 VITALS — BP 124/80 | HR 85 | Temp 98.4°F | Ht 67.5 in | Wt 176.0 lb

## 2018-05-10 DIAGNOSIS — G20 Parkinson's disease: Secondary | ICD-10-CM

## 2018-05-10 DIAGNOSIS — K5909 Other constipation: Secondary | ICD-10-CM | POA: Diagnosis not present

## 2018-05-10 DIAGNOSIS — E538 Deficiency of other specified B group vitamins: Secondary | ICD-10-CM

## 2018-05-10 DIAGNOSIS — Z7189 Other specified counseling: Secondary | ICD-10-CM | POA: Diagnosis not present

## 2018-05-10 DIAGNOSIS — M545 Low back pain: Secondary | ICD-10-CM

## 2018-05-10 DIAGNOSIS — R351 Nocturia: Secondary | ICD-10-CM

## 2018-05-10 DIAGNOSIS — G8929 Other chronic pain: Secondary | ICD-10-CM

## 2018-05-10 DIAGNOSIS — I10 Essential (primary) hypertension: Secondary | ICD-10-CM | POA: Diagnosis not present

## 2018-05-10 DIAGNOSIS — R3121 Asymptomatic microscopic hematuria: Secondary | ICD-10-CM | POA: Diagnosis not present

## 2018-05-10 DIAGNOSIS — N401 Enlarged prostate with lower urinary tract symptoms: Secondary | ICD-10-CM

## 2018-05-10 DIAGNOSIS — Z0000 Encounter for general adult medical examination without abnormal findings: Secondary | ICD-10-CM

## 2018-05-10 DIAGNOSIS — N3941 Urge incontinence: Secondary | ICD-10-CM | POA: Diagnosis not present

## 2018-05-10 MED ORDER — B-12 1000 MCG SL SUBL: 1.0000 | SUBLINGUAL_TABLET | Freq: Every day | SUBLINGUAL | Status: DC

## 2018-05-10 MED ORDER — MIRABEGRON ER 25 MG PO TB24: 25.0000 mg | ORAL_TABLET | Freq: Every day | ORAL | 11 refills | Status: DC

## 2018-05-10 MED ORDER — CYANOCOBALAMIN 1000 MCG/ML IJ SOLN
1000.0000 ug | Freq: Once | INTRAMUSCULAR | Status: AC
  Administered 2018-05-10: 1000 ug via INTRAMUSCULAR

## 2018-05-10 MED ORDER — HYDROCODONE-ACETAMINOPHEN 5-325 MG PO TABS: 0.5000 | ORAL_TABLET | Freq: Two times a day (BID) | ORAL | 0 refills | Status: DC | PRN

## 2018-05-10 NOTE — Assessment & Plan Note (Signed)
Chronic, stable off medication - permissive HTN in setting of orthostasis in PD.

## 2018-05-10 NOTE — Patient Instructions (Addendum)
B12 shot today Start oral b12 1018mcg daily over the counter - dissolvable type if able to find.  Bring [place] a copy of your advanced directives myrbetriq refilled - check with [facility] on cost for 3 month supply Return in 3 months for follow up visit (pain visit).   Health Maintenance, Male A healthy lifestyle and preventive care is important for your health and wellness. Ask your health care provider about what schedule of regular examinations is right for you. What should I know about weight and diet? Eat a Healthy Diet  Eat plenty of vegetables, fruits, whole grains, low-fat dairy products, and lean protein.  Do not eat a lot of foods high in solid fats, added sugars, or salt.  Maintain a Healthy Weight Regular exercise can help you achieve or maintain a healthy weight. You should:  Do at least 150 minutes of exercise each week. The exercise should increase your heart rate and make you sweat (moderate-intensity exercise).  Do strength-training exercises at least twice a week.  Watch Your Levels of Cholesterol and Blood Lipids  Have your blood tested for lipids and cholesterol every 5 years starting at 74 years of age. If you are at high risk for heart disease, you should start having your blood tested when you are 74 years old. You may need to have your cholesterol levels checked more often if: ? Your lipid or cholesterol levels are high. ? You are older than 74 years of age. ? You are at high risk for heart disease.  What should I know about cancer screening? Many types of cancers can be detected early and may often be prevented. Lung Cancer  You should be screened every year for lung cancer if: ? You are a current smoker who has smoked for at least 30 years. ? You are a former smoker who has quit within the past 15 years.  Talk to your health care provider about your screening options, when you should start screening, and how often you should be screened.  Colorectal  Cancer  Routine colorectal cancer screening usually begins at 74 years of age and should be repeated every 5-10 years until you are 74 years old. You may need to be screened more often if early forms of precancerous polyps or small growths are found. Your health care provider may recommend screening at an earlier age if you have risk factors for colon cancer.  Your health care provider may recommend using home test kits to check for hidden blood in the stool.  A small camera at the end of a tube can be used to examine your colon (sigmoidoscopy or colonoscopy). This checks for the earliest forms of colorectal cancer.  Prostate and Testicular Cancer  Depending on your age and overall health, your health care provider may do certain tests to screen for prostate and testicular cancer.  Talk to your health care provider about any symptoms or concerns you have about testicular or prostate cancer.  Skin Cancer  Check your skin from head to toe regularly.  Tell your health care provider about any new moles or changes in moles, especially if: ? There is a change in a mole's size, shape, or color. ? You have a mole that is larger than a pencil eraser.  Always use sunscreen. Apply sunscreen liberally and repeat throughout the day.  Protect yourself by wearing long sleeves, pants, a wide-brimmed hat, and sunglasses when outside.  What should I know about heart disease, diabetes, and high blood pressure?  If you are 67-58 years of age, have your blood pressure checked every 3-5 years. If you are 21 years of age or older, have your blood pressure checked every year. You should have your blood pressure measured twice-once when you are at a hospital or clinic, and once when you are not at a hospital or clinic. Record the average of the two measurements. To check your blood pressure when you are not at a hospital or clinic, you can use: ? An automated blood pressure machine at a pharmacy. ? A home blood  pressure monitor.  Talk to your health care provider about your target blood pressure.  If you are between 13-53 years old, ask your health care provider if you should take aspirin to prevent heart disease.  Have regular diabetes screenings by checking your fasting blood sugar level. ? If you are at a normal weight and have a low risk for diabetes, have this test once every three years after the age of 53. ? If you are overweight and have a high risk for diabetes, consider being tested at a younger age or more often.  A one-time screening for abdominal aortic aneurysm (AAA) by ultrasound is recommended for men aged 36-75 years who are current or former smokers. What should I know about preventing infection? Hepatitis B If you have a higher risk for hepatitis B, you should be screened for this virus. Talk with your health care provider to find out if you are at risk for hepatitis B infection. Hepatitis C Blood testing is recommended for:  Everyone born from [birth year] through 1967.  Anyone with known risk factors for hepatitis C.  Sexually Transmitted Diseases (STDs)  You should be screened each year for STDs including gonorrhea and chlamydia if: ? You are sexually active and are younger than 74 years of age. ? You are older than 74 years of age and your health care provider tells you that you are at risk for this type of infection. ? Your sexual activity has changed since you were last screened and you are at an increased risk for chlamydia or gonorrhea. Ask your health care provider if you are at risk.  Talk with your health care provider about whether you are at high risk of being infected with HIV. Your health care provider may recommend a prescription medicine to help prevent HIV infection.  What else can I do?  Schedule regular health, dental, and eye exams.  Stay current with your vaccines (immunizations).  Do not use any tobacco products, such as cigarettes, chewing tobacco, and  e-cigarettes. If you need help quitting, ask your health care provider.  Limit alcohol intake to no more than 2 drinks per day. One drink equals 12 ounces of beer, 5 ounces of wine, or 1 ounces of hard liquor.  Do not use street drugs.  Do not share needles.  Ask your health care provider for help if you need support or information about quitting drugs.  Tell your health care provider if you often feel depressed.  Tell your health care provider if you have ever been abused or do not feel safe at home. This information is not intended to replace advice given to you by your health care provider. Make sure you discuss any questions you have with your health care provider. Document Released: 01/01/2008 Document Revised: 03/03/2016 Document Reviewed: 04/08/2015 Elsevier Interactive Patient Education  [employer].

## 2018-05-10 NOTE — Assessment & Plan Note (Signed)
Continue finasteride. Avoiding flomax due to orthostasis.

## 2018-05-10 NOTE — Assessment & Plan Note (Signed)
Improved with myrbetriq - will refill.

## 2018-05-10 NOTE — Assessment & Plan Note (Signed)
[place] CSRS reviewed and appropriate. Refilled today.

## 2018-05-10 NOTE — Assessment & Plan Note (Signed)
Advanced directives: has at home. Wife would be medical decision maker. Will bring me copy. 

## 2018-05-10 NOTE — Assessment & Plan Note (Signed)
Appreciate neuro care.

## 2018-05-10 NOTE — Assessment & Plan Note (Signed)
Completed reassuring urology evaluation 2019

## 2018-05-10 NOTE — Assessment & Plan Note (Signed)
New - will provide IM B12 1029mcg today then start 1035mcg PO daily, dissolvable if able to find.

## 2018-05-10 NOTE — Assessment & Plan Note (Signed)
Managing with miralax.

## 2018-05-22 ENCOUNTER — Other Ambulatory Visit: Payer: Self-pay | Admitting: Neurology

## 2018-05-22 DIAGNOSIS — G20 Parkinson's disease: Secondary | ICD-10-CM

## 2018-08-03 ENCOUNTER — Other Ambulatory Visit: Payer: Self-pay | Admitting: Family Medicine

## 2018-08-09 ENCOUNTER — Encounter: Payer: Self-pay | Admitting: Family Medicine

## 2018-08-09 ENCOUNTER — Ambulatory Visit (INDEPENDENT_AMBULATORY_CARE_PROVIDER_SITE_OTHER): Payer: Medicare HMO | Admitting: Family Medicine

## 2018-08-09 VITALS — BP 130/86 | HR 78 | Temp 98.6°F | Ht 67.5 in | Wt 175.2 lb

## 2018-08-09 DIAGNOSIS — G20 Parkinson's disease: Secondary | ICD-10-CM | POA: Diagnosis not present

## 2018-08-09 DIAGNOSIS — G8929 Other chronic pain: Secondary | ICD-10-CM

## 2018-08-09 DIAGNOSIS — M545 Low back pain: Secondary | ICD-10-CM

## 2018-08-09 DIAGNOSIS — N3941 Urge incontinence: Secondary | ICD-10-CM

## 2018-08-09 MED ORDER — HYDROCODONE-ACETAMINOPHEN 5-325 MG PO TABS: 0.5000 | ORAL_TABLET | Freq: Two times a day (BID) | ORAL | 0 refills | Status: DC | PRN

## 2018-08-09 NOTE — Progress Notes (Signed)
BP 130/86 (BP Location: Left Arm, Patient Position: Sitting, Cuff Size: Large)   Pulse 78   Temp 98.6 F (37 C) (Oral)   Ht 5' 7.5" (1.715 m)   Wt 175 lb 4 oz (79.5 kg)   SpO2 99%   BMI 27.04 kg/m    CC: 3 mo chronic pain f/u visit Subjective:    Patient ID: Brandon Manning, male    DOB: [birth year], 73 y.o.   MRN: [id number]  HPI: Brandon Manning is a 73 y.o. male presenting on 08/09/2018 for Chronic Pain (Here for 3 mo f/u. Pt accompanied by his wife, [name]. )   PD - followed by neurology Dr Tat. Continues walking at home regularly. Has HEP from prior [facility] PT/OT - does this at home.   Chronic back pain on hydrocodone for breakthrough pain. Denies constipation, sedation, or unsteadiness. Last filled 04/2018 #30.   Urge incontinence/overactive bladder in h/o BPH - myrbetriq has been beneficial.      Relevant past medical, surgical, family and social history reviewed and updated as indicated. Interim medical history since our last visit reviewed. Allergies and medications reviewed and updated. Outpatient Medications Prior to Visit  Medication Sig Dispense Refill  . aspirin EC 81 MG tablet Take 81 mg by mouth every other day.    . carbidopa-levodopa (SINEMET IR) 25-100 MG tablet TAKE 2 TABLETS AT 7AM, 1 TABLET AT 12 NOON AND 1 TABLET AT 5PM 360 tablet 1  . Cyanocobalamin (B-12) 1000 MCG SUBL Place 1 tablet under the tongue daily. 30 each   . finasteride (PROSCAR) 5 MG tablet Take 1 tablet (5 mg total) by mouth daily. 90 tablet 3  . midodrine (PROAMATINE) 5 MG tablet TAKE 1 TABLET BY MOUTH IN THE MORNING, 1 TABLET AT NOON AND ONE-HALF TABLET AT BED 75 tablet 0  . mirabegron ER (MYRBETRIQ) 25 MG TB24 tablet Take 1 tablet (25 mg total) by mouth daily. 30 tablet 11  . Multiple Vitamins-Minerals (MULTIVITAMIN PO) Take 1 tablet by mouth daily.    . polyethylene glycol powder (MIRALAX) powder Take 255 g by mouth daily as needed.    Marland Kitchen HYDROcodone-acetaminophen (NORCO/VICODIN) 5-325 MG  tablet Take 0.5 tablets by mouth 2 (two) times daily as needed for moderate pain. 30 tablet 0   No facility-administered medications prior to visit.      Per HPI unless specifically indicated in ROS section below Review of Systems Objective:    BP 130/86 (BP Location: Left Arm, Patient Position: Sitting, Cuff Size: Large)   Pulse 78   Temp 98.6 F (37 C) (Oral)   Ht 5' 7.5" (1.715 m)   Wt 175 lb 4 oz (79.5 kg)   SpO2 99%   BMI 27.04 kg/m   Wt Readings from Last 3 Encounters:  08/09/18 175 lb 4 oz (79.5 kg)  05/10/18 176 lb (79.8 kg)  04/27/18 175 lb (79.4 kg)    Physical Exam Vitals signs and nursing note reviewed.  Constitutional:      Appearance: Normal appearance. He is not ill-appearing.  HENT:     Head: Normocephalic and atraumatic.  Cardiovascular:     Rate and Rhythm: Normal rate and regular rhythm.     Pulses: Normal pulses.     Heart sounds: Normal heart sounds. No murmur.  Pulmonary:     Effort: Pulmonary effort is normal. No respiratory distress.     Breath sounds: Normal breath sounds. No wheezing, rhonchi or rales.  Neurological:     Mental Status:  He is alert.  Psychiatric:        Mood and Affect: Mood normal.        Behavior: Behavior normal.       Results for orders placed or performed in visit on 04/27/18  Microscopic Examination  Result Value Ref Range   WBC, UA 0-5 0 - 5 /hpf   RBC, UA 3-10 (A) 0 - 2 /hpf   Epithelial Cells (non renal) 0-10 0 - 10 /hpf   Casts Present (A) None seen /lpf   Cast Type Hyaline casts N/A   Mucus, UA Present (A) Not Estab.   Bacteria, UA Moderate (A) None seen/Few  Urinalysis, Complete  Result Value Ref Range   Specific Gravity, UA 1.025 1.005 - 1.030   pH, UA 5.5 5.0 - 7.5   Color, UA Yellow Yellow   Appearance Ur Clear Clear   Leukocytes, UA Negative Negative   Protein, UA 1+ (A) Negative/Trace   Glucose, UA Negative Negative   Ketones, UA Trace (A) Negative   RBC, UA Trace (A) Negative   Bilirubin, UA  Negative Negative   Urobilinogen, Ur 1.0 0.2 - 1.0 mg/dL   Nitrite, UA Negative Negative   Microscopic Examination See below:    Assessment & Plan:   Problem Manning Items Addressed This Visit    Urge incontinence    Chronic, stable on myrbetriq - continue.       Parkinson disease ([place])    Stable period.  Wife endorses he's walking at home regularly with cane and continues doing HEP from prior [facility] PT/OT  Appreciate neurology care.       Encounter for chronic pain management - Primary    [place] CSRS reviewed and appropriate. Hydrocodone refilled for intermittent use. #30 lasts 3 months.       Bilateral low back pain without sciatica    Stable period. Continue walking. Continue PRN hydrocodone. Refilled today.       Relevant Medications   HYDROcodone-acetaminophen (NORCO/VICODIN) 5-325 MG tablet       Meds ordered this encounter  Medications  . HYDROcodone-acetaminophen (NORCO/VICODIN) 5-325 MG tablet    Sig: Take 0.5 tablets by mouth 2 (two) times daily as needed for moderate pain.    Dispense:  30 tablet    Refill:  0   No orders of the defined types were placed in this encounter.   Follow up plan: Return in about 3 months (around 11/08/2018) for follow up visit.  [name], MD

## 2018-08-09 NOTE — Patient Instructions (Addendum)
Continue current medicines.  Hydrocodone refilled today.  You are doing great today! Return as needed or in 3 months for follow up visit.

## 2018-08-09 NOTE — Assessment & Plan Note (Signed)
[place] CSRS reviewed and appropriate. Hydrocodone refilled for intermittent use. #30 lasts 3 months.

## 2018-08-09 NOTE — Assessment & Plan Note (Signed)
Stable period. Continue walking. Continue PRN hydrocodone. Refilled today.

## 2018-08-09 NOTE — Assessment & Plan Note (Signed)
Chronic, stable on myrbetriq - continue.

## 2018-08-09 NOTE — Assessment & Plan Note (Addendum)
Stable period.  Wife endorses he's walking at home regularly with cane and continues doing HEP from prior [facility] PT/OT  Appreciate neurology care.
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
## 2018-08-24 ENCOUNTER — Encounter: Payer: Self-pay | Admitting: Neurology

## 2018-08-24 ENCOUNTER — Ambulatory Visit: Payer: Medicare HMO | Admitting: Neurology

## 2018-08-24 VITALS — BP 138/88 | HR 80 | Ht 70.0 in | Wt 181.0 lb

## 2018-08-24 DIAGNOSIS — F0280 Dementia in other diseases classified elsewhere without behavioral disturbance: Secondary | ICD-10-CM

## 2018-08-24 DIAGNOSIS — G20 Parkinson's disease: Secondary | ICD-10-CM | POA: Diagnosis not present

## 2018-08-24 MED ORDER — CARBIDOPA-LEVODOPA 25-100 MG PO TABS: ORAL_TABLET | ORAL | 1 refills | Status: DC

## 2018-09-05 ENCOUNTER — Ambulatory Visit: Payer: Medicare HMO | Admitting: Neurology

## 2018-09-08 ENCOUNTER — Other Ambulatory Visit: Payer: Self-pay | Admitting: Family Medicine

## 2018-09-09 ENCOUNTER — Other Ambulatory Visit: Payer: Self-pay | Admitting: Family Medicine

## 2018-10-20 ENCOUNTER — Other Ambulatory Visit: Payer: Self-pay | Admitting: Family Medicine

## 2018-10-24 ENCOUNTER — Other Ambulatory Visit: Payer: Self-pay | Admitting: *Deleted

## 2018-10-24 DIAGNOSIS — G20 Parkinson's disease: Secondary | ICD-10-CM

## 2018-10-24 MED ORDER — CARBIDOPA-LEVODOPA 25-100 MG PO TABS: ORAL_TABLET | ORAL | 1 refills | Status: DC

## 2018-11-08 ENCOUNTER — Ambulatory Visit: Payer: Medicare HMO | Admitting: Family Medicine

## 2018-12-05 ENCOUNTER — Telehealth: Payer: Self-pay

## 2018-12-05 NOTE — Telephone Encounter (Signed)
Copied from [place] [id number]. Topic: Appointment Scheduling - Scheduling Inquiry for Clinic >> Dec 04, 2018  4:29 PM Reyne Dumas L wrote: Reason for CRM:   Pt's wife calling because pt needs an appointment as he is unable to hold water and she is having to keep him in depends.  Wife can be reached at [phone]

## 2018-12-05 NOTE — Telephone Encounter (Signed)
Pt has appt for 12/06/18 @ 1pm

## 2018-12-06 ENCOUNTER — Encounter: Payer: Self-pay | Admitting: Family Medicine

## 2018-12-06 ENCOUNTER — Ambulatory Visit (INDEPENDENT_AMBULATORY_CARE_PROVIDER_SITE_OTHER): Payer: Medicare HMO | Admitting: Family Medicine

## 2018-12-06 ENCOUNTER — Other Ambulatory Visit: Payer: Self-pay

## 2018-12-06 VITALS — BP 158/98 | HR 76 | Temp 98.4°F | Ht 67.5 in | Wt 183.1 lb

## 2018-12-06 DIAGNOSIS — R351 Nocturia: Secondary | ICD-10-CM | POA: Diagnosis not present

## 2018-12-06 DIAGNOSIS — N401 Enlarged prostate with lower urinary tract symptoms: Secondary | ICD-10-CM | POA: Diagnosis not present

## 2018-12-06 DIAGNOSIS — N3941 Urge incontinence: Secondary | ICD-10-CM | POA: Diagnosis not present

## 2018-12-06 DIAGNOSIS — I10 Essential (primary) hypertension: Secondary | ICD-10-CM | POA: Diagnosis not present

## 2018-12-06 DIAGNOSIS — G20 Parkinson's disease: Secondary | ICD-10-CM | POA: Diagnosis not present

## 2018-12-06 DIAGNOSIS — G8929 Other chronic pain: Secondary | ICD-10-CM | POA: Diagnosis not present

## 2018-12-06 DIAGNOSIS — M545 Low back pain: Secondary | ICD-10-CM

## 2018-12-06 LAB — POC URINALSYSI DIPSTICK (AUTOMATED)
Bilirubin, UA: NEGATIVE
Blood, UA: NEGATIVE
Glucose, UA: NEGATIVE
Ketones, UA: NEGATIVE
Leukocytes, UA: NEGATIVE
Nitrite, UA: NEGATIVE
Protein, UA: NEGATIVE
Spec Grav, UA: 1.02 (ref 1.010–1.025)
Urobilinogen, UA: 0.2 E.U./dL
pH, UA: 7 (ref 5.0–8.0)

## 2018-12-06 MED ORDER — HYDROCODONE-ACETAMINOPHEN 5-325 MG PO TABS: 0.5000 | ORAL_TABLET | Freq: Two times a day (BID) | ORAL | 0 refills | Status: DC | PRN

## 2018-12-06 NOTE — Patient Instructions (Addendum)
Urine looked ok today.  Increase water intake, limit bladder irritants like spicy foods, dark sodas, caffeine.  Watch urinary symptoms for now, if persistent trouble past this week let us know to try full dose myrbetriq.  Watch blood pressures and let me know if consistently elevated >150/90 at home.

## 2018-12-06 NOTE — Assessment & Plan Note (Signed)
Elevated BP readings noted today. Wife will monitor at home and call me if persistently elevated. She states home readings averaging well controlled. Would consider midodrine titration of persistently elevated readings.

## 2018-12-06 NOTE — Assessment & Plan Note (Addendum)
[place] CSRS reviewed and appropriate.  Intermittent hydrocodone, tolerating well.

## 2018-12-06 NOTE — Assessment & Plan Note (Signed)
H/o this, stable period on finasteride. Avoid flomax due to orthostasis.

## 2018-12-06 NOTE — Assessment & Plan Note (Signed)
Acute worsening over the last few days. No signs of infection on UA today. 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 Discussed with patient and wife. Would avoid alpha blockers and antimuscarinics at this time given PD. Discussed option of increasing mirbetriq dosing however hesitant given elevated BP reading in office today. Encouraged good hydration, avoiding bladder irritants at this time. Will monitor symptoms and they will let me know if desire to try higher myrbetriq dose.

## 2018-12-06 NOTE — Progress Notes (Signed)
This visit was conducted in person.  BP (!) 158/98 (BP Location: Right Arm, Cuff Size: Large)   Pulse 76   Temp 98.4 F (36.9 C) (Oral)   Ht 5' 7.5" (1.715 m)   Wt 183 lb 2 oz (83.1 kg)   SpO2 98%   BMI 28.26 kg/m    CC: check bladder Subjective:    Patient ID: Brandon Manning, male    DOB: 1945-08-15, 73 y.o.   MRN: [id number]  HPI: Brandon Manning is a 73 y.o. male presenting on 12/06/2018 for Urinary Frequency (C/o urinary frequency and not being able to hold urine. Sxs have worsened in last few days. Wears Depends at night. )   Known PD followed by neurology Dr Tat.  H/o urge incontinence/overactive bladder in h/o BPH on myrbetriq. Not taking flomax due to hypotension and falls risk in PD. Myrbetriq has been helpful to date. He already takes finasteride 5mg  daily. Has been using depends diapers last 2 days with benefit. No stress incontinence symptoms.   2-3d h/o urinary troubles - trouble controlling urine with accidents prior to getting to bathroom in time. No fevers/chills, nausea/vomiting, flank pain, abd pain. Denies dysuria. No blood in urine. Does feel he fully empties.   BP elevated today - wife thinks he's not been drinking as much water as usual.  BP Readings from Last 3 Encounters:  12/06/18 (!) 158/98  08/24/18 138/88  08/09/18 130/86    Lab Results  Component Value Date   PSA 1.98 04/26/2018   PSA 2.81 07/13/2016   PSA 2.77 06/03/2015    Continues tolerating hydrocodone well, requests refill today.      Relevant past medical, surgical, family and social history reviewed and updated as indicated. Interim medical history since our last visit reviewed. Allergies and medications reviewed and updated. Outpatient Medications Prior to Visit  Medication Sig Dispense Refill  . aspirin EC 81 MG tablet Take 81 mg by mouth every other day.    . carbidopa-levodopa (SINEMET IR) 25-100 MG tablet TAKE 2 TABLETS AT 7AM, 1 TABLET AT 12 NOON AND 1 TABLET AT 5PM 360  tablet 1  . Cyanocobalamin (B-12) 1000 MCG SUBL Place 1 tablet under the tongue daily. 30 each   . finasteride (PROSCAR) 5 MG tablet TAKE 1 TABLET EVERY DAY 90 tablet 3  . midodrine (PROAMATINE) 5 MG tablet TAKE 1 TABLET BY MOUTH IN THE MORNING, 1 TABLET AT NOON, AND ONE-HALF TABLET AT BED 75 tablet 2  . mirabegron ER (MYRBETRIQ) 25 MG TB24 tablet Take 1 tablet (25 mg total) by mouth daily. 30 tablet 11  . Multiple Vitamins-Minerals (MULTIVITAMIN PO) Take 1 tablet by mouth daily.    . polyethylene glycol powder (MIRALAX) powder Take 255 g by mouth daily as needed.    Marland Kitchen HYDROcodone-acetaminophen (NORCO/VICODIN) 5-325 MG tablet Take 0.5 tablets by mouth 2 (two) times daily as needed for moderate pain. 30 tablet 0   No facility-administered medications prior to visit.      Per HPI unless specifically indicated in ROS section below Review of Systems Objective:    BP (!) 158/98 (BP Location: Right Arm, Cuff Size: Large)   Pulse 76   Temp 98.4 F (36.9 C) (Oral)   Ht 5' 7.5" (1.715 m)   Wt 183 lb 2 oz (83.1 kg)   SpO2 98%   BMI 28.26 kg/m   Wt Readings from Last 3 Encounters:  12/06/18 183 lb 2 oz (83.1 kg)  08/24/18 181 lb (82.1 kg)  08/09/18 175 lb 4 oz (79.5 kg)    Physical Exam Vitals signs and nursing note reviewed.  Constitutional:      Appearance: Normal appearance. He is not ill-appearing.  Abdominal:     General: Abdomen is flat. Bowel sounds are normal. There is no distension.     Palpations: Abdomen is soft. There is no mass.     Tenderness: There is no abdominal tenderness. There is no right CVA tenderness, left CVA tenderness, guarding or rebound.     Hernia: No hernia is present.  Musculoskeletal:     Right lower leg: No edema.     Left lower leg: No edema.  Neurological:     Mental Status: He is alert.     Comments: Slow movements  Psychiatric:        Mood and Affect: Mood normal.        Behavior: Behavior normal.       Results for orders placed or  performed in visit on 12/06/18  POCT Urinalysis Dipstick (Automated)  Result Value Ref Range   Color, UA yellow    Clarity, UA clear    Glucose, UA Negative Negative   Bilirubin, UA negative    Ketones, UA negative    Spec Grav, UA 1.020 1.010 - 1.025   Blood, UA negative    pH, UA 7.0 5.0 - 8.0   Protein, UA Negative Negative   Urobilinogen, UA 0.2 0.2 or 1.0 E.U./dL   Nitrite, UA negative    Leukocytes, UA Negative Negative   Assessment & Plan:   Problem List Items Addressed This Visit    Urge incontinence - Primary    Acute worsening over the last few days. No signs of infection on UA today. 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 Discussed with patient and wife. Would avoid alpha blockers and antimuscarinics at this time given PD. Discussed option of increasing mirbetriq dosing however hesitant given elevated BP reading in office today. Encouraged good hydration, avoiding bladder irritants at this time. Will monitor symptoms and they will let me know if desire to try higher myrbetriq dose.       Relevant Orders   POCT Urinalysis Dipstick (Automated) (Completed)   Parkinson disease (HCC)   HTN (hypertension)    Elevated BP readings noted today. Wife will monitor at home and call me if persistently elevated. She states home readings averaging well controlled. Would consider midodrine titration of persistently elevated readings.       Encounter for chronic pain management    [place] CSRS reviewed and appropriate.  Intermittent hydrocodone, tolerating well.       BPH (benign prostatic hyperplasia)    H/o this, stable period on finasteride. Avoid flomax due to orthostasis.       Bilateral low back pain without sciatica   Relevant Medications   HYDROcodone-acetaminophen (NORCO/VICODIN) 5-325 MG tablet       Meds ordered this encounter  Medications  . HYDROcodone-acetaminophen (NORCO/VICODIN) 5-325 MG tablet    Sig: Take 0.5 tablets by mouth 2 (two) times daily as needed  for moderate pain.    Dispense:  30 tablet    Refill:  0   Orders Placed This Encounter  Procedures  . POCT Urinalysis Dipstick (Automated)    Follow up plan: No follow-ups on file.  [name], MD

## 2018-12-17 ENCOUNTER — Other Ambulatory Visit: Payer: Self-pay | Admitting: Family Medicine

## 2019-01-08 ENCOUNTER — Other Ambulatory Visit: Payer: Self-pay | Admitting: Family Medicine

## 2019-01-26 ENCOUNTER — Encounter: Payer: Self-pay | Admitting: Family Medicine

## 2019-01-26 ENCOUNTER — Other Ambulatory Visit: Payer: Self-pay

## 2019-01-26 ENCOUNTER — Ambulatory Visit (INDEPENDENT_AMBULATORY_CARE_PROVIDER_SITE_OTHER): Payer: Medicare HMO | Admitting: Family Medicine

## 2019-01-26 VITALS — BP 146/100 | HR 79 | Temp 97.6°F | Ht 67.5 in | Wt 184.5 lb

## 2019-01-26 DIAGNOSIS — I10 Essential (primary) hypertension: Secondary | ICD-10-CM

## 2019-01-26 DIAGNOSIS — M545 Low back pain, unspecified: Secondary | ICD-10-CM

## 2019-01-26 DIAGNOSIS — I951 Orthostatic hypotension: Secondary | ICD-10-CM

## 2019-01-26 DIAGNOSIS — E559 Vitamin D deficiency, unspecified: Secondary | ICD-10-CM | POA: Diagnosis not present

## 2019-01-26 DIAGNOSIS — G20 Parkinson's disease: Secondary | ICD-10-CM | POA: Diagnosis not present

## 2019-01-26 DIAGNOSIS — N3941 Urge incontinence: Secondary | ICD-10-CM

## 2019-01-26 DIAGNOSIS — G8929 Other chronic pain: Secondary | ICD-10-CM

## 2019-01-26 DIAGNOSIS — N289 Disorder of kidney and ureter, unspecified: Secondary | ICD-10-CM | POA: Diagnosis not present

## 2019-01-26 DIAGNOSIS — E538 Deficiency of other specified B group vitamins: Secondary | ICD-10-CM | POA: Diagnosis not present

## 2019-01-26 MED ORDER — HYDROCODONE-ACETAMINOPHEN 5-325 MG PO TABS: 0.5000 | ORAL_TABLET | Freq: Two times a day (BID) | ORAL | 0 refills | Status: DC | PRN

## 2019-01-26 MED ORDER — MIDODRINE HCL 5 MG PO TABS: ORAL_TABLET | ORAL | 3 refills | Status: DC

## 2019-01-26 NOTE — Assessment & Plan Note (Signed)
BP remaining elevated although home readings better controlled per wife. No titration of midodrine at this time, wife will keep log of blood pressures and notify me if consistently >140/90s

## 2019-01-26 NOTE — Assessment & Plan Note (Signed)
[place] CSRS reviewed. Narcotic refilled.

## 2019-01-26 NOTE — Assessment & Plan Note (Signed)
Compliant with oral b12 replacement - update labs.

## 2019-01-26 NOTE — Assessment & Plan Note (Signed)
Stable period. Narcotic refilled.

## 2019-01-26 NOTE — Assessment & Plan Note (Signed)
Continue midodrine 5mg  breakfast, lunch, 2.5mg  dinner. Consider titration of BP remaining elevated.

## 2019-01-26 NOTE — Assessment & Plan Note (Signed)
Appreciate neurology care. Continue sinemet, midodrine. See below.

## 2019-01-26 NOTE — Progress Notes (Signed)
This visit was conducted in person.  BP (!) 146/100 (BP Location: Left Arm, Cuff Size: Normal)   Pulse 79   Temp 97.6 F (36.4 C) (Temporal)   Ht 5' 7.5" (1.715 m)   Wt 184 lb 8 oz (83.7 kg)   SpO2 100%   BMI 28.47 kg/m   BP Readings from Last 3 Encounters:  01/26/19 (!) 146/100  12/06/18 (!) 158/98  08/24/18 138/88    CC: 3 mo f/u visit chronic pain  Subjective:    Patient ID: Brandon Manning, male    DOB: 25-May-1946, 73 y.o.   MRN: [id number]  HPI: Brandon Manning is a 73 y.o. male presenting on 01/26/2019 for Chronic Pain Manangement (Here for 3 mo f/u.  Pt accompanied by wife, [name]. ) and Form Completion (Needs [place] [employer] form completed.)   PD with associated hypotension - on midodrine 5mg  in am and 2.5mg  in afternoon and evenings. Wife has noticed increased dependence for ADLs. Upcoming appt with Dr Tat next month.   B12 deficiency - overdue for recheck. Compliant with 1058mcg PO daily.   Chronic back pain on hydrocodone for breakthrough pain. Denies constipation, sedation, or unsteadiness. Last filled 12/06/2018. PRN miralax for constipation.   Urge incontinence/overactive bladder with BPH on myrbetriq and finasteride. Using depends diapers more regularly. Urination has been doing better since wife started monitoring med administrations.   Requests handicap placard which was provided today.   HTN - bp running 135/80 at home according to wife.      Relevant past medical, surgical, family and social history reviewed and updated as indicated. Interim medical history since our last visit reviewed. Allergies and medications reviewed and updated. Outpatient Medications Prior to Visit  Medication Sig Dispense Refill  . aspirin EC 81 MG tablet Take 81 mg by mouth every other day.    . carbidopa-levodopa (SINEMET IR) 25-100 MG tablet TAKE 2 TABLETS AT 7AM, 1 TABLET AT 12 NOON AND 1 TABLET AT 5PM 360 tablet 1  . Cyanocobalamin (B-12) 1000 MCG SUBL Place  1 tablet under the tongue daily. 30 each   . finasteride (PROSCAR) 5 MG tablet TAKE 1 TABLET EVERY DAY 90 tablet 3  . mirabegron ER (MYRBETRIQ) 25 MG TB24 tablet Take 1 tablet (25 mg total) by mouth daily. 30 tablet 11  . Multiple Vitamins-Minerals (MULTIVITAMIN PO) Take 1 tablet by mouth daily.    . polyethylene glycol powder (MIRALAX) powder Take 255 g by mouth daily as needed.    Marland Kitchen HYDROcodone-acetaminophen (NORCO/VICODIN) 5-325 MG tablet Take 0.5 tablets by mouth 2 (two) times daily as needed for moderate pain. 30 tablet 0  . midodrine (PROAMATINE) 5 MG tablet TAKE 1 TABLET BY MOUTH IN THE MORNING ,THEN  1  TABLET  AT  NOON  &  1/2  (ONE-HALF)  TABLET  AT  BEDTIME 75 tablet 0   No facility-administered medications prior to visit.      Per HPI unless specifically indicated in ROS section below Review of Systems Objective:    BP (!) 146/100 (BP Location: Left Arm, Cuff Size: Normal)   Pulse 79   Temp 97.6 F (36.4 C) (Temporal)   Ht 5' 7.5" (1.715 m)   Wt 184 lb 8 oz (83.7 kg)   SpO2 100%   BMI 28.47 kg/m   Wt Readings from Last 3 Encounters:  01/26/19 184 lb 8 oz (83.7 kg)  12/06/18 183 lb 2 oz (83.1 kg)  08/24/18 181 lb (82.1 kg)  Physical Exam Vitals signs and nursing note reviewed.  Constitutional:      Appearance: Normal appearance. He is not ill-appearing.  HENT:     Mouth/Throat:     Mouth: Mucous membranes are moist.     Pharynx: No posterior oropharyngeal erythema.  Cardiovascular:     Rate and Rhythm: Normal rate and regular rhythm.     Pulses: Normal pulses.     Heart sounds: Normal heart sounds. No murmur.  Pulmonary:     Effort: Pulmonary effort is normal. No respiratory distress.     Breath sounds: Normal breath sounds. No wheezing, rhonchi or rales.  Neurological:     Mental Status: He is alert.     Comments: Rigid movements, walks with shuffling gait with cane, masked fascies        Lab Results  Component Value Date   VITAMINB12 161 (L)  04/26/2018    Assessment & Plan:   Problem List Items Addressed This Visit    Vitamin B12 deficiency    Compliant with oral b12 replacement - update labs.       Relevant Orders   Vitamin B12   Urge incontinence    ?PD-related detrusor hyperreflexia. Continue mirbetriq. Wife plans to decrease sweets in the house to see if any effect. Will consider increased myrbetriq dose if needed.      Parkinson disease ([place]) - Primary    Appreciate neurology care. Continue sinemet, midodrine. See below.       Orthostatic hypotension    Continue midodrine 5mg  breakfast, lunch, 2.5mg  dinner. Consider titration of BP remaining elevated.       Relevant Medications   midodrine (PROAMATINE) 5 MG tablet   HTN (hypertension)    BP remaining elevated although home readings better controlled per wife. No titration of midodrine at this time, wife will keep log of blood pressures and notify me if consistently >140/90s       Relevant Medications   midodrine (PROAMATINE) 5 MG tablet   Encounter for chronic pain management    [place] CSRS reviewed. Narcotic refilled.       Bilateral low back pain without sciatica    Stable period. Narcotic refilled.       Relevant Medications   HYDROcodone-acetaminophen (NORCO/VICODIN) 5-325 MG tablet    Other Visit Diagnoses    Renal insufficiency       Relevant Orders   VITAMIN D 25 Hydroxy (Vit-D Deficiency, Fractures)   Renal function panel       Meds ordered this encounter  Medications  . midodrine (PROAMATINE) 5 MG tablet    Sig: TAKE 1 TABLET BY MOUTH IN THE MORNING ,THEN  1  TABLET  AT  NOON  &  1/2  (ONE-HALF)  TABLET  AT  BEDTIME    Dispense:  75 tablet    Refill:  3  . HYDROcodone-acetaminophen (NORCO/VICODIN) 5-325 MG tablet    Sig: Take 0.5 tablets by mouth 2 (two) times daily as needed for moderate pain.    Dispense:  30 tablet    Refill:  0   Orders Placed This Encounter  Procedures  . Vitamin B12  . VITAMIN D 25 Hydroxy (Vit-D Deficiency,  Fractures)  . Renal function panel    Follow up plan: No follow-ups on file.  [name], MD

## 2019-01-26 NOTE — Assessment & Plan Note (Signed)
?  PD-related detrusor hyperreflexia. Continue mirbetriq. Wife plans to decrease sweets in the house to see if any effect. Will consider increased myrbetriq dose if needed.

## 2019-01-26 NOTE — Patient Instructions (Signed)
You are doing well today. Continue current medicines. Continue monitoring blood pressures, let me know if consistently >140/90s as we may back down on midodrine dosing.  Medicines refilled today. B12 check today.

## 2019-01-27 ENCOUNTER — Other Ambulatory Visit: Payer: Self-pay | Admitting: Family Medicine

## 2019-01-27 LAB — VITAMIN B12: Vitamin B-12: 352 pg/mL (ref 200–1100)

## 2019-01-27 LAB — RENAL FUNCTION PANEL
Albumin: 3.9 g/dL (ref 3.6–5.1)
BUN: 17 mg/dL (ref 7–25)
CO2: 28 mmol/L (ref 20–32)
Calcium: 9.4 mg/dL (ref 8.6–10.3)
Chloride: 107 mmol/L (ref 98–110)
Creat: 1.08 mg/dL (ref 0.70–1.18)
Glucose, Bld: 125 mg/dL — ABNORMAL HIGH (ref 65–99)
Phosphorus: 2.4 mg/dL (ref 2.1–4.3)
Potassium: 3.8 mmol/L (ref 3.5–5.3)
Sodium: 142 mmol/L (ref 135–146)

## 2019-01-27 LAB — VITAMIN D 25 HYDROXY (VIT D DEFICIENCY, FRACTURES): Vit D, 25-Hydroxy: 13 ng/mL — ABNORMAL LOW (ref 30–100)

## 2019-01-27 MED ORDER — VITAMIN D 50 MCG (2000 UT) PO CAPS: 1.0000 | ORAL_CAPSULE | Freq: Every day | ORAL | Status: DC

## 2019-02-02 ENCOUNTER — Encounter: Payer: Self-pay | Admitting: Family Medicine

## 2019-02-02 DIAGNOSIS — E559 Vitamin D deficiency, unspecified: Secondary | ICD-10-CM | POA: Insufficient documentation

## 2019-02-23 ENCOUNTER — Other Ambulatory Visit: Payer: Self-pay | Admitting: Neurology

## 2019-02-23 DIAGNOSIS — G20 Parkinson's disease: Secondary | ICD-10-CM

## 2019-02-23 NOTE — Telephone Encounter (Signed)
Requested Prescriptions   Pending Prescriptions Disp Refills  . carbidopa-levodopa (SINEMET IR) 25-100 MG tablet [Pharmacy Med Name: CARBIDOPA/LEVODOPA 25-100 MG Tablet] 360 tablet 1    Sig: TAKE 2 TABLETS AT 7AM, 1 TABLET AT 12 NOON AND 1 TABLET AT 5PM   Rx last filled:10/24/18 #360 1 refills  Pt last seen:08/24/18  Follow up appt scheduled:02/27/19
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
## 2019-02-27 ENCOUNTER — Other Ambulatory Visit: Payer: Self-pay

## 2019-02-27 ENCOUNTER — Encounter: Payer: Self-pay | Admitting: Neurology

## 2019-02-27 ENCOUNTER — Ambulatory Visit (INDEPENDENT_AMBULATORY_CARE_PROVIDER_SITE_OTHER): Payer: Medicare HMO | Admitting: Neurology

## 2019-02-27 VITALS — BP 152/90 | HR 81 | Temp 98.6°F | Ht 68.0 in | Wt 183.0 lb

## 2019-02-27 DIAGNOSIS — G20 Parkinson's disease: Secondary | ICD-10-CM | POA: Diagnosis not present

## 2019-02-27 DIAGNOSIS — G903 Multi-system degeneration of the autonomic nervous system: Secondary | ICD-10-CM | POA: Diagnosis not present

## 2019-02-27 DIAGNOSIS — F0280 Dementia in other diseases classified elsewhere without behavioral disturbance: Secondary | ICD-10-CM | POA: Diagnosis not present

## 2019-02-27 MED ORDER — CARBIDOPA-LEVODOPA 25-100 MG PO TABS: ORAL_TABLET | ORAL | 2 refills | Status: DC

## 2019-02-27 NOTE — Patient Instructions (Addendum)
Increase carbidopa/levodopa 25/100, 2 at 7am/2 at noon, 1 at 4-5 pm  The physicians and staff at [facility] are committed to providing excellent care. You may receive a survey requesting feedback about your experience at our office. We strive to receive "very good" responses to the survey questions. If you feel that your experience would prevent you from giving the office a "very good " response, please contact our office to try to remedy the situation. We may be reached at [phone]. Thank you for taking the time out of your busy day to complete the survey.

## 2019-04-13 ENCOUNTER — Telehealth: Payer: Self-pay

## 2019-04-13 NOTE — Telephone Encounter (Signed)
Noted  

## 2019-04-13 NOTE — Telephone Encounter (Signed)
Mrs [facility] signed) said that for few days pt has had swelling in his feet; no lower leg swelling, no SOB, no BP or H/A. Pt used to take fluid pill but it was stopped. Pt swelling goes down overnight; wife is elevating his feet when sitting or laying. Pt has no covid symptoms, no travel and no known exposure to + covid. Pt wants in office appt next wk. in office appt 04/16/19 at 11:15; pt will be here at 11:00. ED precautions given and pts wife voiced understanding.

## 2019-04-16 ENCOUNTER — Other Ambulatory Visit: Payer: Self-pay

## 2019-04-16 ENCOUNTER — Ambulatory Visit (INDEPENDENT_AMBULATORY_CARE_PROVIDER_SITE_OTHER): Payer: Medicare HMO | Admitting: Family Medicine

## 2019-04-16 ENCOUNTER — Encounter: Payer: Self-pay | Admitting: Family Medicine

## 2019-04-16 VITALS — BP 160/110 | HR 84 | Temp 98.0°F | Resp 16 | Ht 67.5 in | Wt 184.4 lb

## 2019-04-16 DIAGNOSIS — I10 Essential (primary) hypertension: Secondary | ICD-10-CM

## 2019-04-16 DIAGNOSIS — R600 Localized edema: Secondary | ICD-10-CM | POA: Insufficient documentation

## 2019-04-16 DIAGNOSIS — G20 Parkinson's disease: Secondary | ICD-10-CM | POA: Diagnosis not present

## 2019-04-16 MED ORDER — HYDROCHLOROTHIAZIDE 12.5 MG PO CAPS: 12.5000 mg | ORAL_CAPSULE | Freq: Every day | ORAL | 3 refills | Status: DC | PRN

## 2019-04-16 NOTE — Assessment & Plan Note (Signed)
Monitor associated orthostasis on lower midodrine dose.

## 2019-04-16 NOTE — Assessment & Plan Note (Signed)
New over last few days, without associated change in sodium intake identified. Supportive care reviewed - increased water intake, leg elevation. ?midodrine related BP elevation leading to new pedal edema - will decrease dose to 2.5mg  TID AC. Will provide with hctz 12.5mg  to use PRN pedal edema. Update labs at 2 wk CPE.

## 2019-04-16 NOTE — Patient Instructions (Addendum)
Feet are staying more swollen than they should. Blood pressure was also elevated again today.  Limit salt/sodium in the diet, make sure to drink plenty of water.  Decrease midodrine to 1/2 tablet (2.5mg ) three times a day.  Restart hydrochlorothiazide 12.5mg  daily as needed for foot/ankle swelling in the morning.  Bring in BP cuff to next visit to compare.

## 2019-04-16 NOTE — Assessment & Plan Note (Addendum)
BP remaining high. Will decrease midodrine to 2.5 mg TID AC. Add low dose HCTZ PRN pedal edema. Reassess at CPE in 2 wks. Monitor BP on myrbetriq which could also raise blood pressures.

## 2019-04-16 NOTE — Progress Notes (Signed)
This visit was conducted in person.  BP (!) 160/110 (BP Location: Right Arm, Cuff Size: Large)   Pulse 84   Temp 98 F (36.7 C)   Resp 16   Ht 5' 7.5" (1.715 m)   Wt 184 lb 6 oz (83.6 kg)   SpO2 95%   BMI 28.45 kg/m    CC: bilat foot swelling for several days Subjective:    Patient ID: Brandon Manning, male    DOB: 12-May-1946, 73 y.o.   MRN: [id number]  HPI: Brandon Manning is a 73 y.o. male presenting on 04/16/2019 for Foot Swelling (x 2 days. Both feet)   Here with wife.  3-4d h/o bilateral lower ankle and foot swelling without pain or redness or warmth. Wife has been elevating legs - with benefit. Denies dyspnea, chest pain. No increased salt/sodium intake (did have part of pork sandwich last week but didn't eat the entire sandwich).   H/o permissive hypertension in setting of parkinsons disease with orthostasis. He is also on midodrine 5mg  twice daily with extra 1/2 tab at bedtime.   Previously on HCTZ but stopped due to hypotension.        Relevant past medical, surgical, family and social history reviewed and updated as indicated. Interim medical history since our last visit reviewed. Allergies and medications reviewed and updated. Outpatient Medications Prior to Visit  Medication Sig Dispense Refill  . aspirin EC 81 MG tablet Take 81 mg by mouth every other day.    . carbidopa-levodopa (SINEMET IR) 25-100 MG tablet 2 in the 7am, 2 at noon, 1 at 4pm 450 tablet 2  . Cholecalciferol (VITAMIN D) 50 MCG (2000 UT) CAPS Take 1 capsule (2,000 Units total) by mouth daily. 30 capsule   . Cyanocobalamin (B-12) 1000 MCG SUBL Place 1 tablet under the tongue daily. 30 each   . finasteride (PROSCAR) 5 MG tablet TAKE 1 TABLET EVERY DAY 90 tablet 3  . HYDROcodone-acetaminophen (NORCO/VICODIN) 5-325 MG tablet Take 0.5 tablets by mouth 2 (two) times daily as needed for moderate pain. 30 tablet 0  . midodrine (PROAMATINE) 5 MG tablet Take 0.5 tablets (2.5 mg total) by mouth 3 (three)  times daily with meals.    . mirabegron ER (MYRBETRIQ) 25 MG TB24 tablet Take 1 tablet (25 mg total) by mouth daily. 30 tablet 11  . Multiple Vitamins-Minerals (MULTIVITAMIN PO) Take 1 tablet by mouth daily.    . polyethylene glycol powder (MIRALAX) powder Take 255 g by mouth daily as needed.    . midodrine (PROAMATINE) 5 MG tablet TAKE 1 TABLET BY MOUTH IN THE MORNING ,THEN  1  TABLET  AT  NOON  &  1/2  (ONE-HALF)  TABLET  AT  BEDTIME 75 tablet 3   No facility-administered medications prior to visit.      Per HPI unless specifically indicated in ROS section below Review of Systems Objective:    BP (!) 160/110 (BP Location: Right Arm, Cuff Size: Large)   Pulse 84   Temp 98 F (36.7 C)   Resp 16   Ht 5' 7.5" (1.715 m)   Wt 184 lb 6 oz (83.6 kg)   SpO2 95%   BMI 28.45 kg/m   Wt Readings from Last 3 Encounters:  04/16/19 184 lb 6 oz (83.6 kg)  02/27/19 183 lb (83 kg)  01/26/19 184 lb 8 oz (83.7 kg)    Physical Exam Vitals signs and nursing note reviewed.  Constitutional:      General: He  is not in acute distress.    Appearance: Normal appearance. He is not ill-appearing.  Cardiovascular:     Rate and Rhythm: Normal rate and regular rhythm.     Pulses: Normal pulses.     Heart sounds: Normal heart sounds. No murmur.  Pulmonary:     Effort: Pulmonary effort is normal. No respiratory distress.     Breath sounds: Normal breath sounds. No wheezing, rhonchi or rales.  Musculoskeletal:        General: Swelling present.     Right lower leg: Edema present.     Left lower leg: Edema present.     Comments: 2+ pitting edema bilateral ankles into dorsal feet   Neurological:     Mental Status: He is alert.     Comments: Masked facies       Results for orders placed or performed in visit on 01/26/19  Vitamin B12  Result Value Ref Range   Vitamin B-12 352 200 - 1,100 pg/mL  VITAMIN D 25 Hydroxy (Vit-D Deficiency, Fractures)  Result Value Ref Range   Vit D, 25-Hydroxy 13 (L) 30 -  100 ng/mL  Renal function panel  Result Value Ref Range   Glucose, Bld 125 (H) 65 - 99 mg/dL   BUN 17 7 - 25 mg/dL   Creat 1.08 0.70 - 1.18 mg/dL   BUN/Creatinine Ratio NOT APPLICABLE 6 - 22 (calc)   Sodium 142 135 - 146 mmol/L   Potassium 3.8 3.5 - 5.3 mmol/L   Chloride 107 98 - 110 mmol/L   CO2 28 20 - 32 mmol/L   Calcium 9.4 8.6 - 10.3 mg/dL   Phosphorus 2.4 2.1 - 4.3 mg/dL   Albumin 3.9 3.6 - 5.1 g/dL   Assessment & Plan:   Problem List Items Addressed This Visit    Pedal edema - Primary    New over last few days, without associated change in sodium intake identified. Supportive care reviewed - increased water intake, leg elevation. ?midodrine related BP elevation leading to new pedal edema - will decrease dose to 2.5mg  TID AC. Will provide with hctz 12.5mg  to use PRN pedal edema. Update labs at 2 wk CPE.       Parkinson disease ([place])    Monitor associated orthostasis on lower midodrine dose.       HTN (hypertension)    BP remaining high. Will decrease midodrine to 2.5 mg TID AC. Add low dose HCTZ PRN pedal edema. Reassess at CPE in 2 wks. Monitor BP on myrbetriq which could also raise blood pressures.       Relevant Medications   midodrine (PROAMATINE) 5 MG tablet   hydrochlorothiazide (MICROZIDE) 12.5 MG capsule       Meds ordered this encounter  Medications  . hydrochlorothiazide (MICROZIDE) 12.5 MG capsule    Sig: Take 1 capsule (12.5 mg total) by mouth daily as needed (foot swelling).    Dispense:  30 capsule    Refill:  3   No orders of the defined types were placed in this encounter.  Patient Instructions  Feet are staying more swollen than they should. Blood pressure was also elevated again today.  Limit salt/sodium in the diet, make sure to drink plenty of water.  Decrease midodrine to 1/2 tablet (2.5mg ) three times a day.  Restart hydrochlorothiazide 12.5mg  daily as needed for foot/ankle swelling in the morning.  Bring in BP cuff to next visit to  compare.    Follow up plan: Return if symptoms worsen or fail  to improve.  [name], MD

## 2019-05-02 ENCOUNTER — Other Ambulatory Visit: Payer: Self-pay | Admitting: Family Medicine

## 2019-05-02 DIAGNOSIS — E559 Vitamin D deficiency, unspecified: Secondary | ICD-10-CM

## 2019-05-02 DIAGNOSIS — E538 Deficiency of other specified B group vitamins: Secondary | ICD-10-CM

## 2019-05-02 DIAGNOSIS — I10 Essential (primary) hypertension: Secondary | ICD-10-CM

## 2019-05-03 ENCOUNTER — Telehealth: Payer: Self-pay

## 2019-05-03 ENCOUNTER — Other Ambulatory Visit (INDEPENDENT_AMBULATORY_CARE_PROVIDER_SITE_OTHER): Payer: Medicare HMO

## 2019-05-03 ENCOUNTER — Ambulatory Visit: Payer: Medicare HMO

## 2019-05-03 ENCOUNTER — Other Ambulatory Visit: Payer: Self-pay

## 2019-05-03 DIAGNOSIS — E538 Deficiency of other specified B group vitamins: Secondary | ICD-10-CM

## 2019-05-03 DIAGNOSIS — E559 Vitamin D deficiency, unspecified: Secondary | ICD-10-CM | POA: Diagnosis not present

## 2019-05-03 DIAGNOSIS — I10 Essential (primary) hypertension: Secondary | ICD-10-CM

## 2019-05-03 LAB — CBC WITH DIFFERENTIAL/PLATELET
Basophils Absolute: 0 10*3/uL (ref 0.0–0.1)
Basophils Relative: 0.1 % (ref 0.0–3.0)
Eosinophils Absolute: 0.1 10*3/uL (ref 0.0–0.7)
Eosinophils Relative: 1 % (ref 0.0–5.0)
HCT: 38.2 % — ABNORMAL LOW (ref 39.0–52.0)
Hemoglobin: 12.3 g/dL — ABNORMAL LOW (ref 13.0–17.0)
Lymphocytes Relative: 27.5 % (ref 12.0–46.0)
Lymphs Abs: 2.2 10*3/uL (ref 0.7–4.0)
MCHC: 32.1 g/dL (ref 30.0–36.0)
MCV: 84.8 fl (ref 78.0–100.0)
Monocytes Absolute: 0.7 10*3/uL (ref 0.1–1.0)
Monocytes Relative: 8.3 % (ref 3.0–12.0)
Neutro Abs: 5.1 10*3/uL (ref 1.4–7.7)
Neutrophils Relative %: 63.1 % (ref 43.0–77.0)
Platelets: 292 10*3/uL (ref 150.0–400.0)
RBC: 4.51 Mil/uL (ref 4.22–5.81)
RDW: 15 % (ref 11.5–15.5)
WBC: 8.1 10*3/uL (ref 4.0–10.5)

## 2019-05-03 LAB — LIPID PANEL
Cholesterol: 174 mg/dL (ref 0–200)
HDL: 48.8 mg/dL (ref >39.00)
LDL Cholesterol: 102 mg/dL — ABNORMAL HIGH (ref 0–99)
NonHDL: 125.58
Total CHOL/HDL Ratio: 4
Triglycerides: 118 mg/dL (ref 0.0–149.0)
VLDL: 23.6 mg/dL (ref 0.0–40.0)

## 2019-05-03 LAB — COMPREHENSIVE METABOLIC PANEL
ALT: 9 U/L (ref 0–53)
AST: 12 U/L (ref 0–37)
Albumin: 4 g/dL (ref 3.5–5.2)
Alkaline Phosphatase: 49 U/L (ref 39–117)
BUN: 24 mg/dL — ABNORMAL HIGH (ref 6–23)
CO2: 28 mEq/L (ref 19–32)
Calcium: 9.5 mg/dL (ref 8.4–10.5)
Chloride: 102 mEq/L (ref 96–112)
Creatinine, Ser: 1.45 mg/dL (ref 0.40–1.50)
GFR: 57.35 mL/min — ABNORMAL LOW (ref >60.00)
Glucose, Bld: 124 mg/dL — ABNORMAL HIGH (ref 70–99)
Potassium: 3.2 mEq/L — ABNORMAL LOW (ref 3.5–5.1)
Sodium: 140 mEq/L (ref 135–145)
Total Bilirubin: 0.7 mg/dL (ref 0.2–1.2)
Total Protein: 7.5 g/dL (ref 6.0–8.3)

## 2019-05-03 LAB — TSH: TSH: 2 u[IU]/mL (ref 0.35–4.50)

## 2019-05-03 LAB — VITAMIN D 25 HYDROXY (VIT D DEFICIENCY, FRACTURES): VITD: 20.06 ng/mL — ABNORMAL LOW (ref 30.00–100.00)

## 2019-05-03 LAB — VITAMIN B12: Vitamin B-12: >1500 pg/mL — ABNORMAL HIGH (ref 211–911)

## 2019-05-03 NOTE — Addendum Note (Signed)
Addended by: Cloyd Stagers on: 05/03/2019 08:32 AM   Modules accepted: Orders

## 2019-05-03 NOTE — Telephone Encounter (Signed)
Called patient a total of four times trying to complete his Medicare Wellness visit. Never received an answer and no answering machine was available. Appointment was cancelled and will be completed by provider.

## 2019-05-04 ENCOUNTER — Other Ambulatory Visit: Payer: Self-pay | Admitting: Family Medicine

## 2019-05-04 MED ORDER — POTASSIUM CHLORIDE ER 10 MEQ PO TBCR: 10.0000 meq | EXTENDED_RELEASE_TABLET | Freq: Every day | ORAL | 3 refills | Status: DC | PRN

## 2019-05-13 ENCOUNTER — Emergency Department: Payer: Medicare HMO

## 2019-05-13 ENCOUNTER — Other Ambulatory Visit: Payer: Self-pay

## 2019-05-13 ENCOUNTER — Inpatient Hospital Stay
Admission: EM | Admit: 2019-05-13 | Discharge: 2019-05-16 | DRG: 057 | Disposition: A | Discharge status: Home Health | Payer: Medicare HMO | Attending: Internal Medicine | Admitting: Internal Medicine

## 2019-05-13 DIAGNOSIS — G20 Parkinson's disease: Secondary | ICD-10-CM | POA: Diagnosis not present

## 2019-05-13 DIAGNOSIS — E559 Vitamin D deficiency, unspecified: Secondary | ICD-10-CM | POA: Diagnosis present

## 2019-05-13 DIAGNOSIS — Z8249 Family history of ischemic heart disease and other diseases of the circulatory system: Secondary | ICD-10-CM | POA: Diagnosis not present

## 2019-05-13 DIAGNOSIS — Z20828 Contact with and (suspected) exposure to other viral communicable diseases: Secondary | ICD-10-CM | POA: Diagnosis not present

## 2019-05-13 DIAGNOSIS — R0902 Hypoxemia: Secondary | ICD-10-CM | POA: Diagnosis not present

## 2019-05-13 DIAGNOSIS — Z8261 Family history of arthritis: Secondary | ICD-10-CM

## 2019-05-13 DIAGNOSIS — Z87891 Personal history of nicotine dependence: Secondary | ICD-10-CM | POA: Diagnosis not present

## 2019-05-13 DIAGNOSIS — I10 Essential (primary) hypertension: Secondary | ICD-10-CM | POA: Diagnosis not present

## 2019-05-13 DIAGNOSIS — R55 Syncope and collapse: Secondary | ICD-10-CM | POA: Diagnosis not present

## 2019-05-13 DIAGNOSIS — E860 Dehydration: Secondary | ICD-10-CM | POA: Diagnosis present

## 2019-05-13 DIAGNOSIS — E876 Hypokalemia: Secondary | ICD-10-CM | POA: Diagnosis not present

## 2019-05-13 DIAGNOSIS — Z833 Family history of diabetes mellitus: Secondary | ICD-10-CM | POA: Diagnosis not present

## 2019-05-13 DIAGNOSIS — G903 Multi-system degeneration of the autonomic nervous system: Principal | ICD-10-CM | POA: Diagnosis present

## 2019-05-13 DIAGNOSIS — Z79899 Other long term (current) drug therapy: Secondary | ICD-10-CM

## 2019-05-13 DIAGNOSIS — E8809 Other disorders of plasma-protein metabolism, not elsewhere classified: Secondary | ICD-10-CM | POA: Diagnosis present

## 2019-05-13 DIAGNOSIS — R001 Bradycardia, unspecified: Secondary | ICD-10-CM | POA: Diagnosis not present

## 2019-05-13 DIAGNOSIS — I959 Hypotension, unspecified: Secondary | ICD-10-CM | POA: Diagnosis not present

## 2019-05-13 DIAGNOSIS — Z880 Allergy status to penicillin: Secondary | ICD-10-CM

## 2019-05-13 DIAGNOSIS — F0280 Dementia in other diseases classified elsewhere without behavioral disturbance: Secondary | ICD-10-CM | POA: Diagnosis present

## 2019-05-13 DIAGNOSIS — T502X5A Adverse effect of carbonic-anhydrase inhibitors, benzothiadiazides and other diuretics, initial encounter: Secondary | ICD-10-CM | POA: Diagnosis present

## 2019-05-13 DIAGNOSIS — Z7982 Long term (current) use of aspirin: Secondary | ICD-10-CM | POA: Diagnosis not present

## 2019-05-13 DIAGNOSIS — Z841 Family history of disorders of kidney and ureter: Secondary | ICD-10-CM

## 2019-05-13 DIAGNOSIS — N179 Acute kidney failure, unspecified: Secondary | ICD-10-CM | POA: Diagnosis not present

## 2019-05-13 DIAGNOSIS — N400 Enlarged prostate without lower urinary tract symptoms: Secondary | ICD-10-CM | POA: Diagnosis not present

## 2019-05-13 DIAGNOSIS — R42 Dizziness and giddiness: Secondary | ICD-10-CM | POA: Diagnosis not present

## 2019-05-13 LAB — CBC
HCT: 39.5 % (ref 39.0–52.0)
Hemoglobin: 12.2 g/dL — ABNORMAL LOW (ref 13.0–17.0)
MCH: 27.2 pg (ref 26.0–34.0)
MCHC: 30.9 g/dL (ref 30.0–36.0)
MCV: 88.2 fL (ref 80.0–100.0)
Platelets: 261 10*3/uL (ref 150–400)
RBC: 4.48 MIL/uL (ref 4.22–5.81)
RDW: 14.8 % (ref 11.5–15.5)
WBC: 10.1 10*3/uL (ref 4.0–10.5)
nRBC: 0 % (ref 0.0–0.2)

## 2019-05-13 LAB — COMPREHENSIVE METABOLIC PANEL
ALT: <5 U/L (ref 0–44)
AST: 16 U/L (ref 15–41)
Albumin: 2.6 g/dL — ABNORMAL LOW (ref 3.5–5.0)
Alkaline Phosphatase: 32 U/L — ABNORMAL LOW (ref 38–126)
Anion gap: 8 (ref 5–15)
BUN: 19 mg/dL (ref 8–23)
CO2: 21 mmol/L — ABNORMAL LOW (ref 22–32)
Calcium: 6.2 mg/dL — CL (ref 8.9–10.3)
Chloride: 116 mmol/L — ABNORMAL HIGH (ref 98–111)
Creatinine, Ser: 0.95 mg/dL (ref 0.61–1.24)
GFR calc Af Amer: >60 mL/min (ref >60)
GFR calc non Af Amer: >60 mL/min (ref >60)
Glucose, Bld: 89 mg/dL (ref 70–99)
Potassium: 3 mmol/L — ABNORMAL LOW (ref 3.5–5.1)
Sodium: 145 mmol/L (ref 135–145)
Total Bilirubin: 0.8 mg/dL (ref 0.3–1.2)
Total Protein: 5.3 g/dL — ABNORMAL LOW (ref 6.5–8.1)

## 2019-05-13 LAB — URINALYSIS, COMPLETE (UACMP) WITH MICROSCOPIC
Bacteria, UA: NONE SEEN
Bilirubin Urine: NEGATIVE
Glucose, UA: NEGATIVE mg/dL
Ketones, ur: 5 mg/dL — AB
Leukocytes,Ua: NEGATIVE
Nitrite: NEGATIVE
Protein, ur: 100 mg/dL — AB
Specific Gravity, Urine: 1.025 (ref 1.005–1.030)
pH: 5 (ref 5.0–8.0)

## 2019-05-13 LAB — TROPONIN I (HIGH SENSITIVITY): Troponin I (High Sensitivity): 6 ng/L (ref <18)

## 2019-05-13 MED ORDER — ENOXAPARIN SODIUM 40 MG/0.4ML ~~LOC~~ SOLN
40.0000 mg | SUBCUTANEOUS | Status: DC
  Administered 2019-05-14: 40 mg via SUBCUTANEOUS
  Filled 2019-05-13 (×2): qty 0.4

## 2019-05-13 MED ORDER — SODIUM CHLORIDE 0.9% FLUSH
3.0000 mL | Freq: Two times a day (BID) | INTRAVENOUS | Status: DC
  Administered 2019-05-14 – 2019-05-15 (×3): 3 mL via INTRAVENOUS

## 2019-05-13 MED ORDER — SODIUM CHLORIDE 0.9 % IV BOLUS
500.0000 mL | Freq: Once | INTRAVENOUS | Status: AC
  Administered 2019-05-13: 500 mL via INTRAVENOUS

## 2019-05-13 MED ORDER — SODIUM CHLORIDE 0.9 % IV SOLN
INTRAVENOUS | Status: DC
  Administered 2019-05-14 – 2019-05-15 (×3): via INTRAVENOUS

## 2019-05-13 NOTE — ED Notes (Signed)
Called lab informed them that orders are now placed for pt and to please run specimens.

## 2019-05-13 NOTE — ED Triage Notes (Addendum)
Pt comes via [facility] from home with c/o weakness and dizziness. EMS reports pt got dizzy at home.  EMS reports pt recently started new medication for his Parkinson's and pt was feeling funny so family informed PCP. Pt taken off of medications 2 days ago.  Upon arrival EMS stated BP-95/43 after sitting pt down. Later BP-150/80 after sitting for a few.  Pt states he gets dizzy when he stands up and falls. NO LOC reported. Pt denies any pain.  Pt is A&OX4

## 2019-05-13 NOTE — ED Notes (Signed)
Patient transported to CT 
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
## 2019-05-13 NOTE — ED Notes (Signed)
Pt dry at this time. Pt informed to notify RN when able to provide urine specimen
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
## 2019-05-14 ENCOUNTER — Telehealth: Payer: Self-pay | Admitting: Family Medicine

## 2019-05-14 ENCOUNTER — Encounter: Payer: Medicare HMO | Admitting: Family Medicine

## 2019-05-14 LAB — COMPREHENSIVE METABOLIC PANEL
ALT: 10 U/L (ref 0–44)
AST: 14 U/L — ABNORMAL LOW (ref 15–41)
Albumin: 3.3 g/dL — ABNORMAL LOW (ref 3.5–5.0)
Alkaline Phosphatase: 49 U/L (ref 38–126)
Anion gap: 8 (ref 5–15)
BUN: 24 mg/dL — ABNORMAL HIGH (ref 8–23)
CO2: 26 mmol/L (ref 22–32)
Calcium: 8.8 mg/dL — ABNORMAL LOW (ref 8.9–10.3)
Chloride: 109 mmol/L (ref 98–111)
Creatinine, Ser: 1.28 mg/dL — ABNORMAL HIGH (ref 0.61–1.24)
GFR calc Af Amer: >60 mL/min (ref >60)
GFR calc non Af Amer: 54 mL/min — ABNORMAL LOW (ref >60)
Glucose, Bld: 126 mg/dL — ABNORMAL HIGH (ref 70–99)
Potassium: 3.7 mmol/L (ref 3.5–5.1)
Sodium: 143 mmol/L (ref 135–145)
Total Bilirubin: 1.3 mg/dL — ABNORMAL HIGH (ref 0.3–1.2)
Total Protein: 7 g/dL (ref 6.5–8.1)

## 2019-05-14 LAB — SARS CORONAVIRUS 2 (TAT 6-24 HRS): SARS Coronavirus 2: NEGATIVE

## 2019-05-14 LAB — VITAMIN D 25 HYDROXY (VIT D DEFICIENCY, FRACTURES): Vit D, 25-Hydroxy: 19.79 ng/mL — ABNORMAL LOW (ref 30–100)

## 2019-05-14 LAB — GLUCOSE, CAPILLARY: Glucose-Capillary: 143 mg/dL — ABNORMAL HIGH (ref 70–99)

## 2019-05-14 LAB — TSH: TSH: 1.32 u[IU]/mL (ref 0.350–4.500)

## 2019-05-14 LAB — MAGNESIUM: Magnesium: 1.9 mg/dL (ref 1.7–2.4)

## 2019-05-14 MED ORDER — MIDODRINE HCL 5 MG PO TABS: 2.5000 mg | ORAL_TABLET | Freq: Three times a day (TID) | ORAL | Status: DC

## 2019-05-14 MED ORDER — MIRABEGRON ER 25 MG PO TB24
25.0000 mg | ORAL_TABLET | Freq: Every day | ORAL | Status: DC
  Administered 2019-05-14 – 2019-05-16 (×3): 25 mg via ORAL
  Filled 2019-05-14 (×3): qty 1

## 2019-05-14 MED ORDER — METOPROLOL TARTRATE 25 MG PO TABS
25.0000 mg | ORAL_TABLET | Freq: Two times a day (BID) | ORAL | Status: DC
  Administered 2019-05-14: 25 mg via ORAL
  Filled 2019-05-14: qty 1

## 2019-05-14 MED ORDER — CARBIDOPA-LEVODOPA 25-100 MG PO TABS
1.0000 | ORAL_TABLET | Freq: Every day | ORAL | Status: DC
  Administered 2019-05-14 – 2019-05-15 (×2): 1 via ORAL
  Filled 2019-05-14 (×4): qty 1

## 2019-05-14 MED ORDER — POTASSIUM CHLORIDE 10 MEQ/100ML IV SOLN
10.0000 meq | INTRAVENOUS | Status: AC
  Administered 2019-05-14 (×4): 10 meq via INTRAVENOUS
  Filled 2019-05-14 (×4): qty 100

## 2019-05-14 MED ORDER — VITAMIN B-12 1000 MCG PO TABS
1000.0000 ug | ORAL_TABLET | Freq: Every day | ORAL | Status: DC
  Administered 2019-05-14 – 2019-05-16 (×3): 1000 ug via ORAL
  Filled 2019-05-14 (×3): qty 1

## 2019-05-14 MED ORDER — CARBIDOPA-LEVODOPA 25-100 MG PO TABS: 2.0000 | ORAL_TABLET | Freq: Every morning | ORAL | Status: DC

## 2019-05-14 MED ORDER — METOPROLOL TARTRATE 25 MG PO TABS
12.5000 mg | ORAL_TABLET | Freq: Two times a day (BID) | ORAL | Status: DC
  Administered 2019-05-14 – 2019-05-15 (×2): 12.5 mg via ORAL
  Filled 2019-05-14 (×2): qty 1

## 2019-05-14 MED ORDER — VITAMIN D (ERGOCALCIFEROL) 1.25 MG (50000 UNIT) PO CAPS
50000.0000 [IU] | ORAL_CAPSULE | ORAL | Status: DC
  Administered 2019-05-14: 50000 [IU] via ORAL
  Filled 2019-05-14: qty 1

## 2019-05-14 MED ORDER — ENOXAPARIN SODIUM 40 MG/0.4ML ~~LOC~~ SOLN
40.0000 mg | SUBCUTANEOUS | Status: DC
  Administered 2019-05-14 – 2019-05-15 (×2): 40 mg via SUBCUTANEOUS
  Filled 2019-05-14 (×2): qty 0.4

## 2019-05-14 MED ORDER — ENOXAPARIN SODIUM 30 MG/0.3ML ~~LOC~~ SOLN: 30.0000 mg | SUBCUTANEOUS | Status: DC

## 2019-05-14 MED ORDER — FINASTERIDE 5 MG PO TABS
5.0000 mg | ORAL_TABLET | Freq: Every day | ORAL | Status: DC
  Administered 2019-05-14 – 2019-05-16 (×3): 5 mg via ORAL
  Filled 2019-05-14 (×3): qty 1

## 2019-05-14 MED ORDER — MIDODRINE HCL 5 MG PO TABS
2.5000 mg | ORAL_TABLET | Freq: Three times a day (TID) | ORAL | Status: DC
  Administered 2019-05-14 – 2019-05-15 (×4): 2.5 mg via ORAL
  Filled 2019-05-14 (×5): qty 1

## 2019-05-14 MED ORDER — VITAMIN D 25 MCG (1000 UNIT) PO TABS
1000.0000 [IU] | ORAL_TABLET | Freq: Every day | ORAL | Status: DC
  Administered 2019-05-14 – 2019-05-16 (×3): 1000 [IU] via ORAL
  Filled 2019-05-14 (×3): qty 1

## 2019-05-14 MED ORDER — CARBIDOPA-LEVODOPA 25-100 MG PO TABS
2.0000 | ORAL_TABLET | Freq: Two times a day (BID) | ORAL | Status: DC
  Administered 2019-05-14 – 2019-05-16 (×5): 2 via ORAL
  Filled 2019-05-14 (×6): qty 2
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
## 2019-05-14 NOTE — Evaluation (Signed)
Occupational Therapy Evaluation Patient Details Name: Brandon Manning MRN: [id number] DOB: [birth year] Today's Date: 05/14/2019    History of Present Illness presented to ER after dizziness and lightheadedness, difficulty arousing and low BP, upon EMS arrival BP 95/43 in sitting, admitted for management of orthostatic hypotension. PMH includes PD, parkinsons related dementia, neurogenic orthostatic hypotension   Clinical Impression   Brandon Manning was seen for OT evaluation this date. Prior to hospital admission, pt received assistance from his wife for IADL and some ADL management. Per pt wife who was present throughout this evaluation, Brandon Manning requires some assistance with bathing but otherwise is able dress himself, feed himself, and manage toileting. He enjoys watching television and doing his daily exercises out on his patio.  He is typically oriented to self, place, and situation, but has difficulty with time. Currently pt demonstrates impairments in strength, endurance, activity tolerance, and safety awareness requiring moderate assist for ADL management and functional mobility.  Pt would benefit from skilled OT to address noted impairments and functional limitations (see below for any additional details) in order to maximize safety and independence while minimizing falls risk and caregiver burden.  Upon hospital discharge, Brandon Manning to maximize pt safety and return to PLOF during meaningful occupations of daily life.     Follow Up Recommendations  Supervision/Assistance - 24 hour;Home health OT    Equipment Recommendations  3 in 1 bedside commode    Recommendations for Other Services       Precautions / Restrictions Precautions Precautions: Fall Precaution Comments: High Fall Restrictions Weight Bearing Restrictions: No      Mobility Bed Mobility Overal bed mobility: Needs Assistance Bed Mobility: Supine to Sit;Sit to Supine     Supine to sit: Mod assist;HOB elevated Sit to  supine: Min assist;HOB elevated   General bed mobility comments: Deferred for pt comfort/safety. Per physical therapist, pt req. moderate assist for sup>sit and min assist for sit>sup.  Transfers Overall transfer level: Needs assistance Equipment used: Rolling walker (2 wheeled) Transfers: Sit to/from Stand Sit to Stand: Mod assist;Min assist;From elevated surface         General transfer comment: x4 trials, mod A to rise from low bed also requiring vc for hand placement, min A to rise from elevated bed x2 trials, min A to rise from low toilet with cuing for hand placement    Balance Overall balance assessment: Needs assistance;Mild deficits observed, not formally tested Sitting-balance support: Feet supported Sitting balance-Leahy Scale: Fair Sitting balance - Comments: steady sitting EOB   Standing balance support: Bilateral upper extremity supported;During functional activity Standing balance-Leahy Scale: Poor Standing balance comment: reliant on UE support from RW                           ADL either performed or assessed with clinical judgement   ADL Overall ADL's : Needs assistance/impaired                                       General ADL Comments: Pt currently requires moderate assist for functional transfers and bed mobility. He is able to complete self-feeding and grooming tasks at bed level, but would require assistance for ADL mgt in seated position or standing. Pt limited by lethargy at time of OT evaluation, unable to consistently follow single step commands. His wife at bedside states she believes this is because  he had been soundly asleep just prior to OT evaluation.     Vision Baseline Vision/History: Wears glasses Wears Glasses: At all times Patient Visual Report: No change from baseline       Perception     Praxis      Pertinent Vitals/Pain Pain Assessment: No/denies pain Pain Score: 2  Pain Location: headache Pain  Descriptors / Indicators: Headache Pain Intervention(s): Monitored during session     Hand Dominance Left   Extremity/Trunk Assessment Upper Extremity Assessment Upper Extremity Assessment: Generalized weakness(BUE noted to be generally weak. MMT limited as pt unable to follow commands consistently. Grossly)   Lower Extremity Assessment Lower Extremity Assessment: Generalized weakness   Cervical / Trunk Assessment Cervical / Trunk Assessment: Kyphotic   Communication Communication Communication: HOH   Cognition Arousal/Alertness: Lethargic(Per wife at bedside, pt had been sleeping deeply prior to OT's arrival to room) Behavior During Therapy: WFL for tasks assessed/performed Overall Cognitive Status: History of cognitive impairments - at baseline                                     General Comments       Exercises Total Joint Exercises Ankle Circles/Pumps: AROM;Both;10 reps Long Arc Quad: AROM;Both;10 reps General Exercises - Lower Extremity Heel Raises: AROM;Both;10 reps;Seated Other Exercises Other Exercises: Pt and caregiver educated in falls prevention strategies, safe use of AE/DME for ADL management, and role of therapy in an acute care vs home health vs STR setting.   Shoulder Instructions      Home Living Family/patient expects to be discharged to:: Private residence Living Arrangements: Spouse/significant other Available Help at Discharge: Family;Available 24 hours/day(Wife available 24/7. Per wife, son is also nearby to assist PRN) Type of Home: House Home Access: Level entry     Home Layout: One level     Bathroom Shower/Tub: Occupational psychologist: Handicapped height Bathroom Accessibility: Yes How Accessible: Accessible via walker Home Equipment: Clinical cytogeneticist - 4 wheels;Cane - single point;Grab bars - tub/shower;Hand held shower head;Wheelchair - manual          Prior Functioning/Environment Level of Independence:  Independent with assistive device(s);Needs assistance  Gait / Transfers Assistance Needed: Caregiver reports pt ambulates with SPC for HH distances, and uses a 2WW for community distances. Is not leaving the house much 2/2 COVID ADL's / Homemaking Assistance Needed: Per caregiver, pt is able to dress and feed himself. He requires smoe assistance with showering. Wife manages IADLs including driving, mediation management, cleaning, etc.   Comments: pt reports independent with ADLs/IADLS, primarily ambulates with cane, denies any falls in the last 6 months        OT Problem List: Decreased strength;Decreased coordination;Cardiopulmonary status limiting activity;Decreased range of motion;Decreased cognition;Decreased activity tolerance;Decreased safety awareness;Decreased knowledge of use of DME or AE;Impaired balance (sitting and/or standing)      OT Treatment/Interventions: Self-care/ADL training;Balance training;Therapeutic exercise;Therapeutic activities;Energy conservation;DME and/or AE instruction;Patient/family education    OT Goals(Current goals can be found in the care plan section) Acute Rehab OT Goals Patient Stated Goal: return home OT Goal Formulation: With patient Time For Goal Achievement: 05/28/19 Potential to Achieve Goals: Good ADL Goals Pt Will Perform Grooming: sitting;standing;with min assist;with caregiver independent in assisting(With LRAD PRN for improved safety and functional independence upon hospital DC) Pt Will Transfer to Toilet: ambulating;regular height toilet;bedside commode;with min guard assist;with min assist(With LRAD PRN for improved safety and functional  independence upon hospital DC) Pt Will Perform Toileting - Clothing Manipulation and hygiene: sit to/from stand;with caregiver independent in assisting;with adaptive equipment;with min assist(With LRAD PRN for improved safety and functional independence upon hospital DC) Additional ADL Goal #1: Additional goal  added in error. NA. DC.  OT Frequency: Min 2X/week   Barriers to D/C:            Co-evaluation              AM-PAC OT "6 Clicks" Daily Activity     Outcome Measure Help from another person eating meals?: None Help from another person taking care of personal grooming?: A Little Help from another person toileting, which includes using toliet, bedpan, or urinal?: A Lot Help from another person bathing (including washing, rinsing, drying)?: A Lot Help from another person to put on and taking off regular upper body clothing?: A Little Help from another person to put on and taking off regular lower body clothing?: A Little 6 Click Score: 17   End of Session    Activity Tolerance: Patient limited by lethargy Patient left: in bed;with call bell/phone within reach;with bed alarm set;with family/visitor present;with SCD's reapplied  OT Visit Diagnosis: Other abnormalities of gait and mobility (R26.89);Muscle weakness (generalized) (M62.81)                Time: [id number] OT Time Calculation (min): 20 min Charges:  OT General Charges $OT Visit: 1 Visit OT Evaluation $OT Eval Moderate Complexity: 1 Mod OT Treatments $Self Care/Home Management : 8-22 mins  Shara Blazing, M.S., OTR/L Ascom: [phone] 05/14/19, 4:35 PM

## 2019-05-14 NOTE — Telephone Encounter (Signed)
Noted. I'm sorry to see he's in hospital.  HCTZ was supposed to be PRN LE edema, it seems they were regularly.  Will await D/C for f/u.

## 2019-05-14 NOTE — Plan of Care (Signed)
  Problem: Education: Goal: Knowledge of General Education information will improve Description: Including pain rating scale, medication(s)/side effects and non-pharmacologic comfort measures Outcome: Progressing   Problem: Clinical Measurements: Goal: Respiratory complications will improve Outcome: Progressing   Problem: Activity: Goal: Risk for activity intolerance will decrease Outcome: Progressing   

## 2019-05-14 NOTE — Evaluation (Signed)
Physical Therapy Evaluation Patient Details Name: Brandon Manning MRN: [id number] DOB: 11-24-1945 Today's Date: 05/14/2019   History of Present Illness  presented to ER after dizziness and lightheadedness, difficulty arousing and low BP, upon EMS arrival BP 95/43 in sitting, admitted for management of orthostatic hypotension. PMH includes PD, parkinsons related dementia, neurogenic orthostatic hypotension  Clinical Impression  Pt is a 73yo male admitted for above. Pt in bed upon arrival and agreed to participate with PT. Pt presents with decreased strength, cognition and endurance limiting functional mobility. Orthostatics: supine 156/81, sitting 128/71, standing 61/49 with pt reporting mild lightheadedness with initial sitting and standing. Pt required mod A for initial bed mobility and transfers and improved to min A throughout session. Pt performed sitting LE therex with min cuing for correct performance. Pt ambulated to/from restroom with RW requiring min A for Rw mgt and frequent cuing for upright posture. Pt ambulates with shuffle gait pattern and forward flexed trunk consistent with PD. Pt has wife at home 24/7 and a son that is nearby and able to come over whenever needed. Pt would benefit from skilled acute therapy to improve noted deficits and home health PT following hospital discharge to further improve deficits, return to PLOF and decrease caregiver burden.     Follow Up Recommendations Home health PT    Equipment Recommendations  None recommended by PT    Recommendations for Other Services       Precautions / Restrictions Precautions Precautions: Fall Restrictions Weight Bearing Restrictions: No      Mobility  Bed Mobility Overal bed mobility: Needs Assistance Bed Mobility: Supine to Sit;Sit to Supine     Supine to sit: Mod assist;HOB elevated Sit to supine: Min assist;HOB elevated   General bed mobility comments: initial supine to sit mod A to get EOB, physical  assist for LE advancement and trunk elevation, second supine to sit Min assist for LE advancement, min A sit to supine x2 trials assist for LE advancement and scooting in bed  Transfers Overall transfer level: Needs assistance Equipment used: Rolling walker (2 wheeled) Transfers: Sit to/from Stand Sit to Stand: Mod assist;Min assist;From elevated surface         General transfer comment: x4 trials, mod A to rise from low bed also requiring vc for hand placement, min A to rise from elevated bed x2 trials, min A to rise from low toilet with cuing for hand placement  Ambulation/Gait Ambulation/Gait assistance: Min assist Gait Distance (Feet): 10 Feet Assistive device: Rolling walker (2 wheeled) Gait Pattern/deviations: Decreased stride length;Shuffle;Decreased dorsiflexion - right;Decreased dorsiflexion - left;Step-through pattern;Trunk flexed Gait velocity: decreased   General Gait Details: pt ambulated to/from restroom with slow, shuffle gait pattern with little ground clearance B, pt required min A for RW mgt with frequent VC, vc for upright posture and keeping walker close with min carryover, no overt LOB noted  Stairs            Wheelchair Mobility    Modified Rankin (Stroke Patients Only)       Balance Overall balance assessment: Needs assistance;Mild deficits observed, not formally tested Sitting-balance support: Feet supported Sitting balance-Leahy Scale: Fair Sitting balance - Comments: steady sitting EOB   Standing balance support: Bilateral upper extremity supported;During functional activity Standing balance-Leahy Scale: Poor Standing balance comment: reliant on UE support from RW  Pertinent Vitals/Pain Pain Assessment: 0-10 Pain Score: 2  Pain Location: headache Pain Descriptors / Indicators: Headache Pain Intervention(s): Limited activity within patient's tolerance;Monitored during session    [place] expects to be discharged to:: Private residence Living Arrangements: Spouse/significant other Available Help at Discharge: Family;Available 24 hours/day(wife available 24/7, pts son close by) Type of Home: House Home Access: Level entry     Home Layout: One level Home Equipment: Clinical cytogeneticist - 4 wheels;Cane - single point;Grab bars - tub/shower      Prior Function Level of Independence: Independent with assistive device(s)         Comments: pt reports independent with ADLs/IADLS, primarily ambulates with cane, denies any falls in the last 6 months     Hand Dominance        Extremity/Trunk Assessment   Upper Extremity Assessment Upper Extremity Assessment: Defer to OT evaluation    Lower Extremity Assessment Lower Extremity Assessment: Generalized weakness(hip flexor strength 3+/5, B quad/ham strength 4-/5)    Cervical / Trunk Assessment Cervical / Trunk Assessment: Kyphotic  Communication   Communication: HOH  Cognition Arousal/Alertness: Awake/alert Behavior During Therapy: WFL for tasks assessed/performed Overall Cognitive Status: History of cognitive impairments - at baseline                                        General Comments      Exercises Total Joint Exercises Ankle Circles/Pumps: AROM;Both;10 reps Long Arc Quad: AROM;Both;10 reps General Exercises - Lower Extremity Heel Raises: AROM;Both;10 reps;Seated   Assessment/Plan    PT Assessment Patient needs continued PT services  PT Problem List Decreased strength;Decreased mobility;Decreased safety awareness;Decreased activity tolerance;Decreased balance;Decreased knowledge of use of DME;Decreased cognition;Decreased range of motion       PT Treatment Interventions DME instruction;Therapeutic exercise;Gait training;Balance training;Stair training;Neuromuscular re-education;Functional mobility training;Therapeutic activities;Patient/family education    PT Goals  (Current goals can be found in the Care Plan section)  Acute Rehab PT Goals Patient Stated Goal: return home PT Goal Formulation: With patient Time For Goal Achievement: 05/28/19 Potential to Achieve Goals: Good    Frequency Min 2X/week   Barriers to discharge        Co-evaluation               AM-PAC PT "6 Clicks" Mobility  Outcome Measure Help needed turning from your back to your side while in a flat bed without using bedrails?: A Little Help needed moving from lying on your back to sitting on the side of a flat bed without using bedrails?: A Little Help needed moving to and from a bed to a chair (including a wheelchair)?: A Little Help needed standing up from a chair using your arms (e.g., wheelchair or bedside chair)?: A Little Help needed to walk in hospital room?: A Little Help needed climbing 3-5 steps with a railing? : A Lot 6 Click Score: 17    End of Session Equipment Utilized During Treatment: Gait belt Activity Tolerance: Patient tolerated treatment well Patient left: in bed;with call bell/phone within reach;with bed alarm set Nurse Communication: Mobility status PT Visit Diagnosis: Muscle weakness (generalized) (M62.81);Other abnormalities of gait and mobility (R26.89);Difficulty in walking, not elsewhere classified (R26.2)    Time: [id number] PT Time Calculation (min) (ACUTE ONLY): 49 min   Charges:   PT Evaluation $PT Eval Moderate Complexity: 1 Mod PT Treatments $Therapeutic Activity: 8-22 mins  Zachary George PT, DPT 2:50 PM,05/14/19 [phone]

## 2019-05-14 NOTE — Plan of Care (Signed)
  Problem: Education: Goal: Knowledge of General Education information will improve Description: Including pain rating scale, medication(s)/side effects and non-pharmacologic comfort measures Outcome: Progressing   Problem: Safety: Goal: Ability to remain free from injury will improve Outcome: Progressing   

## 2019-05-14 NOTE — Progress Notes (Addendum)
[facility] [place] NAME: Brandon Manning    MR#:  [id number]  DATE OF BIRTH:  01-May-1946  SUBJECTIVE:  CHIEF COMPLAINT:  Pt has some baseline dementia answers few questions but poor historian.  Wife at bedside Significantly orthostatic when patient stood up  REVIEW OF SYSTEMS:  Limited review of system CONSTITUTIONAL: No fever, reports fatigue or weakness.  EYES: No blurred or double vision. .  RESPIRATORY: No cough, shortness of breath, wheezing or hemoptysis.  CARDIOVASCULAR: No chest pain, orthopnea, edema.  SKIN: No rash or lesion. MUSCULOSKELETAL: No joint pain or arthritis.   NEUROLOGIC: No tingling, numbness, weakness.  PSYCHIATRY: No anxiety or depression.   DRUG ALLERGIES:   Allergies  Allergen Reactions  . Penicillins Rash    Has patient had a PCN reaction causing immediate rash, facial/tongue/throat swelling, SOB or lightheadedness with hypotension: No Has patient had a PCN reaction causing severe rash involving mucus membranes or skin necrosis: No Has patient had a PCN reaction that required hospitalization No Has patient had a PCN reaction occurring within the last 10 years: No If all of the above answers are "NO", then may proceed with Cephalosporin use.    VITALS:  Blood pressure 93/60, pulse 75, temperature 98.5 F (36.9 C), temperature source Oral, resp. rate 18, height 5\' 11"  (1.803 m), weight 79.9 kg, SpO2 100 %.  PHYSICAL EXAMINATION:  GENERAL:  73 y.o.-year-old patient lying in the bed with no acute distress.  EYES: Pupils equal, round, reactive to light and accommodation. No scleral icterus. Extraocular muscles intact.  HEENT: Head atraumatic, normocephalic. Oropharynx and nasopharynx clear.  NECK:  Supple, no jugular venous distention. No thyroid enlargement, no tenderness.  LUNGS: Normal breath sounds bilaterally, no wheezing, rales,rhonchi or crepitation. No use of accessory muscles of respiration.   CARDIOVASCULAR: S1, S2 normal. No murmurs, rubs, or gallops.  ABDOMEN: Soft, nontender, nondistended. Bowel sounds present. EXTREMITIES: No pedal edema, cyanosis, or clubbing.  NEUROLOGIC: Cranial nerves II through XII are intact. Muscle strength global weakness in all extremities. Sensation intact. Gait not checked.  PSYCHIATRIC: The patient is alert and oriented x2-3.  SKIN: No obvious rash, lesion, or ulcer.    LABORATORY PANEL:   CBC Recent Labs  Lab 05/13/19 1642  WBC 10.1  HGB 12.2*  HCT 39.5  PLT 261   ------------------------------------------------------------------------------------------------------------------  Chemistries  Recent Labs  Lab 05/14/19 0400  NA 143  K 3.7  CL 109  CO2 26  GLUCOSE 126*  BUN 24*  CREATININE 1.28*  CALCIUM 8.8*  MG 1.9  AST 14*  ALT 10  ALKPHOS 49  BILITOT 1.3*   ------------------------------------------------------------------------------------------------------------------  Cardiac Enzymes No results for input(s): TROPONINI in the last 168 hours. ------------------------------------------------------------------------------------------------------------------  RADIOLOGY:  Ct Head Wo Contrast  Result Date: 05/13/2019 CLINICAL DATA:  Syncope, weakness, dizziness EXAM: CT HEAD WITHOUT CONTRAST TECHNIQUE: Contiguous axial images were obtained from the base of the skull through the vertex without intravenous contrast. COMPARISON:  01/25/2017, 08/05/2009 FINDINGS: Brain: No evidence of acute infarction, hemorrhage, extra-axial collection, ventriculomegaly, or mass effect. Generalized cerebral atrophy. Periventricular white matter low attenuation likely secondary to microangiopathy. Vascular: Cerebrovascular atherosclerotic calcifications are noted. Skull: Negative for fracture or focal lesion. Sinuses/Orbits: Visualized portions of the orbits are unremarkable. Visualized portions of the paranasal sinuses and mastoid air cells are  unremarkable. Other: Partially visualized sebaceous cyst in the posterior scalp. IMPRESSION: No acute intracranial pathology. Electronically Signed   By: Kathreen Devoid   On: 05/13/2019  17:43   Dg Chest Portable 1 View  Result Date: 05/13/2019 CLINICAL DATA:  Syncope, weakness, dizziness EXAM: PORTABLE CHEST 1 VIEW COMPARISON:  01/25/2017 FINDINGS: The heart size and mediastinal contours are within normal limits. Both lungs are clear. The visualized skeletal structures are unremarkable. IMPRESSION: No active disease. Electronically Signed   By: Kathreen Devoid   On: 05/13/2019 17:40    EKG:   Orders placed or performed during the hospital encounter of 05/13/19  . EKG 12-Lead  . EKG 12-Lead  . ED EKG  . ED EKG    ASSESSMENT AND PLAN:    73 y.o. male with past medical history of hypertension, neurogenic orthostatic hypotension, Parkinson's disease, Parkinson's related dementia and constipation presenting to the ED with syncope.  1. Syncope - secondary to orthostatic hypotension from AKI with known hx of neurogenic orthostatic hypotension r/t Parkinson's disease. Orthostatic likely worsened by dehydration from diuretic drugs, recently started on hydrochlorothiazide, according to primary care physician patient is supposed to take hydrochlorothiazide as needed but patient has been taking it regularly discontinue HCTZ -- CXR shows no active cardiopulmonary process - Orthostatic Vital Signs-156/81 in lying for sure sitting for sure 128/71 and standing posture 61/49 - Head CT negative for acute intracranial abnormality -Hydrate with IV fluids  2. Neurogenic orthostatic hypotension -admitted with syncope July, 2018, and again in October, 2018 - Follows with movement disorder specialist Dr. Carles Collet - Continue midodrine at half dose and monitor for supine hypertension as his BP is significantly dropping during sitting and standing postures  - Will schedule Midodrine to be given with each dose of  Carbidopa- Levodopa  3. Idiopathic parkinsonism -Continue carbidopa levodopa 25/100 mg 2 tabs in the am, 2 tabs in the afternoon and 1 tab in the evening - PT/OT-recommending home health PT - Follows with Dr. Carles Collet  4. Hypertension -now with hypotension in the setting of orthostatic - d/c hydrochlorothiazide -Small dose beta-blocker added with holding parameters  5. Hypokalemia -repeat potassium at 3.7 and magnesium at 1.9  6. Hypocalcemia -likely secondary to hypoalbuminemia - Calcium at 8.8 today TSH is normal magnesium at 1.9  7. 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 DVT prophylaxis - Enoxaparin SubQ    All the records are reviewed and case discussed with Care Management/Social Workerr. Management plans discussed with the patient, wife at bedside and they are in agreement.  CODE STATUS: fc  TOTAL TIME TAKING CARE OF THIS PATIENT: 35 minutes.   POSSIBLE D/C IN  DAYS, DEPENDING ON CLINICAL CONDITION.  Note: This dictation was prepared with Dragon dictation along with smaller phrase technology. Any transcriptional errors that result from this process are unintentional.   Nicholes Mango M.D on 05/14/2019 at 3:30 PM  Between 7am to 6pm - Pager - [phone] After 6pm go to [email or web address] - password [email or web address] [facility]  [facility]  Office  [phone]  CC: Primary care physician; [name], MD

## 2019-05-14 NOTE — Plan of Care (Signed)
  Problem: Clinical Measurements: Goal: Ability to maintain clinical measurements within normal limits will improve Outcome: Progressing Goal: Will remain free from infection Outcome: Progressing   Problem: Activity: Goal: Risk for activity intolerance will decrease Outcome: Not Progressing Note: Encourage activity   Problem: Clinical Measurements: Goal: Respiratory complications will improve Outcome: Completed/Met Goal: Cardiovascular complication will be avoided Outcome: Completed/Met

## 2019-05-14 NOTE — Telephone Encounter (Signed)
[place] Night - Client Nonclinical Telephone Record AccessNurse Client [facility] Night - Client Client Site [place] Physician [name] - MD Contact Type Call Who Is Calling Patient / Member / Family / Caregiver Caller Name Lunden Moberly Caller Phone Number [phone] Patient Name Konrad Desai Patient DOB Apr 15, 1946 Call Type Message Only Information Provided Reason for Call Request to [facility] Appointment Initial Comment Caller states that her husband he has an appointment today, but he is in the hospital and will not be able to make the appointment. Additional Comment Caller states that she would like a call back from the nurse for office. Caller does not want to be charged $50.00 for a missed appointment, her husband is in the hospital and can not help it. He went to the hospital over [place] blood pressure being so low. Call Closed By: Bryson Ha Transaction Date/Time: 05/14/2019 7:37:13 AM (ET)

## 2019-05-14 NOTE — Progress Notes (Signed)
Pt BP was 170/98. NP Benjamine Mola was notified. No new orders given at this time but to continue to monitor.

## 2019-05-14 NOTE — Telephone Encounter (Signed)
Returned pt's wife, [name] (on dpr), call.  States pt admitted to [facility] yesterday due to fainting in the shower.  Says his BP dropped to 91/41 and she had to call EMS to transport him.  Says the hosp doc has stopped HCTZ for now.  She also wanted to c/x pt's OV for today.  Fyi to Dr. Darnell Level.

## 2019-05-14 NOTE — Telephone Encounter (Signed)
Patient's Wife called today requesting a call back as soon as possible.  He was admitted to the hospital due to having a reaction to medication he was prescribed.   She stated she will be leaving the house @ 11 to go back to the hospital so she would like a call back before then

## 2019-05-15 LAB — BASIC METABOLIC PANEL
Anion gap: 3 — ABNORMAL LOW (ref 5–15)
BUN: 21 mg/dL (ref 8–23)
CO2: 28 mmol/L (ref 22–32)
Calcium: 8.8 mg/dL — ABNORMAL LOW (ref 8.9–10.3)
Chloride: 111 mmol/L (ref 98–111)
Creatinine, Ser: 1.21 mg/dL (ref 0.61–1.24)
GFR calc Af Amer: >60 mL/min (ref >60)
GFR calc non Af Amer: 58 mL/min — ABNORMAL LOW (ref >60)
Glucose, Bld: 140 mg/dL — ABNORMAL HIGH (ref 70–99)
Potassium: 3.9 mmol/L (ref 3.5–5.1)
Sodium: 142 mmol/L (ref 135–145)

## 2019-05-15 LAB — GLUCOSE, CAPILLARY
Glucose-Capillary: 130 mg/dL — ABNORMAL HIGH (ref 70–99)
Glucose-Capillary: 133 mg/dL — ABNORMAL HIGH (ref 70–99)

## 2019-05-15 LAB — CBC
HCT: 35.7 % — ABNORMAL LOW (ref 39.0–52.0)
Hemoglobin: 11.3 g/dL — ABNORMAL LOW (ref 13.0–17.0)
MCH: 27.5 pg (ref 26.0–34.0)
MCHC: 31.7 g/dL (ref 30.0–36.0)
MCV: 86.9 fL (ref 80.0–100.0)
Platelets: 234 10*3/uL (ref 150–400)
RBC: 4.11 MIL/uL — ABNORMAL LOW (ref 4.22–5.81)
RDW: 14.6 % (ref 11.5–15.5)
WBC: 9.3 10*3/uL (ref 4.0–10.5)
nRBC: 0 % (ref 0.0–0.2)

## 2019-05-15 MED ORDER — POLYETHYLENE GLYCOL 3350 17 G PO PACK
17.0000 g | PACK | Freq: Every day | ORAL | Status: DC
  Administered 2019-05-15 – 2019-05-16 (×2): 17 g via ORAL
  Filled 2019-05-15 (×2): qty 1

## 2019-05-15 MED ORDER — METOPROLOL TARTRATE 5 MG/5ML IV SOLN
5.0000 mg | Freq: Once | INTRAVENOUS | Status: AC
  Administered 2019-05-15: 5 mg via INTRAVENOUS
  Filled 2019-05-15: qty 5

## 2019-05-15 MED ORDER — METOPROLOL TARTRATE 25 MG PO TABS
25.0000 mg | ORAL_TABLET | Freq: Two times a day (BID) | ORAL | Status: DC
  Administered 2019-05-15 – 2019-05-16 (×2): 25 mg via ORAL
  Filled 2019-05-15 (×2): qty 1

## 2019-05-15 MED ORDER — DOCUSATE SODIUM 100 MG PO CAPS
100.0000 mg | ORAL_CAPSULE | Freq: Two times a day (BID) | ORAL | Status: DC
  Administered 2019-05-15 – 2019-05-16 (×2): 100 mg via ORAL
  Filled 2019-05-15 (×2): qty 1

## 2019-05-15 MED ORDER — METOPROLOL TARTRATE 25 MG PO TABS
25.0000 mg | ORAL_TABLET | Freq: Once | ORAL | Status: AC
  Administered 2019-05-15: 25 mg via ORAL
  Filled 2019-05-15: qty 1

## 2019-05-15 NOTE — TOC Initial Note (Signed)
Transition of Care [facility]) - Initial/Assessment Note    Patient Details  Name: Brandon Manning MRN: [id number] Date of Birth: 1945-07-28  [facility]) CM/SW Contact:    Candie Chroman, LCSW Phone Number: 05/15/2019, 10:02 AM  Clinical Narrative: Patient not fully oriented. CSW called patient's wife, introduced role, and explained that PT recommendations would be discussed. Patient's wife declined home health because due to [place] pandemic she does not want people in and out of the house. She would prefer to have information sent home so patient can do exercises on his own. CSW sent message to PT and OT with this request. Patient has a walker, cane, and wheelchair at home. OT recommended a 3-in-1. Wife agreeable. Will order once we know when he will discharge. No further concerns. CSW encouraged patient's wife to contact CSW as needed. CSW will continue to follow patient and his wife for support and facilitate return home when stable.                 Expected Discharge Plan: Home/Self Care     Patient Goals and CMS Choice Patient states their goals for this hospitalization and ongoing recovery are:: Patient not fully oriented.   Choice offered to / list presented to : NA  Expected Discharge Plan and Services Expected Discharge Plan: Home/Self Care     Post Acute Care Choice: Durable Medical Equipment Living arrangements for the past 2 months: Single Family Home                                      Prior Living Arrangements/Services Living arrangements for the past 2 months: Single Family Home Lives with:: Spouse Patient language and need for interpreter reviewed:: Yes Do you feel safe going back to the place where you live?: Yes      Need for Family Participation in Patient Care: Yes (Comment) Care giver support system in place?: Yes (comment) Current home services: DME Criminal Activity/Legal Involvement Pertinent to Current Situation/Hospitalization: No -  Comment as needed  Activities of Daily Living Home Assistive Devices/Equipment: Dentures (specify type), Walker (specify type) ADL Screening (condition at time of admission) Patient's cognitive ability adequate to safely complete daily activities?: No Is the patient deaf or have difficulty hearing?: Yes Does the patient have difficulty seeing, even when wearing glasses/contacts?: Yes Does the patient have difficulty concentrating, remembering, or making decisions?: Yes Patient able to express need for assistance with ADLs?: No Does the patient have difficulty dressing or bathing?: Yes Independently performs ADLs?: No Communication: Independent Dressing (OT): Needs assistance Is this a change from baseline?: Pre-admission baseline Grooming: Needs assistance Is this a change from baseline?: Pre-admission baseline Feeding: Needs assistance Is this a change from baseline?: Pre-admission baseline Bathing: Needs assistance Is this a change from baseline?: Pre-admission baseline Toileting: Needs assistance Is this a change from baseline?: Pre-admission baseline In/Out Bed: Needs assistance Is this a change from baseline?: Pre-admission baseline Walks in Home: Needs assistance Is this a change from baseline?: Pre-admission baseline Does the patient have difficulty walking or climbing stairs?: Yes Weakness of Legs: Both Weakness of Arms/Hands: Both  Permission Sought/Granted Permission sought to share information with : Family Supports Permission granted to share information with : Yes, Verbal Permission Granted  Share Information with NAME: Alexia Quant     Permission granted to share info w Relationship: Wife  Permission granted to share info w Contact  Information: [phone]  Emotional Assessment Appearance:: Appears stated age Attitude/Demeanor/Rapport: Unable to Assess Affect (typically observed): Unable to Assess Orientation: : Oriented to Self, Oriented to Place,  Oriented to Situation(Appears lethargic/confused.) Alcohol / Substance Use: Never Used Psych Involvement: No (comment)  Admission diagnosis:  Hypocalcemia [E83.51] Syncope, unspecified syncope type [R55] Patient Active Problem List   Diagnosis Date Noted  . Syncope 05/13/2019  . Pedal edema 04/16/2019  . Vitamin D deficiency 02/02/2019  . Vitamin B12 deficiency 05/10/2018  . Urge incontinence 03/16/2018  . Hematuria 03/16/2018  . Orthostatic hypotension 01/26/2017  . Orthostasis 01/25/2017  . Encounter for chronic pain management 01/07/2017  . Memory deficit 07/08/2016  . Chronic constipation 12/19/2015  . Medicare annual wellness visit, initial 06/03/2015  . Advanced care planning/counseling discussion 06/03/2015  . Bilateral low back pain without sciatica 08/01/2014  . Parkinson disease ([place]) 02/01/2014  . 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 BPH (benign prostatic hyperplasia) 11/03/2012  . HTN (hypertension) 09/09/2011  . Health maintenance examination 06/18/2011  . ED (erectile dysfunction) 06/18/2011   PCP:  [name], MD Pharmacy:   [facility] [address] [place] [address] [place] ([place]) [place] [address] Phone: [phone] Fax: [facility] [place], [place] [address] Phone: [phone] Fax: [phone]     Social Determinants of Health (SDOH) Interventions    Readmission Risk Interventions No flowsheet data found.

## 2019-05-15 NOTE — Progress Notes (Signed)
MD notified. Pts BP is elevated. MD orders 5mg  IV metoprolol times one and 25 mg metoprolol PO x one. I will continue to assess.

## 2019-05-15 NOTE — Progress Notes (Signed)
[place] at [place] NAME: Brandon Manning    MR#:  [id number]  DATE OF BIRTH:  February 01, 1946  SUBJECTIVE:  CHIEF COMPLAINT:  Pt has some baseline dementia answers few questions but poor historian.  Still feeling lightheaded when standing.  Blood pressures are fluctuating a lot  wife at bedside   REVIEW OF SYSTEMS:  Limited review of system CONSTITUTIONAL: No fever, reports fatigue or weakness.  EYES: No blurred or double vision. .  RESPIRATORY: No cough, shortness of breath, wheezing or hemoptysis.  CARDIOVASCULAR: No chest pain, orthopnea, edema.  SKIN: No rash or lesion. MUSCULOSKELETAL: No joint pain or arthritis.   NEUROLOGIC: No tingling, numbness, weakness.  PSYCHIATRY: No anxiety or depression.   DRUG ALLERGIES:   Allergies  Allergen Reactions  . Penicillins Rash    Has patient had a PCN reaction causing immediate rash, facial/tongue/throat swelling, SOB or lightheadedness with hypotension: No Has patient had a PCN reaction causing severe rash involving mucus membranes or skin necrosis: No Has patient had a PCN reaction that required hospitalization No Has patient had a PCN reaction occurring within the last 10 years: No If all of the above answers are "NO", then may proceed with Cephalosporin use.    VITALS:  Blood pressure (!) 191/95, pulse 67, temperature 98.2 F (36.8 C), temperature source Oral, resp. rate 19, height 5\' 11"  (1.803 m), weight 82.6 kg, SpO2 100 %.  PHYSICAL EXAMINATION:  GENERAL:  73 y.o.-year-old patient lying in the bed with no acute distress.  EYES: Pupils equal, round, reactive to light and accommodation. No scleral icterus. Extraocular muscles intact.  HEENT: Head atraumatic, normocephalic. Oropharynx and nasopharynx clear.  NECK:  Supple, no jugular venous distention. No thyroid enlargement, no tenderness.  LUNGS: Normal breath sounds bilaterally, no wheezing, rales,rhonchi or crepitation. No use  of accessory muscles of respiration.  CARDIOVASCULAR: S1, S2 normal. No murmurs, rubs, or gallops.  ABDOMEN: Soft, nontender, nondistended. Bowel sounds present. EXTREMITIES: No pedal edema, cyanosis, or clubbing.  NEUROLOGIC: Cranial nerves II through XII are intact. Muscle strength global weakness in all extremities. Sensation intact. Gait not checked.  PSYCHIATRIC: The patient is alert and oriented x2-3.  SKIN: No obvious rash, lesion, or ulcer.    LABORATORY PANEL:   CBC Recent Labs  Lab 05/15/19 0543  WBC 9.3  HGB 11.3*  HCT 35.7*  PLT 234   ------------------------------------------------------------------------------------------------------------------  Chemistries  Recent Labs  Lab 05/14/19 0400 05/15/19 0543  NA 143 142  K 3.7 3.9  CL 109 111  CO2 26 28  GLUCOSE 126* 140*  BUN 24* 21  CREATININE 1.28* 1.21  CALCIUM 8.8* 8.8*  MG 1.9  --   AST 14*  --   ALT 10  --   ALKPHOS 49  --   BILITOT 1.3*  --    ------------------------------------------------------------------------------------------------------------------  Cardiac Enzymes No results for input(s): TROPONINI in the last 168 hours. ------------------------------------------------------------------------------------------------------------------  RADIOLOGY:  Ct Head Wo Contrast  Result Date: 05/13/2019 CLINICAL DATA:  Syncope, weakness, dizziness EXAM: CT HEAD WITHOUT CONTRAST TECHNIQUE: Contiguous axial images were obtained from the base of the skull through the vertex without intravenous contrast. COMPARISON:  01/25/2017, 08/05/2009 FINDINGS: Brain: No evidence of acute infarction, hemorrhage, extra-axial collection, ventriculomegaly, or mass effect. Generalized cerebral atrophy. Periventricular white matter low attenuation likely secondary to microangiopathy. Vascular: Cerebrovascular atherosclerotic calcifications are noted. Skull: Negative for fracture or focal lesion. Sinuses/Orbits: Visualized  portions of the orbits are unremarkable. Visualized portions of the  paranasal sinuses and mastoid air cells are unremarkable. Other: Partially visualized sebaceous cyst in the posterior scalp. IMPRESSION: No acute intracranial pathology. Electronically Signed   By: Kathreen Devoid   On: 05/13/2019 17:43   Dg Chest Portable 1 View  Result Date: 05/13/2019 CLINICAL DATA:  Syncope, weakness, dizziness EXAM: PORTABLE CHEST 1 VIEW COMPARISON:  01/25/2017 FINDINGS: The heart size and mediastinal contours are within normal limits. Both lungs are clear. The visualized skeletal structures are unremarkable. IMPRESSION: No active disease. Electronically Signed   By: Kathreen Devoid   On: 05/13/2019 17:40    EKG:   Orders placed or performed during the hospital encounter of 05/13/19  . EKG 12-Lead  . EKG 12-Lead  . ED EKG  . ED EKG    ASSESSMENT AND PLAN:    73 y.o. male with past medical history of hypertension, neurogenic orthostatic hypotension, Parkinson's disease, Parkinson's related dementia and constipation presenting to the ED with syncope.  1. Syncope - secondary to orthostatic hypotension from AKI with known hx of neurogenic orthostatic hypotension r/t Parkinson's disease. Orthostatic likely worsened by dehydration from diuretic drugs, recently started on hydrochlorothiazide, according to primary care physician patient is supposed to take hydrochlorothiazide as needed but patient has been taking it regularly discontinue HCTZ -- CXR shows no active cardiopulmonary process - Orthostatic Vital Signs-positive - Head CT negative for acute intracranial abnormality -S/p IV fluid hydration  2. Neurogenic orthostatic hypotension -admitted with syncope July, 2018, and again in October, 2018 - Follows with movement disorder specialist Dr. Carles Collet - Holding midodrine today as patient's blood pressure is significantly elevated considered resume midodrine if patient continues to be dizzy, if so schedule  Midodrine to be given with each dose of Carbidopa- Levodopa -Consult neurology Dr. Doy Mince  3. Idiopathic parkinsonism -Continue carbidopa levodopa 25/100 mg 2 tabs in the am, 2 tabs in the afternoon and 1 tab in the evening - PT/OT-recommending home health PT - Follows with Dr. Carles Collet  4. Hypertension -now with hypotension in the setting of orthostatic - d/c hydrochlorothiazide -Beta-blocker is added to the regimen, titrate as needed - 5. Hypokalemia -repeat potassium at 3.7 and magnesium at 1.9  6. Hypocalcemia -likely secondary to hypoalbuminemia - Calcium at 8.8  TSH is normal magnesium at 1.9  7. 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   Vitamin D deficiency will provide vitamin D supplements  DVT prophylaxis - Enoxaparin SubQ    All the records are reviewed and case discussed with Care Management/Social Workerr. Management plans discussed with the patient, wife at bedside and they are in agreement.  CODE STATUS: fc  TOTAL TIME TAKING CARE OF THIS PATIENT: 35 minutes.   POSSIBLE D/C IN  DAYS, DEPENDING ON CLINICAL CONDITION.  Note: This dictation was prepared with Dragon dictation along with smaller phrase technology. Any transcriptional errors that result from this process are unintentional.   Nicholes Mango M.D on 05/15/2019 at 2:04 PM  Between 7am to 6pm - Pager - [phone] After 6pm go to [email or web address] - password [email or web address]  [place]  Office  [phone]  CC: Primary care physician; [name], MD

## 2019-05-16 ENCOUNTER — Ambulatory Visit: Payer: Medicare HMO | Admitting: Family Medicine

## 2019-05-16 ENCOUNTER — Telehealth: Payer: Self-pay

## 2019-05-16 LAB — BASIC METABOLIC PANEL
Anion gap: 8 (ref 5–15)
BUN: 15 mg/dL (ref 8–23)
CO2: 24 mmol/L (ref 22–32)
Calcium: 8.8 mg/dL — ABNORMAL LOW (ref 8.9–10.3)
Chloride: 106 mmol/L (ref 98–111)
Creatinine, Ser: 1.13 mg/dL (ref 0.61–1.24)
GFR calc Af Amer: >60 mL/min (ref >60)
GFR calc non Af Amer: >60 mL/min (ref >60)
Glucose, Bld: 133 mg/dL — ABNORMAL HIGH (ref 70–99)
Potassium: 3.7 mmol/L (ref 3.5–5.1)
Sodium: 138 mmol/L (ref 135–145)

## 2019-05-16 LAB — GLUCOSE, CAPILLARY
Glucose-Capillary: 129 mg/dL — ABNORMAL HIGH (ref 70–99)
Glucose-Capillary: 148 mg/dL — ABNORMAL HIGH (ref 70–99)

## 2019-05-16 LAB — CALCIUM, IONIZED: Calcium, Ionized, Serum: 5 mg/dL (ref 4.5–5.6)

## 2019-05-16 MED ORDER — METOPROLOL TARTRATE 25 MG PO TABS: 12.5000 mg | ORAL_TABLET | Freq: Two times a day (BID) | ORAL | 0 refills | Status: DC

## 2019-05-16 NOTE — Telephone Encounter (Signed)
Spoke with pt's wife, [name] (on dpr).  States she is too petite to care for pt with him being so weak.  And pt's son has a bad back and is not able to help much.  [name] is trying to get pt into [facility] or [facility] for rehab (preferably [facility]).  She was told they need a 21 day rehab order from PCP.   She requests a call back today to let her know if this is possible.

## 2019-05-16 NOTE — Discharge Instructions (Signed)
Near-Syncope Near-syncope is when you suddenly get weak or dizzy, or you feel like you might pass out (faint). This may also be called presyncope. This is due to a lack of blood flow to the brain. During an episode of near-syncope, you may:  Feel dizzy, weak, or light-headed.  Feel sick to your stomach (nauseous).  See all white or all black.  See spots.  Have cold, clammy skin. This condition is caused by a sudden decrease in blood flow to the brain. This decrease can result from various causes, but most of those causes are not dangerous. However, near-syncope may be a sign of a serious medical problem, so it is important to seek medical care. Follow these instructions at home: Medicines  Take over-the-counter and prescription medicines only as told by your doctor.  If you are taking blood pressure or heart medicine, get up slowly and spend many minutes getting ready to sit and then stand. This can help with dizziness. General instructions  Be aware of any changes in your symptoms.  Talk with your doctor about your symptoms. You may need to have testing to find the cause of your near-syncope.  If you start to feel like you might pass out, lie down right away. Raise (elevate) your feet above the level of your heart. Breathe deeply and steadily. Wait until all of the symptoms are gone.  Have someone stay with you until you feel stable.  Do not drive, use machinery, or play sports until your doctor says it is okay.  Drink enough fluid to keep your pee (urine) pale yellow.  Keep all follow-up visits as told by your doctor. This is important. Get help right away if you:  Have a seizure.  Have pain in your: ? Chest. ? Belly (abdomen). ? Back.  Faint once or more than once.  Have a very bad headache.  Are bleeding from your mouth or butt.  Have black or tarry poop (stool).  Have a very fast or uneven heartbeat (palpitations).  Are mixed up (confused).  Have trouble  walking.  Are very weak.  Have trouble seeing. These symptoms may be an emergency. Do not wait to see if the symptoms will go away. Get medical help right away. Call your local emergency services (911 in the [place] Do not drive yourself to the hospital. Summary  Near-syncope is when you suddenly get weak or dizzy, or you feel like you might pass out (faint).  This condition is caused by a lack of blood flow to the brain.  Near-syncope may be a sign of a serious medical problem, so it is important to seek medical care. This information is not intended to replace advice given to you by your health care provider. Make sure you discuss any questions you have with your health care provider. Document Released: 12/22/2007 Document Revised: 10/27/2018 Document Reviewed: 05/24/2018 Elsevier Patient Education  2020 Elsevier Inc.  

## 2019-05-16 NOTE — TOC Transition Note (Signed)
Transition of Care [facility]) - CM/SW Discharge Note   Patient Details  Name: DOMNIQUE SCHETTLER MRN: [id number] Date of Birth: [birth year]  [facility] CM/SW Contact:  Candie Chroman, LCSW Phone Number: 05/16/2019, 8:27 AM   Clinical Narrative: Patient has orders to discharge home today. MD discussed outpatient palliative with wife this morning and she is agreeable. CSW confirmed with patient and left message for Flo Shanks, RN with [facility] to make referral. Patient's wife prefers phone follow up to avoid COVID exposure. CSW called [facility] and ordered 3-in-1. No further concerns. CSW signing off.    Final next level of care: Home/Self Care Barriers to Discharge: Barriers Resolved   Patient Goals and CMS Choice Patient states their goals for this hospitalization and ongoing recovery are:: Patient not fully oriented.   Choice offered to / list presented to : NA  Discharge Placement                Patient to be transferred to facility by: Wife will pick him up. Name of family member notified: Tollie Gargus Patient and family notified of of transfer: 05/16/19  Discharge Plan and Services     Post Acute Care Choice: Durable Medical Equipment          DME Arranged: 3-N-1 DME Agency: [facility] Date DME Agency Contacted: 05/16/19   Representative spoke with at DME Agency: [place] Arranged: NA          Social Determinants of Health (SDOH) Interventions     Readmission Risk Interventions No flowsheet data found.

## 2019-05-16 NOTE — Telephone Encounter (Signed)
Patient's wife is requesting a call back  She stated that the patient is still very weak so it is hard from him to get up and they can not lift him. So they are wanting to try to get the patient into a rehab.  Wife is really wanting a call back today

## 2019-05-16 NOTE — Progress Notes (Signed)
New referral for [facility] community Palliative program to follow at home received from [place]. Plan is for discharge home today. Patient information given to referral. Flo Shanks BSN, RN, [place] [phone]

## 2019-05-16 NOTE — Telephone Encounter (Signed)
error 

## 2019-05-16 NOTE — Telephone Encounter (Signed)
Spoke with Brandon Manning relaying Dr. Synthia Innocent message.  Verbalizes understanding and expresses her thanks.  Informed her the form will be ready to pick up starting tomorrow.

## 2019-05-16 NOTE — Telephone Encounter (Signed)
This really would have been more smoothly done during hospitalization - sometimes it's harder for [place] to get approval outpatient.  All I know to do is fill out an FL2 form requesting placement for rehab - we can work on this and then she needs to call [facility] to try to set up rehab.

## 2019-05-16 NOTE — Care Management Important Message (Signed)
Important Message  Patient Details  Name: Brandon Manning MRN: [id number] Date of Birth: 09-05-1945   Medicare Important Message Given:  Yes     [name] 05/16/2019, 10:50 AM

## 2019-05-17 ENCOUNTER — Inpatient Hospital Stay
Admission: EM | Admit: 2019-05-17 | Discharge: 2019-05-24 | DRG: 074 | Disposition: A | Discharge status: Skilled Nursing Facility | Payer: Medicare HMO | Attending: Internal Medicine | Admitting: Internal Medicine

## 2019-05-17 ENCOUNTER — Encounter: Payer: Self-pay | Admitting: Intensive Care

## 2019-05-17 ENCOUNTER — Other Ambulatory Visit: Payer: Self-pay

## 2019-05-17 ENCOUNTER — Telehealth: Payer: Self-pay | Admitting: Family Medicine

## 2019-05-17 DIAGNOSIS — E559 Vitamin D deficiency, unspecified: Secondary | ICD-10-CM | POA: Diagnosis not present

## 2019-05-17 DIAGNOSIS — I959 Hypotension, unspecified: Secondary | ICD-10-CM | POA: Diagnosis not present

## 2019-05-17 DIAGNOSIS — R531 Weakness: Secondary | ICD-10-CM | POA: Diagnosis present

## 2019-05-17 DIAGNOSIS — N179 Acute kidney failure, unspecified: Secondary | ICD-10-CM | POA: Diagnosis not present

## 2019-05-17 DIAGNOSIS — N189 Chronic kidney disease, unspecified: Secondary | ICD-10-CM | POA: Diagnosis present

## 2019-05-17 DIAGNOSIS — Z79899 Other long term (current) drug therapy: Secondary | ICD-10-CM

## 2019-05-17 DIAGNOSIS — N400 Enlarged prostate without lower urinary tract symptoms: Secondary | ICD-10-CM | POA: Diagnosis not present

## 2019-05-17 DIAGNOSIS — Z20828 Contact with and (suspected) exposure to other viral communicable diseases: Secondary | ICD-10-CM | POA: Diagnosis present

## 2019-05-17 DIAGNOSIS — G20A1 Parkinson's disease without dyskinesia, without mention of fluctuations: Secondary | ICD-10-CM | POA: Diagnosis present

## 2019-05-17 DIAGNOSIS — R0600 Dyspnea, unspecified: Secondary | ICD-10-CM | POA: Diagnosis not present

## 2019-05-17 DIAGNOSIS — R0902 Hypoxemia: Secondary | ICD-10-CM | POA: Diagnosis not present

## 2019-05-17 DIAGNOSIS — Z87891 Personal history of nicotine dependence: Secondary | ICD-10-CM | POA: Diagnosis not present

## 2019-05-17 DIAGNOSIS — R4182 Altered mental status, unspecified: Secondary | ICD-10-CM | POA: Diagnosis not present

## 2019-05-17 DIAGNOSIS — E538 Deficiency of other specified B group vitamins: Secondary | ICD-10-CM | POA: Diagnosis not present

## 2019-05-17 DIAGNOSIS — Z7982 Long term (current) use of aspirin: Secondary | ICD-10-CM

## 2019-05-17 DIAGNOSIS — R609 Edema, unspecified: Secondary | ICD-10-CM | POA: Diagnosis present

## 2019-05-17 DIAGNOSIS — Z841 Family history of disorders of kidney and ureter: Secondary | ICD-10-CM

## 2019-05-17 DIAGNOSIS — R351 Nocturia: Secondary | ICD-10-CM | POA: Diagnosis not present

## 2019-05-17 DIAGNOSIS — G20 Parkinson's disease: Secondary | ICD-10-CM | POA: Diagnosis not present

## 2019-05-17 DIAGNOSIS — F0280 Dementia in other diseases classified elsewhere without behavioral disturbance: Secondary | ICD-10-CM | POA: Diagnosis not present

## 2019-05-17 DIAGNOSIS — M2550 Pain in unspecified joint: Secondary | ICD-10-CM | POA: Diagnosis not present

## 2019-05-17 DIAGNOSIS — I159 Secondary hypertension, unspecified: Secondary | ICD-10-CM | POA: Diagnosis not present

## 2019-05-17 DIAGNOSIS — I10 Essential (primary) hypertension: Secondary | ICD-10-CM | POA: Diagnosis not present

## 2019-05-17 DIAGNOSIS — Z880 Allergy status to penicillin: Secondary | ICD-10-CM | POA: Diagnosis not present

## 2019-05-17 DIAGNOSIS — R29898 Other symptoms and signs involving the musculoskeletal system: Secondary | ICD-10-CM | POA: Diagnosis not present

## 2019-05-17 DIAGNOSIS — I129 Hypertensive chronic kidney disease with stage 1 through stage 4 chronic kidney disease, or unspecified chronic kidney disease: Secondary | ICD-10-CM | POA: Diagnosis not present

## 2019-05-17 DIAGNOSIS — R55 Syncope and collapse: Secondary | ICD-10-CM | POA: Diagnosis not present

## 2019-05-17 DIAGNOSIS — N3941 Urge incontinence: Secondary | ICD-10-CM | POA: Diagnosis not present

## 2019-05-17 DIAGNOSIS — I951 Orthostatic hypotension: Secondary | ICD-10-CM | POA: Diagnosis present

## 2019-05-17 DIAGNOSIS — R600 Localized edema: Secondary | ICD-10-CM

## 2019-05-17 DIAGNOSIS — Z8249 Family history of ischemic heart disease and other diseases of the circulatory system: Secondary | ICD-10-CM | POA: Diagnosis not present

## 2019-05-17 DIAGNOSIS — M545 Low back pain: Secondary | ICD-10-CM | POA: Diagnosis present

## 2019-05-17 DIAGNOSIS — Z23 Encounter for immunization: Secondary | ICD-10-CM

## 2019-05-17 DIAGNOSIS — K5909 Other constipation: Secondary | ICD-10-CM | POA: Diagnosis not present

## 2019-05-17 DIAGNOSIS — Z7401 Bed confinement status: Secondary | ICD-10-CM | POA: Diagnosis not present

## 2019-05-17 DIAGNOSIS — N401 Enlarged prostate with lower urinary tract symptoms: Secondary | ICD-10-CM | POA: Diagnosis not present

## 2019-05-17 DIAGNOSIS — G909 Disorder of the autonomic nervous system, unspecified: Secondary | ICD-10-CM | POA: Diagnosis present

## 2019-05-17 HISTORY — DX: Benign prostatic hyperplasia without lower urinary tract symptoms: N40.0

## 2019-05-17 LAB — URINALYSIS, COMPLETE (UACMP) WITH MICROSCOPIC
Bacteria, UA: NONE SEEN
Bilirubin Urine: NEGATIVE
Glucose, UA: NEGATIVE mg/dL
Ketones, ur: NEGATIVE mg/dL
Leukocytes,Ua: NEGATIVE
Nitrite: NEGATIVE
Protein, ur: NEGATIVE mg/dL
RBC / HPF: >50 RBC/hpf — ABNORMAL HIGH (ref 0–5)
Specific Gravity, Urine: 1.014 (ref 1.005–1.030)
pH: 7 (ref 5.0–8.0)

## 2019-05-17 LAB — CBC WITH DIFFERENTIAL/PLATELET
Abs Immature Granulocytes: 0.05 10*3/uL (ref 0.00–0.07)
Basophils Absolute: 0 10*3/uL (ref 0.0–0.1)
Basophils Relative: 0 %
Eosinophils Absolute: 0.1 10*3/uL (ref 0.0–0.5)
Eosinophils Relative: 1 %
HCT: 37.1 % — ABNORMAL LOW (ref 39.0–52.0)
Hemoglobin: 11.8 g/dL — ABNORMAL LOW (ref 13.0–17.0)
Immature Granulocytes: 1 %
Lymphocytes Relative: 19 %
Lymphs Abs: 1.9 10*3/uL (ref 0.7–4.0)
MCH: 27.2 pg (ref 26.0–34.0)
MCHC: 31.8 g/dL (ref 30.0–36.0)
MCV: 85.5 fL (ref 80.0–100.0)
Monocytes Absolute: 0.9 10*3/uL (ref 0.1–1.0)
Monocytes Relative: 9 %
Neutro Abs: 7 10*3/uL (ref 1.7–7.7)
Neutrophils Relative %: 70 %
Platelets: 271 10*3/uL (ref 150–400)
RBC: 4.34 MIL/uL (ref 4.22–5.81)
RDW: 14.1 % (ref 11.5–15.5)
WBC: 9.9 10*3/uL (ref 4.0–10.5)
nRBC: 0 % (ref 0.0–0.2)

## 2019-05-17 LAB — LACTIC ACID, PLASMA: Lactic Acid, Venous: 1.7 mmol/L (ref 0.5–1.9)

## 2019-05-17 LAB — COMPREHENSIVE METABOLIC PANEL
ALT: 5 U/L (ref 0–44)
AST: 18 U/L (ref 15–41)
Albumin: 3.3 g/dL — ABNORMAL LOW (ref 3.5–5.0)
Alkaline Phosphatase: 54 U/L (ref 38–126)
Anion gap: 9 (ref 5–15)
BUN: 20 mg/dL (ref 8–23)
CO2: 25 mmol/L (ref 22–32)
Calcium: 9.1 mg/dL (ref 8.9–10.3)
Chloride: 104 mmol/L (ref 98–111)
Creatinine, Ser: 1.54 mg/dL — ABNORMAL HIGH (ref 0.61–1.24)
GFR calc Af Amer: 50 mL/min — ABNORMAL LOW (ref >60)
GFR calc non Af Amer: 43 mL/min — ABNORMAL LOW (ref >60)
Glucose, Bld: 141 mg/dL — ABNORMAL HIGH (ref 70–99)
Potassium: 3.9 mmol/L (ref 3.5–5.1)
Sodium: 138 mmol/L (ref 135–145)
Total Bilirubin: 0.8 mg/dL (ref 0.3–1.2)
Total Protein: 7.1 g/dL (ref 6.5–8.1)

## 2019-05-17 LAB — TROPONIN I (HIGH SENSITIVITY): Troponin I (High Sensitivity): 8 ng/L (ref <18)

## 2019-05-17 MED ORDER — ENOXAPARIN SODIUM 40 MG/0.4ML ~~LOC~~ SOLN
40.0000 mg | Freq: Every day | SUBCUTANEOUS | Status: DC
  Administered 2019-05-18 – 2019-05-23 (×7): 40 mg via SUBCUTANEOUS
  Filled 2019-05-17 (×8): qty 0.4

## 2019-05-17 MED ORDER — CARBIDOPA-LEVODOPA 25-100 MG PO TABS
1.0000 | ORAL_TABLET | ORAL | Status: AC
  Administered 2019-05-17: 1 via ORAL
  Filled 2019-05-17: qty 1

## 2019-05-17 MED ORDER — ONDANSETRON HCL 4 MG/2ML IJ SOLN: 4.0000 mg | Freq: Four times a day (QID) | INTRAMUSCULAR | Status: DC | PRN

## 2019-05-17 MED ORDER — SODIUM CHLORIDE 0.9 % IV BOLUS
1000.0000 mL | Freq: Once | INTRAVENOUS | Status: AC
  Administered 2019-05-17: 1000 mL via INTRAVENOUS

## 2019-05-17 MED ORDER — ACETAMINOPHEN 325 MG PO TABS: 650.0000 mg | ORAL_TABLET | Freq: Four times a day (QID) | ORAL | Status: DC | PRN

## 2019-05-17 MED ORDER — ASPIRIN EC 81 MG PO TBEC
81.0000 mg | DELAYED_RELEASE_TABLET | ORAL | Status: DC
  Administered 2019-05-19 – 2019-05-23 (×3): 81 mg via ORAL
  Filled 2019-05-17 (×4): qty 1

## 2019-05-17 MED ORDER — ACETAMINOPHEN 650 MG RE SUPP
650.0000 mg | Freq: Four times a day (QID) | RECTAL | Status: DC | PRN
  Filled 2019-05-17: qty 1

## 2019-05-17 MED ORDER — METOPROLOL TARTRATE 25 MG PO TABS
12.5000 mg | ORAL_TABLET | Freq: Two times a day (BID) | ORAL | Status: DC
  Administered 2019-05-17 – 2019-05-18 (×2): 12.5 mg via ORAL
  Filled 2019-05-17 (×2): qty 1

## 2019-05-17 MED ORDER — ONDANSETRON HCL 4 MG PO TABS
4.0000 mg | ORAL_TABLET | Freq: Four times a day (QID) | ORAL | Status: DC | PRN
  Filled 2019-05-17: qty 1

## 2019-05-17 MED ORDER — LABETALOL HCL 5 MG/ML IV SOLN
20.0000 mg | Freq: Once | INTRAVENOUS | Status: AC
  Administered 2019-05-17: 20 mg via INTRAVENOUS
  Filled 2019-05-17: qty 4

## 2019-05-17 MED ORDER — METOPROLOL TARTRATE 25 MG PO TABS: 12.5000 mg | ORAL_TABLET | Freq: Two times a day (BID) | ORAL | Status: DC
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
## 2019-05-17 NOTE — Telephone Encounter (Signed)
Patient was discharged home from [facility] on 05/16/2019 A referral was placed upon d/c for Palliative Care Services.  Langley Gauss is requesting a call back with verbal orders to okay started of services.   Please advise.

## 2019-05-17 NOTE — ED Notes (Signed)
Applied condom catheter and instructed patient on use. Patient provided with TV remote, notified that we are still waiting on beds in the hospital. Bed alarm placed

## 2019-05-17 NOTE — ED Triage Notes (Signed)
Patient was recently admitted to [facility]. Wife brought patient back due to AMS. She reports when he was brought down for discharge he had to be picked up and put in the car and his baseline is ambulatory with cane/walker short distances. HX stage four parkinsons. Baseline alert to self, time and situation. At this time patient is disoriented X4 and lethargic

## 2019-05-17 NOTE — Telephone Encounter (Signed)
Fwd to Dr. Damita Dunnings to address in Dr. Synthia Innocent absence.

## 2019-05-17 NOTE — Telephone Encounter (Signed)
I saw palliative care discussed in the inpatient notes.  Please give the order, assuming the patient still consents to this.  Thanks.

## 2019-05-17 NOTE — ED Notes (Addendum)
First RN Note: Pt presents to ED via [facility] from home. Per EMS pt's wife called EMS due to wanting patient being placed at [facility]. EMS reports that per [facility] and the wife was instructed to have patient admitted through the ER and it would be faster to have him placed at [facility] due to their registration person being out since Tuesday. EMS reports that patient's wife is unable to manage patient at home due to mobility problems.    99/56 87 100% RA 98 CBG 181   HX enlarged prostate, HTN, stage 4 parkinsons

## 2019-05-17 NOTE — ED Notes (Signed)
Linens changed d/t urine soaked. New brief placed on pt. Urinal at bedside.

## 2019-05-17 NOTE — Telephone Encounter (Signed)
Attempted several times to return Denise's call.  Line busy. Will try again tomorrow.
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
## 2019-05-18 LAB — URINE CULTURE

## 2019-05-18 LAB — CBC
HCT: 36.2 % — ABNORMAL LOW (ref 39.0–52.0)
Hemoglobin: 11.9 g/dL — ABNORMAL LOW (ref 13.0–17.0)
MCH: 27.2 pg (ref 26.0–34.0)
MCHC: 32.9 g/dL (ref 30.0–36.0)
MCV: 82.6 fL (ref 80.0–100.0)
Platelets: 275 10*3/uL (ref 150–400)
RBC: 4.38 MIL/uL (ref 4.22–5.81)
RDW: 14.2 % (ref 11.5–15.5)
WBC: 11.8 10*3/uL — ABNORMAL HIGH (ref 4.0–10.5)
nRBC: 0 % (ref 0.0–0.2)

## 2019-05-18 LAB — BASIC METABOLIC PANEL
Anion gap: 11 (ref 5–15)
BUN: 21 mg/dL (ref 8–23)
CO2: 21 mmol/L — ABNORMAL LOW (ref 22–32)
Calcium: 8.8 mg/dL — ABNORMAL LOW (ref 8.9–10.3)
Chloride: 109 mmol/L (ref 98–111)
Creatinine, Ser: 1.34 mg/dL — ABNORMAL HIGH (ref 0.61–1.24)
GFR calc Af Amer: 60 mL/min — ABNORMAL LOW (ref >60)
GFR calc non Af Amer: 51 mL/min — ABNORMAL LOW (ref >60)
Glucose, Bld: 122 mg/dL — ABNORMAL HIGH (ref 70–99)
Potassium: 4.1 mmol/L (ref 3.5–5.1)
Sodium: 141 mmol/L (ref 135–145)

## 2019-05-18 LAB — SARS CORONAVIRUS 2 (TAT 6-24 HRS): SARS Coronavirus 2: NEGATIVE

## 2019-05-18 MED ORDER — AMLODIPINE BESYLATE 5 MG PO TABS
5.0000 mg | ORAL_TABLET | Freq: Every day | ORAL | Status: DC
  Administered 2019-05-18 – 2019-05-19 (×2): 5 mg via ORAL
  Filled 2019-05-18 (×2): qty 1

## 2019-05-18 MED ORDER — NIFEDIPINE 10 MG PO CAPS: 10.0000 mg | ORAL_CAPSULE | Freq: Three times a day (TID) | ORAL | Status: DC

## 2019-05-18 MED ORDER — CARBIDOPA-LEVODOPA 25-100 MG PO TABS
1.0000 | ORAL_TABLET | Freq: Every day | ORAL | Status: DC
  Administered 2019-05-18 – 2019-05-24 (×7): 1 via ORAL
  Filled 2019-05-18 (×7): qty 1

## 2019-05-18 MED ORDER — METOPROLOL TARTRATE 5 MG/5ML IV SOLN
5.0000 mg | INTRAVENOUS | Status: DC | PRN
  Administered 2019-05-18 – 2019-05-19 (×3): 5 mg via INTRAVENOUS
  Filled 2019-05-18 (×4): qty 5

## 2019-05-18 MED ORDER — METOPROLOL TARTRATE 25 MG PO TABS
25.0000 mg | ORAL_TABLET | Freq: Two times a day (BID) | ORAL | Status: DC
  Administered 2019-05-18 – 2019-05-24 (×12): 25 mg via ORAL
  Filled 2019-05-18 (×8): qty 1
  Filled 2019-05-18: qty 0.5
  Filled 2019-05-18 (×4): qty 1

## 2019-05-18 MED ORDER — PNEUMOCOCCAL VAC POLYVALENT 25 MCG/0.5ML IJ INJ
0.5000 mL | INJECTION | INTRAMUSCULAR | Status: AC
  Administered 2019-05-19: 0.5 mL via INTRAMUSCULAR
  Filled 2019-05-18: qty 0.5

## 2019-05-18 MED ORDER — METOPROLOL TARTRATE 25 MG PO TABS
12.5000 mg | ORAL_TABLET | Freq: Once | ORAL | Status: AC
  Administered 2019-05-18: 16:00:00 12.5 mg via ORAL
  Filled 2019-05-18: qty 1

## 2019-05-18 MED ORDER — INFLUENZA VAC A&B SA ADJ QUAD 0.5 ML IM PRSY
0.5000 mL | PREFILLED_SYRINGE | INTRAMUSCULAR | Status: AC
  Administered 2019-05-19: 11:00:00 0.5 mL via INTRAMUSCULAR
  Filled 2019-05-18: qty 0.5

## 2019-05-18 NOTE — ED Notes (Signed)
Lab at bedside

## 2019-05-18 NOTE — Telephone Encounter (Addendum)
Again attempted several times to contact phn # provided for Brandon Manning, still busy.  Called [facility] main office to try speak with Langley Gauss.  Told they will need to call back.  Need to inform them Dr. Damita Dunnings (in Dr. Synthia Innocent absence) is giving verbal orders to start Palliative care services for pt.
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
## 2019-05-18 NOTE — ED Notes (Signed)
Meal tray provided. Pt feeding self.

## 2019-05-18 NOTE — ED Notes (Signed)
Patient resting, NAD. Patient has no requests. VS changed to every 1 hr

## 2019-05-18 NOTE — ED Notes (Signed)
Phlebotomy aware of need for lab work
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
## 2019-05-18 NOTE — Telephone Encounter (Signed)
Noted  

## 2019-05-18 NOTE — ED Notes (Signed)
Lab unable to draw tubes, will finger stick for AM labs

## 2019-05-18 NOTE — ED Notes (Signed)
Pt given remote for TV- no complaints at this time

## 2019-05-18 NOTE — ED Notes (Signed)
Attempted to call report, unable to at this time.

## 2019-05-18 NOTE — ED Notes (Signed)
Brandon Beach, RN at bedside, pt attempted a BM but reported he did not have to anymore. Pts depends changed. Family updated to the best of this RNs ability.

## 2019-05-18 NOTE — ED Notes (Signed)
Wife brought pt lunch and assisted in eating.

## 2019-05-18 NOTE — Progress Notes (Signed)
[place] at [place] NAME: Brandon Manning    MR#:  [id number]  DATE OF BIRTH:  [birth year]  SUBJECTIVE:  CHIEF COMPLAINT: Patient is concerned about his fluctuating blood pressures, mentating almost at his baseline during my examination  REVIEW OF SYSTEMS:  CONSTITUTIONAL: No fever, reporting weakness.  EYES: No blurred or double vision.  EARS, NOSE, AND THROAT: No tinnitus or ear pain.  RESPIRATORY: No cough, shortness of breath, wheezing or hemoptysis.  CARDIOVASCULAR: No chest pain, orthopnea, edema.  GASTROINTESTINAL: No nausea, vomiting, diarrhea or abdominal pain.  SKIN: No rash or lesion. MUSCULOSKELETAL: No joint pain or arthritis.   NEUROLOGIC: No tingling, numbness, weakness.  PSYCHIATRY: No anxiety or depression.   DRUG ALLERGIES:   Allergies  Allergen Reactions  . Penicillins Rash    Has patient had a PCN reaction causing immediate rash, facial/tongue/throat swelling, SOB or lightheadedness with hypotension: No Has patient had a PCN reaction causing severe rash involving mucus membranes or skin necrosis: No Has patient had a PCN reaction that required hospitalization No Has patient had a PCN reaction occurring within the last 10 years: No If all of the above answers are "NO", then may proceed with Cephalosporin use.    VITALS:  Blood pressure (!) 173/94, pulse 75, temperature 98 F (36.7 C), temperature source Oral, resp. rate 16, height 5\' 10"  (1.778 m), weight 82.8 kg, SpO2 100 %.  PHYSICAL EXAMINATION:  GENERAL:  73 y.o.-year-old patient lying in the bed with no acute distress.  EYES: Pupils equal, round, reactive to light and accommodation. No scleral icterus. Extraocular muscles intact.  HEENT: Head atraumatic, normocephalic. Oropharynx and nasopharynx clear.  NECK:  Supple, no jugular venous distention. No thyroid enlargement, no tenderness.  LUNGS: Normal breath sounds bilaterally, no wheezing, rales,rhonchi  or crepitation. No use of accessory muscles of respiration.  CARDIOVASCULAR: S1, S2 normal. No murmurs, rubs, or gallops.  ABDOMEN: Soft, nontender, nondistended. Bowel sounds present. EXTREMITIES: No pedal edema, cyanosis, or clubbing.  NEUROLOGIC: Cranial nerves II through XII are intact. Muscle strength weak in all extremities. Sensation intact. Gait not checked.  PSYCHIATRIC: The patient is alert and oriented x 2-3.  SKIN: No obvious rash, lesion, or ulcer.    LABORATORY PANEL:   CBC Recent Labs  Lab 05/18/19 0551  WBC 11.8*  HGB 11.9*  HCT 36.2*  PLT 275   ------------------------------------------------------------------------------------------------------------------  Chemistries  Recent Labs  Lab 05/14/19 0400  05/17/19 1642 05/18/19 0551  NA 143   < > 138 141  K 3.7   < > 3.9 4.1  CL 109   < > 104 109  CO2 26   < > 25 21*  GLUCOSE 126*   < > 141* 122*  BUN 24*   < > 20 21  CREATININE 1.28*   < > 1.54* 1.34*  CALCIUM 8.8*   < > 9.1 8.8*  MG 1.9  --   --   --   AST 14*  --  18  --   ALT 10  --  5  --   ALKPHOS 49  --  54  --   BILITOT 1.3*  --  0.8  --    < > = values in this interval not displayed.   ------------------------------------------------------------------------------------------------------------------  Cardiac Enzymes No results for input(s): TROPONINI in the last 168 hours. ------------------------------------------------------------------------------------------------------------------  RADIOLOGY:  No results found.  EKG:   Orders placed or performed during the hospital encounter of 05/17/19  .  EKG 12-Lead  . EKG 12-Lead  . ED EKG  . ED EKG    ASSESSMENT AND PLAN:   Autonomic dysfunction with fluctuating blood pressures-blood pressure is elevated at this time Metoprolol dose increased to 25mg  p.o. twice daily Hold midodrine for now Get echocardiogram Follow-up with cardiology, appreciate their recommendations Neurology consult  placed  AKI-gentle hydration with IV fluids Check a.m. labs  Parkinson's disease continue Sinemet.  Neurology consult to Dr. Doy Mince  Generalized weakness physical therapy assessment   DVT prophylaxis    All the records are reviewed and case discussed with Care Management/Social Workerr. Management plans discussed with the patient, family and they are in agreement.  CODE STATUS:FC  TOTAL TIME TAKING CARE OF THIS PATIENT: 35 minutes.   POSSIBLE D/C IN 2  DAYS, DEPENDING ON CLINICAL CONDITION.  Note: This dictation was prepared with Dragon dictation along with smaller phrase technology. Any transcriptional errors that result from this process are unintentional.   Nicholes Mango M.D on 05/18/2019 at 1:44 PM  Between 7am to 6pm - Pager - [phone] After 6pm go to [email or web address] - password [email or web address] [facility]  [facility]  Office  [phone]  CC: Primary care physician; [name], MD

## 2019-05-18 NOTE — ED Notes (Signed)
Attempted to draw labs off existing IV, no blood return. Attempted to place another IV without success. Another RN will attempt, phlebotomy called

## 2019-05-18 NOTE — Telephone Encounter (Signed)
Brandon Manning returned phone call - I gave verbal orders for palliative care services. Thanks!

## 2019-05-19 ENCOUNTER — Inpatient Hospital Stay
Admit: 2019-05-19 | Discharge: 2019-05-19 | Disposition: A | Discharge status: Home / Self Care | Payer: Medicare HMO | Attending: Internal Medicine | Admitting: Internal Medicine

## 2019-05-19 DIAGNOSIS — G20 Parkinson's disease: Secondary | ICD-10-CM

## 2019-05-19 DIAGNOSIS — G909 Disorder of the autonomic nervous system, unspecified: Principal | ICD-10-CM

## 2019-05-19 LAB — BASIC METABOLIC PANEL
Anion gap: 10 (ref 5–15)
BUN: 23 mg/dL (ref 8–23)
CO2: 24 mmol/L (ref 22–32)
Calcium: 9.1 mg/dL (ref 8.9–10.3)
Chloride: 107 mmol/L (ref 98–111)
Creatinine, Ser: 1.08 mg/dL (ref 0.61–1.24)
GFR calc Af Amer: >60 mL/min (ref >60)
GFR calc non Af Amer: >60 mL/min (ref >60)
Glucose, Bld: 138 mg/dL — ABNORMAL HIGH (ref 70–99)
Potassium: 3.9 mmol/L (ref 3.5–5.1)
Sodium: 141 mmol/L (ref 135–145)

## 2019-05-19 LAB — CBC
HCT: 37.2 % — ABNORMAL LOW (ref 39.0–52.0)
Hemoglobin: 12.1 g/dL — ABNORMAL LOW (ref 13.0–17.0)
MCH: 26.8 pg (ref 26.0–34.0)
MCHC: 32.5 g/dL (ref 30.0–36.0)
MCV: 82.3 fL (ref 80.0–100.0)
Platelets: 284 10*3/uL (ref 150–400)
RBC: 4.52 MIL/uL (ref 4.22–5.81)
RDW: 13.9 % (ref 11.5–15.5)
WBC: 9 10*3/uL (ref 4.0–10.5)
nRBC: 0 % (ref 0.0–0.2)

## 2019-05-19 LAB — ECHOCARDIOGRAM COMPLETE
Height: 70 in
Weight: 2871.27 oz

## 2019-05-19 MED ORDER — AMLODIPINE BESYLATE 5 MG PO TABS
2.5000 mg | ORAL_TABLET | Freq: Every day | ORAL | Status: DC
  Administered 2019-05-20 – 2019-05-24 (×5): 2.5 mg via ORAL
  Filled 2019-05-19 (×5): qty 1

## 2019-05-19 NOTE — Progress Notes (Signed)
Pt BP was at 186/65 HR 74. Notify prime and talked to Dr. Marcille Blanco states to go ahead and give metropolol 25 mg tablet and 5 mg norvasc scheduled at 1000 to give now. Will notify incoming shit. Will continue to monitor.

## 2019-05-19 NOTE — Progress Notes (Signed)
[place] at [place] NAME: Brandon Manning    MR#:  [id number]  DATE OF BIRTH:  02/02/1946  SUBJECTIVE:  CHIEF COMPLAINT: Patient is concerned about his fluctuating blood pressures, denies any dizziness while resting   REVIEW OF SYSTEMS:  CONSTITUTIONAL: No fever, reporting weakness.  EYES: No blurred or double vision.  EARS, NOSE, AND THROAT: No tinnitus or ear pain.  RESPIRATORY: No cough, shortness of breath, wheezing or hemoptysis.  CARDIOVASCULAR: No chest pain, orthopnea, edema.  GASTROINTESTINAL: No nausea, vomiting, diarrhea or abdominal pain.  SKIN: No rash or lesion. MUSCULOSKELETAL: No joint pain or arthritis.   NEUROLOGIC: No tingling, numbness, weakness.  PSYCHIATRY: No anxiety or depression.   DRUG ALLERGIES:   Allergies  Allergen Reactions  . Penicillins Rash    Has patient had a PCN reaction causing immediate rash, facial/tongue/throat swelling, SOB or lightheadedness with hypotension: No Has patient had a PCN reaction causing severe rash involving mucus membranes or skin necrosis: No Has patient had a PCN reaction that required hospitalization No Has patient had a PCN reaction occurring within the last 10 years: No If all of the above answers are "NO", then may proceed with Cephalosporin use.    VITALS:  Blood pressure 100/72, pulse 83, temperature 98.5 F (36.9 C), temperature source Oral, resp. rate 18, height 5\' 10"  (1.778 m), weight 81.4 kg, SpO2 100 %.  PHYSICAL EXAMINATION:  GENERAL:  73 y.o.-year-old patient lying in the bed with no acute distress.  EYES: Pupils equal, round, reactive to light and accommodation. No scleral icterus. Extraocular muscles intact.  HEENT: Head atraumatic, normocephalic. Oropharynx and nasopharynx clear.  NECK:  Supple, no jugular venous distention. No thyroid enlargement, no tenderness.  LUNGS: Normal breath sounds bilaterally, no wheezing, rales,rhonchi or crepitation. No  use of accessory muscles of respiration.  CARDIOVASCULAR: S1, S2 normal. No murmurs, rubs, or gallops.  ABDOMEN: Soft, nontender, nondistended. Bowel sounds present. EXTREMITIES: No pedal edema, cyanosis, or clubbing.  NEUROLOGIC: Cranial nerves II through XII are intact. Muscle strength weak in all extremities. Sensation intact. Gait not checked.  PSYCHIATRIC: The patient is alert and oriented x 2-3.  SKIN: No obvious rash, lesion, or ulcer.    LABORATORY PANEL:   CBC Recent Labs  Lab 05/19/19 0625  WBC 9.0  HGB 12.1*  HCT 37.2*  PLT 284   ------------------------------------------------------------------------------------------------------------------  Chemistries  Recent Labs  Lab 05/14/19 0400  05/17/19 1642  05/19/19 0625  NA 143   < > 138   < > 141  K 3.7   < > 3.9   < > 3.9  CL 109   < > 104   < > 107  CO2 26   < > 25   < > 24  GLUCOSE 126*   < > 141*   < > 138*  BUN 24*   < > 20   < > 23  CREATININE 1.28*   < > 1.54*   < > 1.08  CALCIUM 8.8*   < > 9.1   < > 9.1  MG 1.9  --   --   --   --   AST 14*  --  18  --   --   ALT 10  --  5  --   --   ALKPHOS 49  --  54  --   --   BILITOT 1.3*  --  0.8  --   --    < > =  values in this interval not displayed.   ------------------------------------------------------------------------------------------------------------------  Cardiac Enzymes No results for input(s): TROPONINI in the last 168 hours. ------------------------------------------------------------------------------------------------------------------  RADIOLOGY:  No results found.  EKG:   Orders placed or performed during the hospital encounter of 05/17/19  . EKG 12-Lead  . EKG 12-Lead  . ED EKG  . ED EKG    ASSESSMENT AND PLAN:   Autonomic dysfunction with fluctuating blood pressures-blood pressure is elevated at this time Metoprolol dose increased to 25mg  p.o. twice daily On small dose amlodipine 2.5 mg Hold midodrine for now Get  echocardiogram Follow-up with cardiology, appreciate their recommendations Neurology is following Check orthostatics  AKI-gentle hydration with IV fluids Clinically better.  Creatinine at 1.08  Parkinson's disease continue Sinemet.  Neurology  Dr. Doy Mince has seen the patient.  No need to adjust the Parkinson medications at this time Recommending PT OT evaluation Teds placed to help with orthostasis so patient may tolerate standing and therapy better  Generalized weakness physical therapy assessment   DVT prophylaxis    All the records are reviewed and case discussed with Care Management/Social Workerr. Management plans discussed with the patient, wife  and they are in agreement.  CODE STATUS:FC  TOTAL TIME TAKING CARE OF THIS PATIENT: 35 minutes.   POSSIBLE D/C IN 1-2  DAYS, DEPENDING ON CLINICAL CONDITION.  Note: This dictation was prepared with Dragon dictation along with smaller phrase technology. Any transcriptional errors that result from this process are unintentional.   Nicholes Mango M.D on 05/19/2019 at 12:52 PM  Between 7am to 6pm - Pager - [phone] After 6pm go to [email or web address] - password [email or web address] [facility]  [facility]  Office  [phone]  CC: Primary care physician; [name], MD

## 2019-05-19 NOTE — Discharge Summary (Signed)
[facility] [place] NAME: Brandon Manning    MR#:  [id number]  DATE OF BIRTH:  [birth year]  DATE OF ADMISSION:  05/13/2019   ADMITTING PHYSICIAN: Lang Snow, NP  DATE OF DISCHARGE: 05/16/2019  2:06 PM  PRIMARY CARE PHYSICIAN: [name], MD   ADMISSION DIAGNOSIS:  Hypocalcemia [E83.51] Syncope, unspecified syncope type [R55] DISCHARGE DIAGNOSIS:  Active Problems:   Syncope  SECONDARY DIAGNOSIS:   Past Medical History:  Diagnosis Date  . Constipation   . ED (erectile dysfunction)   . Enlarged prostate   . History of chicken pox   . HTN (hypertension) 09/09/2011  . Parkinson disease ([place]) 02/01/2014   Has been prescribed rollator walker Established with Dr Tat (01/2016)    HOSPITAL COURSE:  73 y.o.malewith past medical history of hypertension, neurogenic orthostatic hypotension, Parkinson's disease, Parkinson's related dementia and constipation presenting to the ED with syncope.  1.Syncope- secondary to orthostatic hypotension from AKI withknownhx ofneurogenic orthostatic hypotensionr/tParkinson's disease. Orthostaticlikely worsenedby dehydrationfrom diureticdrugs,recently started on hydrochlorothiazide, according to primary care physician patient is supposed to take hydrochlorothiazide as needed but patient has been taking it regularly discontinue HCTZ -- CXRshows no active cardiopulmonary process - Orthostatic Vital Signs-positive - Head CTnegative for acute intracranial abnormality  2.Neurogenic orthostatic hypotension-admitted with syncopeJuly, 2018,and again in October, 2018 -Follows with movement disorder specialist Dr. Carles Collet  3.Idiopathic parkinsonism -Continue carbidopa levodopa25/100 mg 2 tabs in the am, 2 tabs in the afternoon and 1 tab in the evening  4.Hypertension-controlled  5. 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  Vitamin D deficiency - vitamin D supplements  DISCHARGE CONDITIONS:  Stable CONSULTS OBTAINED:   DRUG ALLERGIES:   Allergies  Allergen Reactions  . Penicillins Rash    Has patient had a PCN reaction causing immediate rash, facial/tongue/throat swelling, SOB or lightheadedness with hypotension: No Has patient had a PCN reaction causing severe rash involving mucus membranes or skin necrosis: No Has patient had a PCN reaction that required hospitalization No Has patient had a PCN reaction occurring within the last 10 years: No If all of the above answers are "NO", then may proceed with Cephalosporin use.   DISCHARGE MEDICATIONS:   Allergies as of 05/16/2019      Reactions   Penicillins Rash   Has patient had a PCN reaction causing immediate rash, facial/tongue/throat swelling, SOB or lightheadedness with hypotension: No Has patient had a PCN reaction causing severe rash involving mucus membranes or skin necrosis: No Has patient had a PCN reaction that required hospitalization No Has patient had a PCN reaction occurring within the last 10 years: No If all of the above answers are "NO", then may proceed with Cephalosporin use.      Medication List    STOP taking these medications   finasteride 5 MG tablet Commonly known as: PROSCAR   hydrochlorothiazide 12.5 MG capsule Commonly known as: MICROZIDE   HYDROcodone-acetaminophen 5-325 MG tablet Commonly known as: NORCO/VICODIN   polyethylene glycol powder 17 GM/SCOOP powder Commonly known as: MiraLax   potassium chloride 10 MEQ tablet Commonly known as: KLOR-CON     TAKE these medications   aspirin EC 81 MG tablet Take 81 mg by mouth every other day.   B-12 1000 MCG Subl Place 1 tablet under the tongue daily.   carbidopa-levodopa  25-100 MG tablet Commonly known as: SINEMET IR 2 in the  7am, 2 at noon, 1 at 4pm   metoprolol tartrate 25 MG tablet Commonly known as: LOPRESSOR Take 0.5 tablets (12.5 mg total) by mouth 2 (two) times daily.   midodrine 5 MG tablet Commonly known as: PROAMATINE Take 0.5 tablets (2.5 mg total) by mouth 3 (three) times daily with meals.   mirabegron ER 25 MG Tb24 tablet Commonly known as: Myrbetriq Take 1 tablet (25 mg total) by mouth daily.   MULTIVITAMIN PO Take 1 tablet by mouth daily.   Vitamin D 50 MCG (2000 UT) Caps Take 1 capsule (2,000 Units total) by mouth daily.        DISCHARGE INSTRUCTIONS:   DIET:  Cardiac diet DISCHARGE CONDITION:  Fair ACTIVITY:  Activity as tolerated OXYGEN:  Home Oxygen: No.  Oxygen Delivery: room air DISCHARGE LOCATION:  home with home health PT  If you experience worsening of your admission symptoms, develop shortness of breath, life threatening emergency, suicidal or homicidal thoughts you must seek medical attention immediately by calling 911 or calling your MD immediately  if symptoms less severe.  You Must read complete instructions/literature along with all the possible adverse reactions/side effects for all the Medicines you take and that have been prescribed to you. Take any new Medicines after you have completely understood and accpet all the possible adverse reactions/side effects.   Please note  You were cared for by a hospitalist during your hospital stay. If you have any questions about your discharge medications or the care you received while you were in the hospital after you are discharged, you can call the unit and asked to speak with the hospitalist on call if the hospitalist that took care of you is not available. Once you are discharged, your primary care physician will handle any further medical issues. Please note that NO REFILLS for any discharge medications will be authorized once you are discharged, as it is imperative that you return to your primary care physician (or  establish a relationship with a primary care physician if you do not have one) for your aftercare needs so that they can reassess your need for medications and monitor your lab values.    On the day of Discharge:  VITAL SIGNS:  Blood pressure (!) 180/104, pulse 68, temperature 98.3 F (36.8 C), resp. rate 19, height 5\' 11"  (1.803 m), weight 82.8 kg, SpO2 100 %. PHYSICAL EXAMINATION:  GENERAL:  73 y.o.-year-old patient lying in the bed with no acute distress.  EYES: Pupils equal, round, reactive to light and accommodation. No scleral icterus. Extraocular muscles intact.  HEENT: Head atraumatic, normocephalic. Oropharynx and nasopharynx clear.  NECK:  Supple, no jugular venous distention. No thyroid enlargement, no tenderness.  LUNGS: Normal breath sounds bilaterally, no wheezing, rales,rhonchi or crepitation. No use of accessory muscles of respiration.  CARDIOVASCULAR: S1, S2 normal. No murmurs, rubs, or gallops.  ABDOMEN: Soft, non-tender, non-distended. Bowel sounds present. No organomegaly or mass.  EXTREMITIES: No pedal edema, cyanosis, or clubbing.  NEUROLOGIC: Cranial nerves II through XII are intact. Muscle strength 5/5 in all extremities. Sensation intact. Gait not checked.  PSYCHIATRIC: The patient is alert and oriented x 3.  SKIN: No obvious rash, lesion, or ulcer.  DATA REVIEW:   CBC Recent Labs  Lab 05/19/19 0625  WBC 9.0  HGB 12.1*  HCT 37.2*  PLT 284    Chemistries  Recent Labs  Lab 05/14/19 0400  05/17/19 1642  05/19/19 [id number]  NA 143   < > 138   < > 141  K 3.7   < > 3.9   < > 3.9  CL 109   < > 104   < > 107  CO2 26   < > 25   < > 24  GLUCOSE 126*   < > 141*   < > 138*  BUN 24*   < > 20   < > 23  CREATININE 1.28*   < > 1.54*   < > 1.08  CALCIUM 8.8*   < > 9.1   < > 9.1  MG 1.9  --   --   --   --   AST 14*  --  18  --   --   ALT 10  --  5  --   --   ALKPHOS 49  --  54  --   --   BILITOT 1.3*  --  0.8  --   --    < > = values in this interval not  displayed.     Follow-up Information    [name], MD. Go on 05/24/2019.   Specialty: Family Medicine Why: Appointment at 12pm Contact information: [address] [place] [address] [phone]          Remains at high risk for readmission  Management plans discussed with the patient, family and they are in agreement.  CODE STATUS: Full Code   TOTAL TIME TAKING CARE OF THIS PATIENT: 45 minutes.    Max Sane M.D on 05/19/2019 at 2:14 PM  Between 7am to 6pm - Pager - [phone]  After 6pm go to [email or web address] - Proofreader  [facility]  Office  [phone]  CC: Primary care physician; [name], MD   Note: This dictation was prepared with Dragon dictation along with smaller phrase technology. Any transcriptional errors that result from this process are unintentional.

## 2019-05-19 NOTE — Evaluation (Signed)
Physical Therapy Evaluation Patient Details Name: Brandon Manning MRN: [id number] DOB: [birth year] Today's Date: 05/19/2019   History of Present Illness  Pt is a 73 year old male re-admitted 24 hrs after discharging from the hospital with c/o weakness and difficulty transferring.  PMH is PD and Htn.  Clinical Impression  Pt is a 73 year old male who lives in a one story home with his wife.  Pt was independent with household mobility prior to recent hospital admission, but has been unable to complete transfers without Max A since he discharged.  Pt in bed with wife at bedside when PT arrived.  He was alert and oriented to self but became confused during some conversation; pt is also HOH.  He required Mod A for bed mobility and demonstrated poor posture when sitting at bedside.  He was eventually able to sit upright and avoid R side lean.  Pt required +2 Mod/Max A for safety during pivot transfer, using sliding steps and getting "stuck" mid transfer.  Pt presented with fair overall strength and demonstrated tremors in extremities following transfer.  He reported feeling "dizzy" but BP was WNL once obtained, pt had already been placed in supine position in recliner.  Pt will continue to benefit from skilled PT with focus on strength, safe functional mobility and balance.  Due to decline in mobility and concern for caregiver support, pt will benefit from SNF placement following discharge.    Follow Up Recommendations SNF    Equipment Recommendations  None recommended by PT    Recommendations for Other Services       Precautions / Restrictions Precautions Precautions: Fall Precaution Comments: High Fall Restrictions Weight Bearing Restrictions: No      Mobility  Bed Mobility Overal bed mobility: Needs Assistance Bed Mobility: Supine to Sit     Supine to sit: Mod assist;HOB elevated     General bed mobility comments: Hand held assist to bring trunk upright and assistance to bring LE's  over EOB and scoot forward.  Transfers Overall transfer level: Needs assistance Equipment used: Rolling walker (2 wheeled) Transfers: Stand Pivot Transfers   Stand pivot transfers: From elevated surface;Mod assist;Max assist;+2 safety/equipment       General transfer comment: Rose from bed slowly with PT assisting to steady, able to advance L LE but difficulty in moving R LE.  Moves feet in sliding motion, became "stuck" halfway through transfer and PT guided pt to chair before he could sit down between chair and bed.  Pt's wife present for safety.  Ambulation/Gait                Stairs            Wheelchair Mobility    Modified Rankin (Stroke Patients Only)       Balance Overall balance assessment: Needs assistance;Mild deficits observed, not formally tested Sitting-balance support: Feet supported Sitting balance-Leahy Scale: Fair   Postural control: Right lateral lean Standing balance support: Bilateral upper extremity supported;During functional activity Standing balance-Leahy Scale: Poor Standing balance comment: reliant on UE support from RW, requires +2 assist for transfers.                             Pertinent Vitals/Pain Pain Assessment: No/denies pain    Home Living Family/patient expects to be discharged to:: Skilled nursing facility Living Arrangements: Spouse/significant other Available Help at Discharge: Family;Available 24 hours/day(Wife available 24/7. Per wife, son is also nearby to  assist PRN) Type of Home: House Home Access: Level entry     Home Layout: One level Home Equipment: Clinical cytogeneticist - 4 wheels;Cane - single point;Grab bars - tub/shower;Hand held shower head;Wheelchair - manual      Prior Function Level of Independence: Independent with assistive device(s);Needs assistance   Gait / Transfers Assistance Needed: Caregiver reports pt ambulates with SPC for HH distances, and uses a 2WW for community distances. Is  not leaving the house much 2/2 COVID  ADL's / Homemaking Assistance Needed: Per caregiver, pt is able to dress and feed himself. He requires smoe assistance with showering. Wife manages IADLs including driving, mediation management, cleaning, etc.        Hand Dominance   Dominant Hand: Left    Extremity/Trunk Assessment   Upper Extremity Assessment Upper Extremity Assessment: Generalized weakness    Lower Extremity Assessment Lower Extremity Assessment: Generalized weakness    Cervical / Trunk Assessment Cervical / Trunk Assessment: Kyphotic  Communication   Communication: HOH  Cognition Arousal/Alertness: Awake/alert Behavior During Therapy: WFL for tasks assessed/performed Overall Cognitive Status: History of cognitive impairments - at baseline                                        General Comments      Exercises Other Exercises Other Exercises: Time to monitor vitals and educate wife and pt about best body mechanics during transfers. x8 min   Assessment/Plan    PT Assessment Patient needs continued PT services  PT Problem List Decreased strength;Decreased mobility;Decreased safety awareness;Decreased activity tolerance;Decreased balance;Decreased knowledge of use of DME;Decreased cognition;Decreased range of motion       PT Treatment Interventions DME instruction;Therapeutic exercise;Gait training;Balance training;Stair training;Neuromuscular re-education;Functional mobility training;Therapeutic activities;Patient/family education    PT Goals (Current goals can be found in the Care Plan section)  Acute Rehab PT Goals Patient Stated Goal: to be able to transfer independently. PT Goal Formulation: With patient/family Time For Goal Achievement: 06/02/19 Potential to Achieve Goals: Good    Frequency Min 2X/week   Barriers to discharge        Co-evaluation               AM-PAC PT "6 Clicks" Mobility  Outcome Measure Help needed  turning from your back to your side while in a flat bed without using bedrails?: A Little Help needed moving from lying on your back to sitting on the side of a flat bed without using bedrails?: A Little Help needed moving to and from a bed to a chair (including a wheelchair)?: A Lot Help needed standing up from a chair using your arms (e.g., wheelchair or bedside chair)?: A Lot Help needed to walk in hospital room?: A Lot Help needed climbing 3-5 steps with a railing? : A Lot 6 Click Score: 14    End of Session Equipment Utilized During Treatment: Gait belt Activity Tolerance: Patient tolerated treatment well Patient left: with call bell/phone within reach;in chair;with chair alarm set;with family/visitor present Nurse Communication: Mobility status PT Visit Diagnosis: Muscle weakness (generalized) (M62.81);Other abnormalities of gait and mobility (R26.89);Difficulty in walking, not elsewhere classified (R26.2)    Time: [id number] PT Time Calculation (min) (ACUTE ONLY): 25 min   Charges:   PT Evaluation $PT Eval Low Complexity: 1 Low PT Treatments $Therapeutic Activity: 8-22 mins        {Sandie Swayze Blenda Mounts, PT, DPT   Brandon Manning  05/19/2019, 1:26 PM
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
## 2019-05-19 NOTE — Progress Notes (Signed)
*  PRELIMINARY RESULTS* Echocardiogram 2D Echocardiogram has been performed.  Brandon Manning [name] 05/19/2019, 9:28 AM

## 2019-05-20 DIAGNOSIS — K5909 Other constipation: Secondary | ICD-10-CM

## 2019-05-20 DIAGNOSIS — N401 Enlarged prostate with lower urinary tract symptoms: Secondary | ICD-10-CM

## 2019-05-20 DIAGNOSIS — R351 Nocturia: Secondary | ICD-10-CM

## 2019-05-20 DIAGNOSIS — I10 Essential (primary) hypertension: Secondary | ICD-10-CM

## 2019-05-20 DIAGNOSIS — N179 Acute kidney failure, unspecified: Secondary | ICD-10-CM

## 2019-05-20 NOTE — NC FL2 (Signed)
[facility] LEVEL OF CARE SCREENING TOOL     IDENTIFICATION  Patient Name: Brandon Manning Birthdate: [birth year] Sex: male Admission Date (Current Location): 05/17/2019  [place] and [place] Number:  Engineering geologist and Address:  [facility], [address] [place], [place] [address]      Provider Number: [id number]  Attending Physician Name and Address:  Marcell Anger*  Relative Name and Phone Number:  Klein, Stablein [phone]  [phone]    Current Level of Care: Hospital Recommended Level of Care: [place] Prior Approval Number:    Date Approved/Denied:   PASRR Number: [id number]  Discharge Plan: Home    Current Diagnoses: Patient Active Problem List   Diagnosis Date Noted  . Autonomic dysfunction 05/17/2019  . Syncope 05/13/2019  . Pedal edema 04/16/2019  . Vitamin D deficiency 02/02/2019  . Vitamin B12 deficiency 05/10/2018  . Urge incontinence 03/16/2018  . Hematuria 03/16/2018  . Orthostatic hypotension 01/26/2017  . Orthostasis 01/25/2017  . Encounter for chronic pain management 01/07/2017  . Memory deficit 07/08/2016  . Chronic constipation 12/19/2015  . Medicare annual wellness visit, initial 06/03/2015  . Advanced care planning/counseling discussion 06/03/2015  . Bilateral low back pain without sciatica 08/01/2014  . Parkinson disease ([place]) 02/01/2014  . 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 BPH (benign prostatic hyperplasia) 11/03/2012  . HTN (hypertension) 09/09/2011  . Health maintenance examination 06/18/2011  . ED (erectile dysfunction) 06/18/2011    Orientation RESPIRATION BLADDER Height & Weight     Self, Place  Normal Incontinent Weight: 80.6 kg Height:  5\' 10"  (177.8 cm)  BEHAVIORAL SYMPTOMS/MOOD NEUROLOGICAL BOWEL NUTRITION STATUS      Continent    AMBULATORY STATUS COMMUNICATION OF NEEDS Skin   Extensive Assist Verbally Skin abrasions                      Personal Care Assistance Level of Assistance  Bathing, Feeding, Dressing, Total care Bathing Assistance: Maximum assistance Feeding assistance: Limited assistance Dressing Assistance: Maximum assistance Total Care Assistance: Maximum assistance   Functional Limitations Info  Sight, Hearing, Speech Sight Info: Impaired(has glasses) Hearing Info: Impaired(uses hearing aids) Speech Info: Adequate    SPECIAL CARE FACTORS FREQUENCY  PT (By licensed PT), OT (By licensed OT)     PT Frequency: 5x per week OT Frequency: 5x per week            Contractures Contractures Info: Not present    Additional Factors Info  Code Status, Allergies Code Status Info: Full code Allergies Info: penicllins           Current Medications (05/20/2019):  This is the current hospital active medication list Current Facility-Administered Medications  Medication Dose Route Frequency Provider Last Rate Last Dose  . acetaminophen (TYLENOL) tablet 650 mg  650 mg Oral Q6H PRN Lance Coon, MD       Or  . acetaminophen (TYLENOL) suppository 650 mg  650 mg Rectal Q6H PRN Lance Coon, MD      . amLODipine (NORVASC) tablet 2.5 mg  2.5 mg Oral Daily Gouru, Aruna, MD   2.5 mg at 05/20/19 1030  . aspirin EC tablet 81 mg  81 mg Oral Jeannie Done, MD   81 mg at 05/19/19 1104  . carbidopa-levodopa (SINEMET IR) 25-100 MG per tablet immediate release 1 tablet  1 tablet Oral Daily Gouru, Aruna, MD   1 tablet at 05/20/19 1030  . enoxaparin (LOVENOX) injection 40 mg  40 mg Subcutaneous QHS Lance Coon, MD   40 mg at 05/19/19 2101  . metoprolol tartrate (LOPRESSOR) injection 5 mg  5 mg Intravenous Q4H PRN Gouru, Aruna, MD   5 mg at 05/19/19 1735  . metoprolol tartrate (LOPRESSOR) tablet 25 mg  25 mg Oral BID Gouru, Aruna, MD   25 mg at 05/20/19 1030  . ondansetron (ZOFRAN) tablet 4 mg  4 mg Oral Q6H PRN Lance Coon, MD       Or  . ondansetron [facility]) injection 4 mg  4 mg Intravenous Q6H PRN  Lance Coon, MD         Discharge Medications: Please see discharge summary for a list of discharge medications.  Relevant Imaging Results:  Relevant Lab Results:   Additional Information ss [id number]  Latanya Maudlin, RN

## 2019-05-20 NOTE — Progress Notes (Addendum)
PROGRESS NOTE    Brandon Manning  [id number] DOB: [birth year] DOA: 05/17/2019 PCP: [name], MD   Brief Narrative:  Per admitting physician: Everth Lapine  is a 73 y.o. male who presents with chief complaint as above.  Patient presents the ED with an episode of altered mental status.  However, this is likely due to significant fluctuations in his blood pressure.  Of late he has been having autonomic dysfunction, with orthostatic hypotension, but then resting hypertension.  He is on both midodrine and antihypertensives to try to control this, but continues to have wild swings in his blood pressure.  Hospitalist were called for admission for further evaluation  He was seen by physical therapy with recommendations for skilled nursing facility for intensive physical therapy   Assessment & Plan:   Principal Problem:   Autonomic dysfunction Active Problems:   HTN (hypertension)   BPH (benign prostatic hyperplasia)   Parkinson disease ([place])   Autonomic dysfunction with fluctuating blood pressures-blood pressure is elevated at this time Metoprolol dose increased to 25mg  p.o. twice daily On small dose amlodipine 2.5 mg Holding midodrine for now No acute findings with echo-preserved ejection fraction Discussed with cardiology-family expressed concerns of the been having difficulty managing blood pressure at home Neurology is following-appreciate their input  AKI-gentle hydration with IV fluids Clinically better.   Potassium 3.9,  CO2 24 Creatinine at 1.08 10/31 We will recheck labs in the morning  Parkinson's disease continue Sinemet.   Neurology- Dr. Doy Mince has seen the patient.   No need to adjust the Parkinson medications at this time PT saw the patient in consultation with recommendations for skilled nursing facility for intensive therapy posthospitalization for transfers, gait, mobility, strength, and balance therapy Teds were placed to help with orthostasis so  patient may tolerate standing and therapy better  Chronic constipation  continue patient's home medications  DVT prophylaxis: Lovenox SQ  Code Status: Full    Code Status Orders  (From admission, onward)         Start     Ordered   05/17/19 2350  Full code  Continuous     05/17/19 2349        Code Status History    Date Active Date Inactive Code Status Order ID Comments User Context   05/13/2019 2308 05/16/2019 1711 Full Code [id number]  Lang Snow, NP ED   04/19/2017 1019 04/21/2017 1819 DNR [id number]  Loletha Grayer, MD ED   01/25/2017 1755 01/28/2017 1448 Full Code [id number]  Hillary Bow, MD ED   Advance Care Planning Activity    Advance Directive Documentation     Most Recent Value  Type of Advance Directive  Healthcare Power of [place], Living will  Pre-existing out of facility DNR order (yellow form or pink MOST form)  --  "MOST" Form in Place?  --     Family Communication: Tried calling wife no answer Disposition Plan:   Patient remained in the hospital, currently unsafe to discharge, requires continued physical therapy for gait mobility balance strength and transfer training. Consults called: None Admission status: Inpatient   Consultants:   Cardiology  Procedures:  Ct Head Wo Contrast  Result Date: 05/13/2019 CLINICAL DATA:  Syncope, weakness, dizziness EXAM: CT HEAD WITHOUT CONTRAST TECHNIQUE: Contiguous axial images were obtained from the base of the skull through the vertex without intravenous contrast. COMPARISON:  01/25/2017, 08/05/2009 FINDINGS: Brain: No evidence of acute infarction, hemorrhage, extra-axial collection, ventriculomegaly, or mass effect. Generalized cerebral atrophy. Periventricular white matter low  attenuation likely secondary to microangiopathy. Vascular: Cerebrovascular atherosclerotic calcifications are noted. Skull: Negative for fracture or focal lesion. Sinuses/Orbits: Visualized portions of the orbits are  unremarkable. Visualized portions of the paranasal sinuses and mastoid air cells are unremarkable. Other: Partially visualized sebaceous cyst in the posterior scalp. IMPRESSION: No acute intracranial pathology. Electronically Signed   By: Kathreen Devoid   On: 05/13/2019 17:43   Dg Chest Portable 1 View  Result Date: 05/13/2019 CLINICAL DATA:  Syncope, weakness, dizziness EXAM: PORTABLE CHEST 1 VIEW COMPARISON:  01/25/2017 FINDINGS: The heart size and mediastinal contours are within normal limits. Both lungs are clear. The visualized skeletal structures are unremarkable. IMPRESSION: No active disease. Electronically Signed   By: Kathreen Devoid   On: 05/13/2019 17:40     Antimicrobials:   None   Subjective: No acute events overnight. Patient still weak   Objective: Vitals:   05/19/19 1806 05/19/19 2104 05/20/19 0509 05/20/19 0733  BP: (!) 153/98 (!) 161/79 138/81 (!) 160/81  Pulse: 78 70 74 76  Resp:  18 20 17   Temp:  98.9 F (37.2 C) 98.7 F (37.1 C) 97.9 F (36.6 C)  TempSrc:  Oral Oral Oral  SpO2:  100% 100% 100%  Weight:   80.6 kg   Height:        Intake/Output Summary (Last 24 hours) at 05/20/2019 1405 Last data filed at 05/20/2019 0509 Gross per 24 hour  Intake --  Output 900 ml  Net -900 ml   Filed Weights   05/17/19 1621 05/19/19 0536 05/20/19 0509  Weight: 82.8 kg 81.4 kg 80.6 kg    Examination:  General exam: Appears calm and comfortable  Respiratory system: Clear to auscultation. Respiratory effort normal. Cardiovascular system: S1 & S2 heard, RRR. No JVD, murmurs, rubs, gallops or clicks. No pedal edema. Gastrointestinal system: Abdomen is nondistended, soft and nontender. No organomegaly or masses felt. Normal bowel sounds heard. Central nervous system: Alert and oriented. No focal neurological deficits. Extremities: Globally weak no obvious contractures, warm well perfused Skin: No rashes, lesions or ulcers Psychiatry: Judgement and insight appear  moderately impaired poor insight. Mood & affect flat    Data Reviewed: I have personally reviewed following labs and imaging studies  CBC: Recent Labs  Lab 05/13/19 1642 05/15/19 0543 05/17/19 1642 05/18/19 0551 05/19/19 0625  WBC 10.1 9.3 9.9 11.8* 9.0  NEUTROABS  --   --  7.0  --   --   HGB 12.2* 11.3* 11.8* 11.9* 12.1*  HCT 39.5 35.7* 37.1* 36.2* 37.2*  MCV 88.2 86.9 85.5 82.6 82.3  PLT 261 234 271 275 [id number]   Basic Metabolic Panel: Recent Labs  Lab 05/14/19 0400 05/15/19 0543 05/16/19 0428 05/17/19 1642 05/18/19 0551 05/19/19 0625  NA 143 142 138 138 141 141  K 3.7 3.9 3.7 3.9 4.1 3.9  CL 109 111 106 104 109 107  CO2 26 28 24 25  21* 24  GLUCOSE 126* 140* 133* 141* 122* 138*  BUN 24* 21 15 20 21 23   CREATININE 1.28* 1.21 1.13 1.54* 1.34* 1.08  CALCIUM 8.8* 8.8* 8.8* 9.1 8.8* 9.1  MG 1.9  --   --   --   --   --    GFR: Estimated Creatinine Clearance: 61 mL/min (by C-G formula based on SCr of 1.08 mg/dL). Liver Function Tests: Recent Labs  Lab 05/13/19 1930 05/14/19 0400 05/17/19 1642  AST 16 14* 18  ALT 5 10 5   ALKPHOS 32* 49 54  BILITOT 0.8 1.3* 0.8  PROT 5.3* 7.0 7.1  ALBUMIN 2.6* 3.3* 3.3*   No results for input(s): LIPASE, AMYLASE in the last 168 hours. No results for input(s): AMMONIA in the last 168 hours. Coagulation Profile: No results for input(s): INR, PROTIME in the last 168 hours. Cardiac Enzymes: No results for input(s): CKTOTAL, CKMB, CKMBINDEX, TROPONINI in the last 168 hours. BNP (last 3 results) No results for input(s): PROBNP in the last 8760 hours. HbA1C: No results for input(s): HGBA1C in the last 72 hours. CBG: Recent Labs  Lab 05/14/19 0905 05/15/19 0444 05/15/19 0500 05/16/19 0535 05/16/19 0817  GLUCAP 143* 130* 133* 129* 148*   Lipid Profile: No results for input(s): CHOL, HDL, LDLCALC, TRIG, CHOLHDL, LDLDIRECT in the last 72 hours. Thyroid Function Tests: No results for input(s): TSH, T4TOTAL, FREET4, T3FREE,  THYROIDAB in the last 72 hours. Anemia Panel: No results for input(s): VITAMINB12, FOLATE, FERRITIN, TIBC, IRON, RETICCTPCT in the last 72 hours. Sepsis Labs: Recent Labs  Lab 05/17/19 1638  LATICACIDVEN 1.7    Recent Results (from the past 240 hour(s))  SARS CORONAVIRUS 2 (TAT 6-24 HRS) Nasopharyngeal Nasopharyngeal Swab     Status: None   Collection Time: 05/13/19 11:55 PM   Specimen: Nasopharyngeal Swab  Result Value Ref Range Status   SARS Coronavirus 2 NEGATIVE NEGATIVE Final    Comment: (NOTE) SARS-CoV-2 target nucleic acids are NOT DETECTED. The SARS-CoV-2 RNA is generally detectable in upper and lower respiratory specimens during the acute phase of infection. Negative results do not preclude SARS-CoV-2 infection, do not rule out co-infections with other pathogens, and should not be used as the sole basis for treatment or other patient management decisions. Negative results must be combined with clinical observations, patient history, and epidemiological information. The expected result is Negative. Fact Sheet for Patients: SugarRoll.be Fact Sheet for Healthcare Providers: https://www.woods-mathews.com/ This test is not yet approved or cleared by the [place] FDA and  has been authorized for detection and/or diagnosis of SARS-CoV-2 by FDA under an Emergency Use Authorization (EUA). This EUA will remain  in effect (meaning this test can be used) for the duration of the COVID-19 declaration under Section 56 4(b)(1) of the Act, 21 U.S.C. section 360bbb-3(b)(1), unless the authorization is terminated or revoked sooner. Performed at [facility], [place] [address], [place], [place] [address]   Urine culture     Status: Abnormal   Collection Time: 05/17/19  4:42 PM   Specimen: Urine, Random  Result Value Ref Range Status   Specimen Description   Final    URINE, RANDOM Performed at [facility], [address], [place], [place] [address]    Special Requests   Final    NONE Performed at [facility], [address] [place], [place] [address]    Culture MULTIPLE SPECIES PRESENT, SUGGEST RECOLLECTION (A)  Final   Report Status 05/18/2019 FINAL  Final  Culture, blood (routine x 2)     Status: None (Preliminary result)   Collection Time: 05/17/19  4:42 PM   Specimen: BLOOD  Result Value Ref Range Status   Specimen Description BLOOD RIGHT ANTECUBITAL  Final   Special Requests   Final    BOTTLES DRAWN AEROBIC AND ANAEROBIC Blood Culture adequate volume   Culture   Final    NO GROWTH 3 DAYS Performed at [facility], [address] [place],  [address]    Report Status PENDING  Incomplete  Culture, blood (routine x 2)     Status:  None (Preliminary result)   Collection Time: 05/17/19  4:42 PM   Specimen: BLOOD  Result Value Ref Range Status   Specimen Description BLOOD BLOOD LEFT ARM  Final   Special Requests   Final    BOTTLES DRAWN AEROBIC AND ANAEROBIC Blood Culture results may not be optimal due to an excessive volume of blood received in culture bottles   Culture   Final    NO GROWTH 3 DAYS Performed at [facility], [address] [place], [place] [address]    Report Status PENDING  Incomplete  SARS CORONAVIRUS 2 (TAT 6-24 HRS) Nasopharyngeal Nasopharyngeal Swab     Status: None   Collection Time: 05/17/19  7:54 PM   Specimen: Nasopharyngeal Swab  Result Value Ref Range Status   SARS Coronavirus 2 NEGATIVE NEGATIVE Final    Comment: (NOTE) SARS-CoV-2 target nucleic acids are NOT DETECTED. The SARS-CoV-2 RNA is generally detectable in upper and lower respiratory specimens during the acute phase of infection. Negative results do not preclude SARS-CoV-2 infection, do not rule out co-infections with other pathogens, and should not be used as the sole basis for treatment or other patient management decisions. Negative results must be combined with  clinical observations, patient history, and epidemiological information. The expected result is Negative. Fact Sheet for Patients: SugarRoll.be Fact Sheet for Healthcare Providers: https://www.woods-mathews.com/ This test is not yet approved or cleared by the [place] FDA and  has been authorized for detection and/or diagnosis of SARS-CoV-2 by FDA under an Emergency Use Authorization (EUA). This EUA will remain  in effect (meaning this test can be used) for the duration of the COVID-19 declaration under Section 56 4(b)(1) of the Act, 21 U.S.C. section 360bbb-3(b)(1), unless the authorization is terminated or revoked sooner. Performed at [facility], [place] [address], [place],  [address]          Radiology Studies: No results found.      Scheduled Meds:  amLODipine  2.5 mg Oral Daily   aspirin EC  81 mg Oral QODAY   carbidopa-levodopa  1 tablet Oral Daily   enoxaparin (LOVENOX) injection  40 mg Subcutaneous QHS   metoprolol tartrate  25 mg Oral BID   Continuous Infusions:   LOS: 3 days    Time spent: 35 min    Nicolette Bang, MD [facility]  If 7PM-7AM, please contact night-coverage  05/20/2019, 2:05 PM

## 2019-05-20 NOTE — TOC Initial Note (Signed)
Transition of Care [facility]) - Initial/Assessment Note    Patient Details  Name: Brandon Manning MRN: [id number] Date of Birth: [birth year]  Transition of [facility]) CM/SW Contact:    Latanya Maudlin, RN Phone Number: 05/20/2019, 11:09 AM  Clinical Narrative:  [facility] team consulted to assist with disposition. Patient is currently confused. Per spouse she would like to patient to go to [facility]. PT has recommended SNF. Patient recently refused HHPT on last admission due to [place] concerns. Patient was sent home with [facility]. Also, has rolling walker, wheelchair and cane. FL2 done. Bed offers sent out. [facility] opens back on Monday, we will await offers.                Expected Discharge Plan: Skilled Nursing Facility Barriers to Discharge: Continued Medical Work up   Patient Goals and CMS Choice   CMS Medicare.gov Compare Post Acute Care list provided to:: Patient Choice offered to / list presented to : Patient, Spouse  Expected Discharge Plan and Services Expected Discharge Plan: [place] Acute Care Choice: [place] Living arrangements for the past 2 months: Single Family Home                                      Prior Living Arrangements/Services Living arrangements for the past 2 months: Single Family Home Lives with:: Spouse              Current home services: DME    Activities of Daily Living Home Assistive Devices/Equipment: Dentures (specify type), Walker (specify type) ADL Screening (condition at time of admission) Patient's cognitive ability adequate to safely complete daily activities?: No Is the patient deaf or have difficulty hearing?: Yes Does the patient have difficulty seeing, even when wearing glasses/contacts?: Yes Does the patient have difficulty concentrating, remembering, or making decisions?: Yes Patient able to express need for assistance with ADLs?: No Does the patient have difficulty dressing or  bathing?: Yes Independently performs ADLs?: No Communication: Independent Dressing (OT): Needs assistance Is this a change from baseline?: Pre-admission baseline Grooming: Needs assistance Is this a change from baseline?: Pre-admission baseline Feeding: Needs assistance Is this a change from baseline?: Pre-admission baseline Bathing: Needs assistance Is this a change from baseline?: Pre-admission baseline Toileting: Needs assistance Is this a change from baseline?: Pre-admission baseline In/Out Bed: Needs assistance Is this a change from baseline?: Pre-admission baseline Walks in Home: Needs assistance Is this a change from baseline?: Pre-admission baseline Does the patient have difficulty walking or climbing stairs?: Yes Weakness of Legs: Both Weakness of Arms/Hands: Both  Permission Sought/Granted                  Emotional Assessment              Admission diagnosis:  Malignant hypertension [I10] Generalized weakness [R53.1] Parkinson's disease ([place]) [G20] Patient Active Problem List   Diagnosis Date Noted  . Autonomic dysfunction 05/17/2019  . Syncope 05/13/2019  . Pedal edema 04/16/2019  . Vitamin D deficiency 02/02/2019  . Vitamin B12 deficiency 05/10/2018  . Urge incontinence 03/16/2018  . Hematuria 03/16/2018  . Orthostatic hypotension 01/26/2017  . Orthostasis 01/25/2017  . Encounter for chronic pain management 01/07/2017  . Memory deficit 07/08/2016  . Chronic constipation 12/19/2015  . Medicare annual wellness visit, initial 06/03/2015  . Advanced care planning/counseling discussion 06/03/2015  . Bilateral low  back pain without sciatica 08/01/2014  . Parkinson disease ([place]) 02/01/2014  . 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 BPH (benign prostatic hyperplasia) 11/03/2012  . HTN (hypertension) 09/09/2011  . Health maintenance examination 06/18/2011  . ED (erectile dysfunction) 06/18/2011   PCP:  [name], MD Pharmacy:   [facility] [address]), [place] [address] [place]) [place] [address] Phone: [phone] Fax: [facility] [place], [place] [address] Phone: [phone] Fax: [phone]     Social Determinants of Health (SDOH) Interventions    Readmission Risk Interventions No flowsheet data found.

## 2019-05-21 NOTE — Progress Notes (Signed)
PROGRESS NOTE    Brandon Manning  [id number] DOB: 12/12/1945 DOA: 05/17/2019 PCP: [name], MD   Brief Narrative:  Per admitting physician: [name] presents with chief complaint as above. Patient presents the ED with an episode of altered mental status. However, this is likely due to significant fluctuations in his blood pressure. Of late he has been having autonomic dysfunction, with orthostatic hypotension, but then resting hypertension. He is on both midodrine and antihypertensives to try to control this, but continues to have wild swings in his blood pressure. Hospitalist were called for admission for further evaluation  He was seen by physical therapy with recommendations for skilled nursing facility for intensive physical therapy   Assessment & Plan:   Principal Problem:   Autonomic dysfunction Active Problems:   HTN (hypertension)   BPH (benign prostatic hyperplasia)   Parkinson disease ([place])   Chronic constipation   Autonomic dysfunction with fluctuating blood pressures-blood pressure is elevated at this time Appears to be stabilizing CONT Metoprolol 25mg  p.o. twice daily On LOW dose amlodipine 2.5 mg Holding midodrine for now No acute findings with echo-preserved ejection fraction Discussed with cardiology-family expressed concerns of the been having difficulty managing blood pressure at home Will need to continue to monitor for additional day Neurologyis following-appreciate their input  AKI-gentle hydration with IV fluids Clinically better.  Potassium 3.9,  CO2 24 Creatinine at 1.08 10/31 We will recheck labs in the morning  Parkinson's disease continue Sinemet.  Neurology-Dr. Berdie Ogren seen the patient.  No need to adjust the Parkinson medications at this time PT saw the patient in consultation with recommendations for skilled nursing facility for intensive therapy posthospitalization for transfers,  gait, mobility, strength, and balance therapy Teds were placed to help with orthostasis so patient may tolerate standing and therapy better  Chronic constipation  continue patient's home medications  DVT prophylaxis: Lovenox SQ  Code Status: Full    Code Status Orders  (From admission, onward)         Start     Ordered   05/17/19 2350  Full code  Continuous     05/17/19 2349        Code Status History    Date Active Date Inactive Code Status Order ID Comments User Context   05/13/2019 2308 05/16/2019 1711 Full Code [id number]  Lang Snow, NP ED   04/19/2017 1019 04/21/2017 1819 DNR [id number]  Loletha Grayer, MD ED   01/25/2017 1755 01/28/2017 1448 Full Code [id number]  Hillary Bow, MD ED   Advance Care Planning Activity    Advance Directive Documentation     Most Recent Value  Type of Advance Directive  Healthcare Power of Attorney, Living will  Pre-existing out of facility DNR order (yellow form or pink MOST form)  -  "MOST" Form in Place?  -     Family Communication: None today Disposition Plan:   Patient remained in the hospital, currently unsafe to discharge, requires continued physical therapy for gait mobility balance strength and transfer training. Consults called: None Admission status: Inpatient   Consultants:   Cardiology  Procedures:  Ct Head Wo Contrast  Result Date: 05/13/2019 CLINICAL DATA:  Syncope, weakness, dizziness EXAM: CT HEAD WITHOUT CONTRAST TECHNIQUE: Contiguous axial images were obtained from the base of the skull through the vertex without intravenous contrast. COMPARISON:  01/25/2017, 08/05/2009 FINDINGS: Brain: No evidence of acute infarction, hemorrhage, extra-axial collection, ventriculomegaly, or mass effect. Generalized cerebral atrophy. Periventricular white matter low attenuation likely secondary to microangiopathy.  Vascular: Cerebrovascular atherosclerotic calcifications are noted. Skull: Negative for fracture or focal  lesion. Sinuses/Orbits: Visualized portions of the orbits are unremarkable. Visualized portions of the paranasal sinuses and mastoid air cells are unremarkable. Other: Partially visualized sebaceous cyst in the posterior scalp. IMPRESSION: No acute intracranial pathology. Electronically Signed   By: Kathreen Devoid   On: 05/13/2019 17:43   Dg Chest Portable 1 View  Result Date: 05/13/2019 CLINICAL DATA:  Syncope, weakness, dizziness EXAM: PORTABLE CHEST 1 VIEW COMPARISON:  01/25/2017 FINDINGS: The heart size and mediastinal contours are within normal limits. Both lungs are clear. The visualized skeletal structures are unremarkable. IMPRESSION: No active disease. Electronically Signed   By: Kathreen Devoid   On: 05/13/2019 17:40     Antimicrobials:   None   Subjective: No acute events overnight. Patient still weak  Objective: Vitals:   05/20/19 0733 05/20/19 2055 05/21/19 0353 05/21/19 0749  BP: (!) 160/81 (!) 147/74 (!) 151/86 (!) 187/95  Pulse: 76 79 75 71  Resp: 17   19  Temp: 97.9 F (36.6 C) 98.7 F (37.1 C) 98 F (36.7 C) 99 F (37.2 C)  TempSrc: Oral Oral Oral Oral  SpO2: 100% 100% 100% 100%  Weight:      Height:        Intake/Output Summary (Last 24 hours) at 05/21/2019 1449 Last data filed at 05/21/2019 1300 Gross per 24 hour  Intake 720 ml  Output 800 ml  Net -80 ml   Filed Weights   05/17/19 1621 05/19/19 0536 05/20/19 0509  Weight: 82.8 kg 81.4 kg 80.6 kg    Examination:  General exam: Appears calm and comfortable  Respiratory system: Clear to auscultation. Respiratory effort normal. Cardiovascular system: S1 & S2 heard, RRR. No JVD, murmurs, rubs, gallops or clicks. No pedal edema. Gastrointestinal system: Abdomen is nondistended, soft and nontender. No organomegaly or masses felt. Normal bowel sounds heard. Central nervous system: Alert and oriented. No focal neurological deficits. Extremities: Globally weak no obvious contractures, warm well perfused Skin:  No rashes, lesions or ulcers Psychiatry: Judgement and insight appear moderately impaired poor insight. Mood & affect flat   Data Reviewed: I have personally reviewed following labs and imaging studies  CBC: Recent Labs  Lab 05/15/19 0543 05/17/19 1642 05/18/19 0551 05/19/19 0625  WBC 9.3 9.9 11.8* 9.0  NEUTROABS  --  7.0  --   --   HGB 11.3* 11.8* 11.9* 12.1*  HCT 35.7* 37.1* 36.2* 37.2*  MCV 86.9 85.5 82.6 82.3  PLT 234 271 275 [id number]   Basic Metabolic Panel: Recent Labs  Lab 05/15/19 0543 05/16/19 0428 05/17/19 1642 05/18/19 0551 05/19/19 0625  NA 142 138 138 141 141  K 3.9 3.7 3.9 4.1 3.9  CL 111 106 104 109 107  CO2 28 24 25  21* 24  GLUCOSE 140* 133* 141* 122* 138*  BUN 21 15 20 21 23   CREATININE 1.21 1.13 1.54* 1.34* 1.08  CALCIUM 8.8* 8.8* 9.1 8.8* 9.1   GFR: Estimated Creatinine Clearance: 61 mL/min (by C-G formula based on SCr of 1.08 mg/dL). Liver Function Tests: Recent Labs  Lab 05/17/19 1642  AST 18  ALT 5  ALKPHOS 54  BILITOT 0.8  PROT 7.1  ALBUMIN 3.3*   No results for input(s): LIPASE, AMYLASE in the last 168 hours. No results for input(s): AMMONIA in the last 168 hours. Coagulation Profile: No results for input(s): INR, PROTIME in the last 168 hours. Cardiac Enzymes: No results for input(s): CKTOTAL, CKMB, CKMBINDEX, TROPONINI in  the last 168 hours. BNP (last 3 results) No results for input(s): PROBNP in the last 8760 hours. HbA1C: No results for input(s): HGBA1C in the last 72 hours. CBG: Recent Labs  Lab 05/15/19 0444 05/15/19 0500 05/16/19 0535 05/16/19 0817  GLUCAP 130* 133* 129* 148*   Lipid Profile: No results for input(s): CHOL, HDL, LDLCALC, TRIG, CHOLHDL, LDLDIRECT in the last 72 hours. Thyroid Function Tests: No results for input(s): TSH, T4TOTAL, FREET4, T3FREE, THYROIDAB in the last 72 hours. Anemia Panel: No results for input(s): VITAMINB12, FOLATE, FERRITIN, TIBC, IRON, RETICCTPCT in the last 72 hours. Sepsis Labs:  Recent Labs  Lab 05/17/19 1638  LATICACIDVEN 1.7    Recent Results (from the past 240 hour(s))  SARS CORONAVIRUS 2 (TAT 6-24 HRS) Nasopharyngeal Nasopharyngeal Swab     Status: None   Collection Time: 05/13/19 11:55 PM   Specimen: Nasopharyngeal Swab  Result Value Ref Range Status   SARS Coronavirus 2 NEGATIVE NEGATIVE Final    Comment: (NOTE) SARS-CoV-2 target nucleic acids are NOT DETECTED. The SARS-CoV-2 RNA is generally detectable in upper and lower respiratory specimens during the acute phase of infection. Negative results do not preclude SARS-CoV-2 infection, do not rule out co-infections with other pathogens, and should not be used as the sole basis for treatment or other patient management decisions. Negative results must be combined with clinical observations, patient history, and epidemiological information. The expected result is Negative. Fact Sheet for Patients: SugarRoll.be Fact Sheet for Healthcare Providers: https://www.woods-mathews.com/ This test is not yet approved or cleared by the [place] FDA and  has been authorized for detection and/or diagnosis of SARS-CoV-2 by FDA under an Emergency Use Authorization (EUA). This EUA will remain  in effect (meaning this test can be used) for the duration of the COVID-19 declaration under Section 56 4(b)(1) of the Act, 21 U.S.C. section 360bbb-3(b)(1), unless the authorization is terminated or revoked sooner. Performed at [facility], [place] [address], [place], [place] [address]   Urine culture     Status: Abnormal   Collection Time: 05/17/19  4:42 PM   Specimen: Urine, Random  Result Value Ref Range Status   Specimen Description   Final    URINE, RANDOM Performed at [facility], [address] [place], [place] [address]    Special Requests   Final    NONE Performed at [facility], [address] [place], [place] [address]    Culture  MULTIPLE SPECIES PRESENT, SUGGEST RECOLLECTION (A)  Final   Report Status 05/18/2019 FINAL  Final  Culture, blood (routine x 2)     Status: None (Preliminary result)   Collection Time: 05/17/19  4:42 PM   Specimen: BLOOD  Result Value Ref Range Status   Specimen Description BLOOD RIGHT ANTECUBITAL  Final   Special Requests   Final    BOTTLES DRAWN AEROBIC AND ANAEROBIC Blood Culture adequate volume   Culture   Final    NO GROWTH 4 DAYS Performed at [facility], [address], [place], [place] [address]    Report Status PENDING  Incomplete  Culture, blood (routine x 2)     Status: None (Preliminary result)   Collection Time: 05/17/19  4:42 PM   Specimen: BLOOD  Result Value Ref Range Status   Specimen Description BLOOD BLOOD LEFT ARM  Final   Special Requests   Final    BOTTLES DRAWN AEROBIC AND ANAEROBIC Blood Culture results may not be optimal due to an excessive volume of  blood received in culture bottles   Culture   Final    NO GROWTH 4 DAYS Performed at [facility], [address] [place], [place] [address]    Report Status PENDING  Incomplete  SARS CORONAVIRUS 2 (TAT 6-24 HRS) Nasopharyngeal Nasopharyngeal Swab     Status: None   Collection Time: 05/17/19  7:54 PM   Specimen: Nasopharyngeal Swab  Result Value Ref Range Status   SARS Coronavirus 2 NEGATIVE NEGATIVE Final    Comment: (NOTE) SARS-CoV-2 target nucleic acids are NOT DETECTED. The SARS-CoV-2 RNA is generally detectable in upper and lower respiratory specimens during the acute phase of infection. Negative results do not preclude SARS-CoV-2 infection, do not rule out co-infections with other pathogens, and should not be used as the sole basis for treatment or other patient management decisions. Negative results must be combined with clinical observations, patient history, and epidemiological information. The expected result is Negative. Fact Sheet for Patients:  SugarRoll.be Fact Sheet for Healthcare Providers: https://www.woods-mathews.com/ This test is not yet approved or cleared by the [place] FDA and  has been authorized for detection and/or diagnosis of SARS-CoV-2 by FDA under an Emergency Use Authorization (EUA). This EUA will remain  in effect (meaning this test can be used) for the duration of the COVID-19 declaration under Section 56 4(b)(1) of the Act, 21 U.S.C. section 360bbb-3(b)(1), unless the authorization is terminated or revoked sooner. Performed at [facility], [place] [address], [place], [place] [address]          Radiology Studies: No results found.      Scheduled Meds: . amLODipine  2.5 mg Oral Daily  . aspirin EC  81 mg Oral QODAY  . carbidopa-levodopa  1 tablet Oral Daily  . enoxaparin (LOVENOX) injection  40 mg Subcutaneous QHS  . metoprolol tartrate  25 mg Oral BID   Continuous Infusions:   LOS: 4 days    Time spent: 35 min    Nicolette Bang, MD [facility]  If 7PM-7AM, please contact night-coverage  05/21/2019, 2:49 PM

## 2019-05-21 NOTE — TOC Progression Note (Signed)
Transition of Care [facility]) - Progression Note    Patient Details  Name: Brandon Manning MRN: [id number] Date of Birth: May 24, 1946  [facility]) CM/SW Contact  [name], [place] Phone Number: 05/21/2019, 6:03 PM  Clinical Narrative:    CSW spoke with patient and his wife to explain how insurance will pay for stay, and what to expect at SNF.  Patient's wife would like patient to go to [facility] if possible.  CSW contacted [facility], and spoke to [place] in admissions.  Patient was accepted, by SNF, CSW contacted Patient's wife, she is agreeable to [facility].  CSW contacted [place] her to start insurance authorization.  CSW also requested that PT see patient tomorrow for an updated PT note.  CSW waiting for insurance authorization.   Expected Discharge Plan: [place] Barriers to Discharge: Continued Medical Work up  Expected Discharge Plan and Services Expected Discharge Plan: [place] Choice: [place] arrangements for the past 2 months: Single Family Home                                       Social Determinants of Health (SDOH) Interventions    Readmission Risk Interventions No flowsheet data found.

## 2019-05-21 NOTE — Care Management Important Message (Signed)
Important Message  Patient Details  Name: Brandon Manning MRN: [id number] Date of Birth: [birth year]   Medicare Important Message Given:  Yes     Dannette Barbara 05/21/2019, 11:40 AM

## 2019-05-22 LAB — BASIC METABOLIC PANEL
Anion gap: 6 (ref 5–15)
BUN: 28 mg/dL — ABNORMAL HIGH (ref 8–23)
CO2: 25 mmol/L (ref 22–32)
Calcium: 8.5 mg/dL — ABNORMAL LOW (ref 8.9–10.3)
Chloride: 107 mmol/L (ref 98–111)
Creatinine, Ser: 1.17 mg/dL (ref 0.61–1.24)
GFR calc Af Amer: >60 mL/min (ref >60)
GFR calc non Af Amer: >60 mL/min (ref >60)
Glucose, Bld: 118 mg/dL — ABNORMAL HIGH (ref 70–99)
Potassium: 3.5 mmol/L (ref 3.5–5.1)
Sodium: 138 mmol/L (ref 135–145)

## 2019-05-22 LAB — CULTURE, BLOOD (ROUTINE X 2)
Culture: NO GROWTH
Culture: NO GROWTH
Special Requests: ADEQUATE

## 2019-05-22 LAB — CBC WITH DIFFERENTIAL/PLATELET
Abs Immature Granulocytes: 0.04 10*3/uL (ref 0.00–0.07)
Basophils Absolute: 0 10*3/uL (ref 0.0–0.1)
Basophils Relative: 1 %
Eosinophils Absolute: 0.2 10*3/uL (ref 0.0–0.5)
Eosinophils Relative: 3 %
HCT: 32.1 % — ABNORMAL LOW (ref 39.0–52.0)
Hemoglobin: 10.5 g/dL — ABNORMAL LOW (ref 13.0–17.0)
Immature Granulocytes: 1 %
Lymphocytes Relative: 33 %
Lymphs Abs: 2.9 10*3/uL (ref 0.7–4.0)
MCH: 27.3 pg (ref 26.0–34.0)
MCHC: 32.7 g/dL (ref 30.0–36.0)
MCV: 83.6 fL (ref 80.0–100.0)
Monocytes Absolute: 0.9 10*3/uL (ref 0.1–1.0)
Monocytes Relative: 11 %
Neutro Abs: 4.6 10*3/uL (ref 1.7–7.7)
Neutrophils Relative %: 51 %
Platelets: 275 10*3/uL (ref 150–400)
RBC: 3.84 MIL/uL — ABNORMAL LOW (ref 4.22–5.81)
RDW: 14.3 % (ref 11.5–15.5)
WBC: 8.7 10*3/uL (ref 4.0–10.5)
nRBC: 0 % (ref 0.0–0.2)

## 2019-05-22 MED ORDER — POLYETHYLENE GLYCOL 3350 17 G PO PACK
17.0000 g | PACK | Freq: Every day | ORAL | Status: DC
  Administered 2019-05-22 – 2019-05-24 (×3): 17 g via ORAL
  Filled 2019-05-22 (×3): qty 1

## 2019-05-22 NOTE — Progress Notes (Signed)
Physical Therapy Treatment Patient Details Name: Brandon Manning MRN: [id number] DOB: 1945-12-22 Today's Date: 05/22/2019    History of Present Illness Pt is a 73 year old male re-admitted 24 hrs after discharging from the hospital with c/o weakness and difficulty transferring.  PMH is PD and Htn.    PT Comments    Participated in exercises as described below.  To edge of bed with mod a x 1.  Once sitting, steady for exercises about 5 minutes.  Transferred to recliner at bedside with mod a x 1.  Pt did not get "stuck" today during transfer and seemed to have an easier time moving his feet to the chair.  Remained up after session.  Pt continues to require +2 assist for transfers and gait remains limited to transfers only.  SNF remains appropriate for discharge.   Follow Up Recommendations  SNF     Equipment Recommendations  None recommended by PT    Recommendations for Other Services       Precautions / Restrictions Precautions Precautions: Fall Precaution Comments: High Fall Restrictions Weight Bearing Restrictions: No    Mobility  Bed Mobility Overal bed mobility: Needs Assistance Bed Mobility: Supine to Sit     Supine to sit: Mod assist     General bed mobility comments: increased time and assist but once upright he is able to scoot hips to edge of bed fairly well  Transfers Overall transfer level: Needs assistance Equipment used: 2 person hand held assist Transfers: Stand Pivot Transfers Sit to Stand: Mod assist;+2 physical assistance Stand pivot transfers: From elevated surface;Mod assist;+2 safety/equipment       General transfer comment: pt seemed to have an easier time moving his feet today and did not become "stuck" during transfer today.  Needed less assist to "guide him to the chair"  Ambulation/Gait                 Stairs             Wheelchair Mobility    Modified Rankin (Stroke Patients Only)       Balance Overall balance  assessment: Needs assistance Sitting-balance support: Feet supported Sitting balance-Leahy Scale: Fair Sitting balance - Comments: steady sitting EOB   Standing balance support: Bilateral upper extremity supported Standing balance-Leahy Scale: Poor Standing balance comment: reliant on UE support from staff, requires +2 assist for transfers.                            Cognition Arousal/Alertness: Awake/alert Behavior During Therapy: WFL for tasks assessed/performed Overall Cognitive Status: History of cognitive impairments - at baseline                                        Exercises Other Exercises Other Exercises: supine - ankle pumps, SLR, ab/add and heel slides x 10 Other Exercises: seated - LAQ, marches, ab/add x 10 unsupported sitting.    General Comments        Pertinent Vitals/Pain Pain Assessment: No/denies pain    Home Living                      Prior Function            PT Goals (current goals can now be found in the care plan section) Progress towards PT goals: Progressing toward goals  Frequency    Min 2X/week      PT Plan Current plan remains appropriate    Co-evaluation              AM-PAC PT "6 Clicks" Mobility   Outcome Measure  Help needed turning from your back to your side while in a flat bed without using bedrails?: A Little Help needed moving from lying on your back to sitting on the side of a flat bed without using bedrails?: A Little Help needed moving to and from a bed to a chair (including a wheelchair)?: A Lot Help needed standing up from a chair using your arms (e.g., wheelchair or bedside chair)?: A Lot Help needed to walk in hospital room?: A Lot Help needed climbing 3-5 steps with a railing? : Total 6 Click Score: 13    End of Session Equipment Utilized During Treatment: Gait belt Activity Tolerance: Patient tolerated treatment well Patient left: in chair;with call bell/phone  within reach;with chair alarm set Nurse Communication: Mobility status       Time: [id number] PT Time Calculation (min) (ACUTE ONLY): 18 min  Charges:  $Therapeutic Exercise: 8-22 mins                    [name], PTA 05/22/19, 9:02 AM

## 2019-05-22 NOTE — TOC Progression Note (Signed)
Transition of Care [facility]) - Progression Note    Patient Details  Name: ASAIAH GUMAN MRN: [id number] Date of Birth: Sep 06, 1945  Transition of Care [facility]) CM/SW Contact  [name], [place] Phone Number: 05/22/2019, 10:11 AM  Clinical Narrative:     CSW sent updated notes to [facility] for insurance authorization through regular [place].   Expected Discharge Plan: [place] Barriers to Discharge: Continued Medical Work up  Expected Discharge Plan and Services Expected Discharge Plan: [place] Choice: [place] arrangements for the past 2 months: Single Family Home                                       Social Determinants of Health (SDOH) Interventions    Readmission Risk Interventions No flowsheet data found.

## 2019-05-22 NOTE — Progress Notes (Signed)
Patient care taken over by this RN. Patient without complaints.  Call bell in reach.

## 2019-05-22 NOTE — Progress Notes (Signed)
PROGRESS NOTE    Brandon Manning  [id number] DOB: 1946/02/17 DOA: 05/17/2019 PCP: [name], MD   Brief Narrative:  Per admitting physician: [name] presents with chief complaint as above. Patient presents the ED with an episode of altered mental status. However, this is likely due to significant fluctuations in his blood pressure. Of late he has been having autonomic dysfunction, with orthostatic hypotension, but then resting hypertension. He is on both midodrine and antihypertensives to try to control this, but continues to have wild swings in his blood pressure. Hospitalist were called for admission for further evaluation  He was seen by physical therapy with recommendations for skilled nursing facility for intensive physical therapy   Assessment & Plan:   Principal Problem:   Autonomic dysfunction Active Problems:   HTN (hypertension)   BPH (benign prostatic hyperplasia)   Parkinson disease ([place])   Chronic constipation   Autonomic dysfunction with fluctuating blood pressures Appears to be stabilizing CONT Metoprolol 25mg  p.o. twice daily On LOW dose amlodipine 2.5 mg Holdingmidodrine for now No acute findings with echo-preserved ejection fraction Discussed with cardiology-family expressed concerns of the been having difficulty managing blood pressure at home Will need to continue to monitor for additional day Neurologyis following-appreciate their input  AKI-gentle hydration with IV fluids Clinically better. Potassium 3.5,CO2 25 Creatinine at 1.1710/31 We will recheck labs in the morning  Parkinson's disease continue Sinemet.  Neurology-Dr. Berdie Ogren seen the patient.  No need to adjust the Parkinson medications at this time PT saw the patient in consultation with recommendations for skilled nursing facility for intensive therapy posthospitalization for transfers, gait, mobility, strength, and balance  therapy Tedswereplaced to help with orthostasis so patient may tolerate standing and therapy better  Chronic constipation  continue patient's home medications  DVT prophylaxis: Lovenox SQ  Code Status: Full    Code Status Orders  (From admission, onward)         Start     Ordered   05/17/19 2350  Full code  Continuous     05/17/19 2349        Code Status History    Date Active Date Inactive Code Status Order ID Comments User Context   05/13/2019 2308 05/16/2019 1711 Full Code [id number]  Lang Snow, NP ED   04/19/2017 1019 04/21/2017 1819 DNR [id number]  Loletha Grayer, MD ED   01/25/2017 1755 01/28/2017 1448 Full Code [id number]  Hillary Bow, MD ED   Advance Care Planning Activity    Advance Directive Documentation     Most Recent Value  Type of Advance Directive  Healthcare Power of Attorney, Living will  Pre-existing out of facility DNR order (yellow form or pink MOST form)  -  "MOST" Form in Place?  -     Family Communication: discussed with wife Disposition Plan:    Patient remained in the hospital, currently unsafe to discharge, requires continued physical therapy for gait mobility balance strength and transfer training. Consults called: None Admission status: Inpatient   Consultants:   cardiology  Procedures:  Ct Head Wo Contrast  Result Date: 05/13/2019 CLINICAL DATA:  Syncope, weakness, dizziness EXAM: CT HEAD WITHOUT CONTRAST TECHNIQUE: Contiguous axial images were obtained from the base of the skull through the vertex without intravenous contrast. COMPARISON:  01/25/2017, 08/05/2009 FINDINGS: Brain: No evidence of acute infarction, hemorrhage, extra-axial collection, ventriculomegaly, or mass effect. Generalized cerebral atrophy. Periventricular white matter low attenuation likely secondary to microangiopathy. Vascular: Cerebrovascular atherosclerotic calcifications are noted. Skull: Negative for fracture or  focal lesion. Sinuses/Orbits:  Visualized portions of the orbits are unremarkable. Visualized portions of the paranasal sinuses and mastoid air cells are unremarkable. Other: Partially visualized sebaceous cyst in the posterior scalp. IMPRESSION: No acute intracranial pathology. Electronically Signed   By: Kathreen Devoid   On: 05/13/2019 17:43   Dg Chest Portable 1 View  Result Date: 05/13/2019 CLINICAL DATA:  Syncope, weakness, dizziness EXAM: PORTABLE CHEST 1 VIEW COMPARISON:  01/25/2017 FINDINGS: The heart size and mediastinal contours are within normal limits. Both lungs are clear. The visualized skeletal structures are unremarkable. IMPRESSION: No active disease. Electronically Signed   By: Kathreen Devoid   On: 05/13/2019 17:40     Antimicrobials:   none   Subjective: No acute events overnight. Patient still weak, fall risk  Objective: Vitals:   05/21/19 1609 05/21/19 1938 05/22/19 0332 05/22/19 0730  BP: 126/77 137/84 124/80 127/71  Pulse: 75 78 76 70  Resp: 19 18 18 19   Temp:  99 F (37.2 C) 98.8 F (37.1 C) 97.8 F (36.6 C)  TempSrc:  Oral Oral Oral  SpO2: 100% 99% 99% 100%  Weight:   82.1 kg   Height:        Intake/Output Summary (Last 24 hours) at 05/22/2019 1031 Last data filed at 05/22/2019 0900 Gross per 24 hour  Intake 600 ml  Output 200 ml  Net 400 ml   Filed Weights   05/19/19 0536 05/20/19 0509 05/22/19 0332  Weight: 81.4 kg 80.6 kg 82.1 kg    Examination:  General exam: Appears calm and comfortable  Respiratory system: Clear to auscultation. Respiratory effort normal. Cardiovascular system: S1 & S2 heard, RRR. No JVD, murmurs, rubs, gallops or clicks. No pedal edema. Gastrointestinal system: Abdomen is nondistended, soft and nontender. No organomegaly or masses felt. Normal bowel sounds heard. Central nervous system: Alert and oriented. No focal neurological deficits. Extremities: Warm well perfused trace edema no lesions Skin: No rashes, lesions or ulcers Psychiatry: Judgement  and insight appear chronically impaired in the setting of dementia. Mood & affect flat.     Data Reviewed: I have personally reviewed following labs and imaging studies  CBC: Recent Labs  Lab 05/17/19 1642 05/18/19 0551 05/19/19 0625 05/22/19 0543  WBC 9.9 11.8* 9.0 8.7  NEUTROABS 7.0  --   --  4.6  HGB 11.8* 11.9* 12.1* 10.5*  HCT 37.1* 36.2* 37.2* 32.1*  MCV 85.5 82.6 82.3 83.6  PLT 271 275 284 [id number]   Basic Metabolic Panel: Recent Labs  Lab 05/16/19 0428 05/17/19 1642 05/18/19 0551 05/19/19 0625 05/22/19 0543  NA 138 138 141 141 138  K 3.7 3.9 4.1 3.9 3.5  CL 106 104 109 107 107  CO2 24 25 21* 24 25  GLUCOSE 133* 141* 122* 138* 118*  BUN 15 20 21 23  28*  CREATININE 1.13 1.54* 1.34* 1.08 1.17  CALCIUM 8.8* 9.1 8.8* 9.1 8.5*   GFR: Estimated Creatinine Clearance: 56.3 mL/min (by C-G formula based on SCr of 1.17 mg/dL). Liver Function Tests: Recent Labs  Lab 05/17/19 1642  AST 18  ALT 5  ALKPHOS 54  BILITOT 0.8  PROT 7.1  ALBUMIN 3.3*   No results for input(s): LIPASE, AMYLASE in the last 168 hours. No results for input(s): AMMONIA in the last 168 hours. Coagulation Profile: No results for input(s): INR, PROTIME in the last 168 hours. Cardiac Enzymes: No results for input(s): CKTOTAL, CKMB, CKMBINDEX, TROPONINI in the last 168 hours. BNP (last 3 results) No results for input(s): PROBNP  in the last 8760 hours. HbA1C: No results for input(s): HGBA1C in the last 72 hours. CBG: Recent Labs  Lab 05/16/19 0535 05/16/19 0817  GLUCAP 129* 148*   Lipid Profile: No results for input(s): CHOL, HDL, LDLCALC, TRIG, CHOLHDL, LDLDIRECT in the last 72 hours. Thyroid Function Tests: No results for input(s): TSH, T4TOTAL, FREET4, T3FREE, THYROIDAB in the last 72 hours. Anemia Panel: No results for input(s): VITAMINB12, FOLATE, FERRITIN, TIBC, IRON, RETICCTPCT in the last 72 hours. Sepsis Labs: Recent Labs  Lab 05/17/19 1638  LATICACIDVEN 1.7    Recent  Results (from the past 240 hour(s))  SARS CORONAVIRUS 2 (TAT 6-24 HRS) Nasopharyngeal Nasopharyngeal Swab     Status: None   Collection Time: 05/13/19 11:55 PM   Specimen: Nasopharyngeal Swab  Result Value Ref Range Status   SARS Coronavirus 2 NEGATIVE NEGATIVE Final    Comment: (NOTE) SARS-CoV-2 target nucleic acids are NOT DETECTED. The SARS-CoV-2 RNA is generally detectable in upper and lower respiratory specimens during the acute phase of infection. Negative results do not preclude SARS-CoV-2 infection, do not rule out co-infections with other pathogens, and should not be used as the sole basis for treatment or other patient management decisions. Negative results must be combined with clinical observations, patient history, and epidemiological information. The expected result is Negative. Fact Sheet for Patients: SugarRoll.be Fact Sheet for Healthcare Providers: https://www.woods-mathews.com/ This test is not yet approved or cleared by the [place] FDA and  has been authorized for detection and/or diagnosis of SARS-CoV-2 by FDA under an Emergency Use Authorization (EUA). This EUA will remain  in effect (meaning this test can be used) for the duration of the COVID-19 declaration under Section 56 4(b)(1) of the Act, 21 U.S.C. section 360bbb-3(b)(1), unless the authorization is terminated or revoked sooner. Performed at [facility], [place] [address], [place], [place] [address]   Urine culture     Status: Abnormal   Collection Time: 05/17/19  4:42 PM   Specimen: Urine, Random  Result Value Ref Range Status   Specimen Description   Final    URINE, RANDOM Performed at [facility], [address], [place], [place] [address]    Special Requests   Final    NONE Performed at [facility], [address] [place], [place] [address]    Culture MULTIPLE SPECIES PRESENT, SUGGEST RECOLLECTION (A)  Final   Report  Status 05/18/2019 FINAL  Final  Culture, blood (routine x 2)     Status: None   Collection Time: 05/17/19  4:42 PM   Specimen: BLOOD  Result Value Ref Range Status   Specimen Description BLOOD RIGHT ANTECUBITAL  Final   Special Requests   Final    BOTTLES DRAWN AEROBIC AND ANAEROBIC Blood Culture adequate volume   Culture   Final    NO GROWTH 5 DAYS Performed at [facility], [address] [place], [place] [address]    Report Status 05/22/2019 FINAL  Final  Culture, blood (routine x 2)     Status: None   Collection Time: 05/17/19  4:42 PM   Specimen: BLOOD  Result Value Ref Range Status   Specimen Description BLOOD BLOOD LEFT ARM  Final   Special Requests   Final    BOTTLES DRAWN AEROBIC AND ANAEROBIC Blood Culture results may not be optimal due to an excessive volume of blood received in culture bottles   Culture   Final    NO GROWTH 5 DAYS Performed at [facility], [address] [place], [place] [address]    Report Status 05/22/2019 FINAL  Final  SARS CORONAVIRUS 2 (TAT 6-24 HRS) Nasopharyngeal Nasopharyngeal Swab     Status: None   Collection Time: 05/17/19  7:54 PM   Specimen: Nasopharyngeal Swab  Result Value Ref Range Status   SARS Coronavirus 2 NEGATIVE NEGATIVE Final    Comment: (NOTE) SARS-CoV-2 target nucleic acids are NOT DETECTED. The SARS-CoV-2 RNA is generally detectable in upper and lower respiratory specimens during the acute phase of infection. Negative results do not preclude SARS-CoV-2 infection, do not rule out co-infections with other pathogens, and should not be used as the sole basis for treatment or other patient management decisions. Negative results must be combined with clinical observations, patient history, and epidemiological information. The expected result is Negative. Fact Sheet for Patients: SugarRoll.be Fact Sheet for Healthcare Providers: https://www.woods-mathews.com/ This  test is not yet approved or cleared by the [place] FDA and  has been authorized for detection and/or diagnosis of SARS-CoV-2 by FDA under an Emergency Use Authorization (EUA). This EUA will remain  in effect (meaning this test can be used) for the duration of the COVID-19 declaration under Section 56 4(b)(1) of the Act, 21 U.S.C. section 360bbb-3(b)(1), unless the authorization is terminated or revoked sooner. Performed at [facility], [place] [address], [place], [place] [address]          Radiology Studies: No results found.      Scheduled Meds: . amLODipine  2.5 mg Oral Daily  . aspirin EC  81 mg Oral QODAY  . carbidopa-levodopa  1 tablet Oral Daily  . enoxaparin (LOVENOX) injection  40 mg Subcutaneous QHS  . metoprolol tartrate  25 mg Oral BID   Continuous Infusions:   LOS: 5 days    Time spent: 35 min    Nicolette Bang, MD [facility]  If 7PM-7AM, please contact night-coverage  05/22/2019, 10:31 AM

## 2019-05-23 ENCOUNTER — Telehealth: Payer: Self-pay | Admitting: Family Medicine

## 2019-05-23 MED ORDER — POLYETHYLENE GLYCOL 3350 17 G PO PACK: 17.0000 g | PACK | Freq: Every day | ORAL | 0 refills | Status: AC

## 2019-05-23 MED ORDER — METOPROLOL TARTRATE 25 MG PO TABS: 25.0000 mg | ORAL_TABLET | Freq: Two times a day (BID) | ORAL | 0 refills | Status: DC

## 2019-05-23 MED ORDER — AMLODIPINE BESYLATE 2.5 MG PO TABS: 2.5000 mg | ORAL_TABLET | Freq: Every day | ORAL | 0 refills | Status: DC

## 2019-05-23 NOTE — Telephone Encounter (Signed)
Called pt to do screening for appt on 11/5. I spoke to the pt's wife and she said that patient is back in the hospital. She states they want to send him to a rehab facility for 14-21 days since pt still can't walk. She said that his blood pressure is stabilized and they are hanging in there. She is requesting you give her a call. She said you can reach her on her cell phone since she will be going back to the hospital. I cancelled the appt for tomorrow.   CB [phone]

## 2019-05-23 NOTE — Discharge Summary (Addendum)
Physician Discharge Summary  Brandon Manning [id number] DOB: [birth year] DOA: 05/17/2019  PCP: [name], MD  Admit date: 05/17/2019 Discharge date: 05/24/2019  Recommendations for Outpatient Follow-up:  1. Discharge to SNF. 2. Follow up with PCP in 7-10 days after discharge from SNF 3. PT/OT at SNF. 4. BMP to be checked on 05/29/2019 and reported to facility physician.  Discharge Diagnoses: Principal diagnosis is #1 1. Labile blood pressures  2. Autonomic dysfunction 3. Parkinsons Disease 4. Chronic constipation.  Discharge Condition: Fair  Disposition: SNF  Diet recommendation: General  Filed Weights   05/20/19 0509 05/22/19 0332 05/23/19 0452  Weight: 80.6 kg 82.1 kg 79.7 kg    History of present illness:  Per admitting physician: [name] presents with chief complaint as above. Patient presents the ED with an episode of altered mental status. However, this is likely due to significant fluctuations in his blood pressure. Of late he has been having autonomic dysfunction, with orthostatic hypotension, but then resting hypertension. He is on both midodrine and antihypertensives to try to control this, but continues to have wild swings in his blood pressure. Hospitalist were called for admission for further evaluation  Hospital Course:  The patient was admitted to a telemetry bed. He was on both midodrine and metoprolol to control his BP. His midodrine was discontinued as it seemed to worsen hypertensive swings while hypotension did not seem as severe. The patient has been evaluated by PT/OT. Their recommendation is that the patient be discharged to SNF. There is a bed available today. The patient will be discharged to SNF today.  Today's assessment: S: The patient is resting comfortably. No new complaints. O: Vitals:  Vitals:   05/23/19 0759 05/23/19 1505  BP: (!) 157/93 113/65  Pulse: 71 77  Resp: 18 18  Temp: 98.2 F (36.8  C) 97.7 F (36.5 C)  SpO2: 100% 100%   Exam:  Constitutional:  . The patient is awake, but lethargic. No acute distress. Respiratory:  . No increased work of breathing. . No wheezes, rales, or rhonchi . No tactile fremitus Cardiovascular:  . Regular rate and rhythm . No murmurs, ectopy, or gallups. . No lateral PMI. No thrills. Abdomen:  . Abdomen is soft, non-tender, non-distended . No hernias, masses, or organomegaly . Normoactive bowel sounds.  Musculoskeletal:  . No cyanosis, clubbing, or edema Skin:  . No rashes, lesions, ulcers . palpation of skin: no induration or nodules Neurologic:  . CN 2-12 intact . Sensation all 4 extremities intact  Discharge Instructions  Discharge Instructions    Activity as tolerated - No restrictions   Complete by: As directed    Call MD for:  difficulty breathing, headache or visual disturbances   Complete by: As directed    Call MD for:  persistant dizziness or light-headedness   Complete by: As directed    Call MD for:  severe uncontrolled pain   Complete by: As directed    Diet general   Complete by: As directed    Discharge instructions   Complete by: As directed    Discharge to SNF. Follow up with PCP in 7-10 days. PT/OT at SNF. BMP to be checked on 05/29/2019 and reported to facility physician.   Increase activity slowly   Complete by: As directed      Allergies as of 05/23/2019      Reactions   Penicillins Rash   Has patient had a PCN reaction causing immediate rash, facial/tongue/throat swelling, SOB or lightheadedness with hypotension:  No Has patient had a PCN reaction causing severe rash involving mucus membranes or skin necrosis: No Has patient had a PCN reaction that required hospitalization No Has patient had a PCN reaction occurring within the last 10 years: No If all of the above answers are "NO", then may proceed with Cephalosporin use.      Medication List    STOP taking these medications   B-12 1000  MCG Subl   midodrine 5 MG tablet Commonly known as: PROAMATINE   mirabegron ER 25 MG Tb24 tablet Commonly known as: Myrbetriq     TAKE these medications   amLODipine 2.5 MG tablet Commonly known as: NORVASC Take 1 tablet (2.5 mg total) by mouth daily. Start taking on: May 24, 2019   aspirin EC 81 MG tablet Take 81 mg by mouth every other day.   carbidopa-levodopa 25-100 MG tablet Commonly known as: SINEMET IR 2 in the 7am, 2 at noon, 1 at 4pm   metoprolol tartrate 25 MG tablet Commonly known as: LOPRESSOR Take 1 tablet (25 mg total) by mouth 2 (two) times daily. What changed: how much to take   MULTIVITAMIN PO Take 1 tablet by mouth daily.   polyethylene glycol 17 g packet Commonly known as: MIRALAX / GLYCOLAX Take 17 g by mouth daily. Start taking on: May 24, 2019   Vitamin D 50 MCG (2000 UT) Caps Take 1 capsule (2,000 Units total) by mouth daily.      Allergies  Allergen Reactions  . Penicillins Rash    Has patient had a PCN reaction causing immediate rash, facial/tongue/throat swelling, SOB or lightheadedness with hypotension: No Has patient had a PCN reaction causing severe rash involving mucus membranes or skin necrosis: No Has patient had a PCN reaction that required hospitalization No Has patient had a PCN reaction occurring within the last 10 years: No If all of the above answers are "NO", then may proceed with Cephalosporin use.    The results of significant diagnostics from this hospitalization (including imaging, microbiology, ancillary and laboratory) are listed below for reference.    Significant Diagnostic Studies: Ct Head Wo Contrast  Result Date: 05/13/2019 CLINICAL DATA:  Syncope, weakness, dizziness EXAM: CT HEAD WITHOUT CONTRAST TECHNIQUE: Contiguous axial images were obtained from the base of the skull through the vertex without intravenous contrast. COMPARISON:  01/25/2017, 08/05/2009 FINDINGS: Brain: No evidence of acute  infarction, hemorrhage, extra-axial collection, ventriculomegaly, or mass effect. Generalized cerebral atrophy. Periventricular white matter low attenuation likely secondary to microangiopathy. Vascular: Cerebrovascular atherosclerotic calcifications are noted. Skull: Negative for fracture or focal lesion. Sinuses/Orbits: Visualized portions of the orbits are unremarkable. Visualized portions of the paranasal sinuses and mastoid air cells are unremarkable. Other: Partially visualized sebaceous cyst in the posterior scalp. IMPRESSION: No acute intracranial pathology. Electronically Signed   By: Kathreen Devoid   On: 05/13/2019 17:43   Dg Chest Portable 1 View  Result Date: 05/13/2019 CLINICAL DATA:  Syncope, weakness, dizziness EXAM: PORTABLE CHEST 1 VIEW COMPARISON:  01/25/2017 FINDINGS: The heart size and mediastinal contours are within normal limits. Both lungs are clear. The visualized skeletal structures are unremarkable. IMPRESSION: No active disease. Electronically Signed   By: Kathreen Devoid   On: 05/13/2019 17:40    Microbiology: Recent Results (from the past 240 hour(s))  SARS CORONAVIRUS 2 (TAT 6-24 HRS) Nasopharyngeal Nasopharyngeal Swab     Status: None   Collection Time: 05/13/19 11:55 PM   Specimen: Nasopharyngeal Swab  Result Value Ref Range Status   SARS  Coronavirus 2 NEGATIVE NEGATIVE Final    Comment: (NOTE) SARS-CoV-2 target nucleic acids are NOT DETECTED. The SARS-CoV-2 RNA is generally detectable in upper and lower respiratory specimens during the acute phase of infection. Negative results do not preclude SARS-CoV-2 infection, do not rule out co-infections with other pathogens, and should not be used as the sole basis for treatment or other patient management decisions. Negative results must be combined with clinical observations, patient history, and epidemiological information. The expected result is Negative. Fact Sheet for Patients:  SugarRoll.be Fact Sheet for Healthcare Providers: https://www.woods-mathews.com/ This test is not yet approved or cleared by the [place] FDA and  has been authorized for detection and/or diagnosis of SARS-CoV-2 by FDA under an Emergency Use Authorization (EUA). This EUA will remain  in effect (meaning this test can be used) for the duration of the COVID-19 declaration under Section 56 4(b)(1) of the Act, 21 U.S.C. section 360bbb-3(b)(1), unless the authorization is terminated or revoked sooner. Performed at [facility], [place] [address] [place], [place] [address]   Urine culture     Status: Abnormal   Collection Time: 05/17/19  4:42 PM   Specimen: Urine, Random  Result Value Ref Range Status   Specimen Description   Final    URINE, RANDOM Performed at [facility], [address], [place], [place] [address]    Special Requests   Final    NONE Performed at [facility], [address] [place], [place] [address]    Culture MULTIPLE SPECIES PRESENT, SUGGEST RECOLLECTION (A)  Final   Report Status 05/18/2019 FINAL  Final  Culture, blood (routine x 2)     Status: None   Collection Time: 05/17/19  4:42 PM   Specimen: BLOOD  Result Value Ref Range Status   Specimen Description BLOOD RIGHT ANTECUBITAL  Final   Special Requests   Final    BOTTLES DRAWN AEROBIC AND ANAEROBIC Blood Culture adequate volume   Culture   Final    NO GROWTH 5 DAYS Performed at [facility], [address] [place], [place] [address]    Report Status 05/22/2019 FINAL  Final  Culture, blood (routine x 2)     Status: None   Collection Time: 05/17/19  4:42 PM   Specimen: BLOOD  Result Value Ref Range Status   Specimen Description BLOOD BLOOD LEFT ARM  Final   Special Requests   Final    BOTTLES DRAWN AEROBIC AND ANAEROBIC Blood Culture results may not be optimal due to an excessive volume of blood received in culture bottles    Culture   Final    NO GROWTH 5 DAYS Performed at [facility], [address], [place], [place] [address]    Report Status 05/22/2019 FINAL  Final  SARS CORONAVIRUS 2 (TAT 6-24 HRS) Nasopharyngeal Nasopharyngeal Swab     Status: None   Collection Time: 05/17/19  7:54 PM   Specimen: Nasopharyngeal Swab  Result Value Ref Range Status   SARS Coronavirus 2 NEGATIVE NEGATIVE Final    Comment: (NOTE) SARS-CoV-2 target nucleic acids are NOT DETECTED. The SARS-CoV-2 RNA is generally detectable in upper and lower respiratory specimens during the acute phase of infection. Negative results do not preclude SARS-CoV-2 infection, do not rule out co-infections with other pathogens, and should not be used as the sole basis for treatment or other patient management decisions. Negative results must be combined with clinical observations, patient history, and epidemiological information. The expected result is Negative. Fact  Sheet for Patients: SugarRoll.be Fact Sheet for Healthcare Providers: https://www.woods-mathews.com/ This test is not yet approved or cleared by the [place] FDA and  has been authorized for detection and/or diagnosis of SARS-CoV-2 by FDA under an Emergency Use Authorization (EUA). This EUA will remain  in effect (meaning this test can be used) for the duration of the COVID-19 declaration under Section 56 4(b)(1) of the Act, 21 U.S.C. section 360bbb-3(b)(1), unless the authorization is terminated or revoked sooner. Performed at [facility], [place] [address] [place], [place] [address]      Labs: Basic Metabolic Panel: Recent Labs  Lab 05/17/19 1642 05/18/19 0551 05/19/19 0625 05/22/19 0543  NA 138 141 141 138  K 3.9 4.1 3.9 3.5  CL 104 109 107 107  CO2 25 21* 24 25  GLUCOSE 141* 122* 138* 118*  BUN 20 21 23  28*  CREATININE 1.54* 1.34* 1.08 1.17  CALCIUM 9.1 8.8* 9.1 8.5*   Liver Function Tests:  Recent Labs  Lab 05/17/19 1642  AST 18  ALT 5  ALKPHOS 54  BILITOT 0.8  PROT 7.1  ALBUMIN 3.3*   No results for input(s): LIPASE, AMYLASE in the last 168 hours. No results for input(s): AMMONIA in the last 168 hours. CBC: Recent Labs  Lab 05/17/19 1642 05/18/19 0551 05/19/19 0625 05/22/19 0543  WBC 9.9 11.8* 9.0 8.7  NEUTROABS 7.0  --   --  4.6  HGB 11.8* 11.9* 12.1* 10.5*  HCT 37.1* 36.2* 37.2* 32.1*  MCV 85.5 82.6 82.3 83.6  PLT 271 275 284 275   Cardiac Enzymes: No results for input(s): CKTOTAL, CKMB, CKMBINDEX, TROPONINI in the last 168 hours. BNP: BNP (last 3 results) No results for input(s): BNP in the last 8760 hours.  ProBNP (last 3 results) No results for input(s): PROBNP in the last 8760 hours.  CBG: No results for input(s): GLUCAP in the last 168 hours.  Principal Problem:   Autonomic dysfunction Active Problems:   HTN (hypertension)   BPH (benign prostatic hyperplasia)   Parkinson disease (HCC)   Chronic constipation   Time coordinating discharge: 38 minutes.  Signed:        Bart Ashford, DO [facility]  05/24/2019, 10:21 AM  ADDENDUM: This patient was discharged on 05/23/2019. However, SNF was unable to accept the patient that day as they stated that they could not get his medications in time. He will discharge this morning.

## 2019-05-23 NOTE — TOC Progression Note (Signed)
Transition of Care [facility]) - Progression Note    Patient Details  Name: REDRICK NORDAHL MRN: [id number] Date of Birth: 04-20-1946  Transition of Care [facility]) CM/SW Contact  [name], [place] Phone Number: 05/23/2019, 1:06 PM  Clinical Narrative:     CSW received phone call from [facility] that they have received insurance authorization.  CSW updated physician and bedside nurse.   Expected Discharge Plan: [address] Barriers to Discharge: Continued Medical Work up  Expected Discharge Plan and Services Expected Discharge Plan: [place] Choice: [place] arrangements for the past 2 months: Single Family Home                                       Social Determinants of Health (SDOH) Interventions    Readmission Risk Interventions No flowsheet data found.

## 2019-05-23 NOTE — TOC Progression Note (Signed)
Transition of Care [facility]) - Progression Note    Patient Details  Name: Brandon Manning MRN: [id number] Date of Birth: 1946-04-04  Transition of Care [facility]) CM/SW Contact  [name], [place] Phone Number: 05/23/2019, 4:13 PM  Clinical Narrative:     CSW spoke to [facility], and they said they are not able to get medications for patient tonight and are unable to accept patient today, they will have to accept him in the morning.  CSW updated physician, and bedside nurse.   Expected Discharge Plan: [place] Barriers to Discharge: Continued Medical Work up  Expected Discharge Plan and Services Expected Discharge Plan: [place] Choice: [place] arrangements for the past 2 months: Single Family Home Expected Discharge Date: 05/23/19                                     Social Determinants of Health (SDOH) Interventions    Readmission Risk Interventions No flowsheet data found.

## 2019-05-24 ENCOUNTER — Ambulatory Visit: Payer: Medicare HMO | Admitting: Family Medicine

## 2019-05-24 DIAGNOSIS — Z8261 Family history of arthritis: Secondary | ICD-10-CM | POA: Diagnosis not present

## 2019-05-24 DIAGNOSIS — D649 Anemia, unspecified: Secondary | ICD-10-CM | POA: Diagnosis not present

## 2019-05-24 DIAGNOSIS — N400 Enlarged prostate without lower urinary tract symptoms: Secondary | ICD-10-CM | POA: Diagnosis not present

## 2019-05-24 DIAGNOSIS — R4182 Altered mental status, unspecified: Secondary | ICD-10-CM | POA: Diagnosis not present

## 2019-05-24 DIAGNOSIS — E559 Vitamin D deficiency, unspecified: Secondary | ICD-10-CM | POA: Diagnosis not present

## 2019-05-24 DIAGNOSIS — Z841 Family history of disorders of kidney and ureter: Secondary | ICD-10-CM | POA: Diagnosis not present

## 2019-05-24 DIAGNOSIS — I951 Orthostatic hypotension: Secondary | ICD-10-CM | POA: Diagnosis not present

## 2019-05-24 DIAGNOSIS — E039 Hypothyroidism, unspecified: Secondary | ICD-10-CM | POA: Diagnosis not present

## 2019-05-24 DIAGNOSIS — Z7401 Bed confinement status: Secondary | ICD-10-CM | POA: Diagnosis not present

## 2019-05-24 DIAGNOSIS — R531 Weakness: Secondary | ICD-10-CM | POA: Diagnosis not present

## 2019-05-24 DIAGNOSIS — R55 Syncope and collapse: Secondary | ICD-10-CM | POA: Diagnosis not present

## 2019-05-24 DIAGNOSIS — R7989 Other specified abnormal findings of blood chemistry: Secondary | ICD-10-CM | POA: Diagnosis not present

## 2019-05-24 DIAGNOSIS — G9341 Metabolic encephalopathy: Secondary | ICD-10-CM | POA: Diagnosis not present

## 2019-05-24 DIAGNOSIS — F0280 Dementia in other diseases classified elsewhere without behavioral disturbance: Secondary | ICD-10-CM | POA: Diagnosis not present

## 2019-05-24 DIAGNOSIS — Z03818 Encounter for observation for suspected exposure to other biological agents ruled out: Secondary | ICD-10-CM | POA: Diagnosis not present

## 2019-05-24 DIAGNOSIS — N3941 Urge incontinence: Secondary | ICD-10-CM | POA: Diagnosis not present

## 2019-05-24 DIAGNOSIS — N3000 Acute cystitis without hematuria: Secondary | ICD-10-CM | POA: Diagnosis not present

## 2019-05-24 DIAGNOSIS — Z20828 Contact with and (suspected) exposure to other viral communicable diseases: Secondary | ICD-10-CM | POA: Diagnosis not present

## 2019-05-24 DIAGNOSIS — Z833 Family history of diabetes mellitus: Secondary | ICD-10-CM | POA: Diagnosis not present

## 2019-05-24 DIAGNOSIS — Z880 Allergy status to penicillin: Secondary | ICD-10-CM | POA: Diagnosis not present

## 2019-05-24 DIAGNOSIS — R4020 Unspecified coma: Secondary | ICD-10-CM | POA: Diagnosis not present

## 2019-05-24 DIAGNOSIS — N401 Enlarged prostate with lower urinary tract symptoms: Secondary | ICD-10-CM | POA: Diagnosis not present

## 2019-05-24 DIAGNOSIS — N390 Urinary tract infection, site not specified: Secondary | ICD-10-CM | POA: Diagnosis not present

## 2019-05-24 DIAGNOSIS — Z7982 Long term (current) use of aspirin: Secondary | ICD-10-CM | POA: Diagnosis not present

## 2019-05-24 DIAGNOSIS — G20 Parkinson's disease: Secondary | ICD-10-CM | POA: Diagnosis not present

## 2019-05-24 DIAGNOSIS — K5900 Constipation, unspecified: Secondary | ICD-10-CM | POA: Diagnosis present

## 2019-05-24 DIAGNOSIS — E0590 Thyrotoxicosis, unspecified without thyrotoxic crisis or storm: Secondary | ICD-10-CM | POA: Diagnosis not present

## 2019-05-24 DIAGNOSIS — M2550 Pain in unspecified joint: Secondary | ICD-10-CM | POA: Diagnosis not present

## 2019-05-24 DIAGNOSIS — I159 Secondary hypertension, unspecified: Secondary | ICD-10-CM | POA: Diagnosis not present

## 2019-05-24 DIAGNOSIS — Z87891 Personal history of nicotine dependence: Secondary | ICD-10-CM | POA: Diagnosis not present

## 2019-05-24 DIAGNOSIS — Z23 Encounter for immunization: Secondary | ICD-10-CM | POA: Diagnosis not present

## 2019-05-24 DIAGNOSIS — R29898 Other symptoms and signs involving the musculoskeletal system: Secondary | ICD-10-CM | POA: Diagnosis not present

## 2019-05-24 DIAGNOSIS — R404 Transient alteration of awareness: Secondary | ICD-10-CM | POA: Diagnosis not present

## 2019-05-24 DIAGNOSIS — G9340 Encephalopathy, unspecified: Secondary | ICD-10-CM | POA: Diagnosis not present

## 2019-05-24 DIAGNOSIS — E538 Deficiency of other specified B group vitamins: Secondary | ICD-10-CM | POA: Diagnosis not present

## 2019-05-24 DIAGNOSIS — I10 Essential (primary) hypertension: Secondary | ICD-10-CM | POA: Diagnosis not present

## 2019-05-24 DIAGNOSIS — N179 Acute kidney failure, unspecified: Secondary | ICD-10-CM | POA: Diagnosis not present

## 2019-05-24 DIAGNOSIS — Z8249 Family history of ischemic heart disease and other diseases of the circulatory system: Secondary | ICD-10-CM | POA: Diagnosis not present

## 2019-05-24 DIAGNOSIS — Z79899 Other long term (current) drug therapy: Secondary | ICD-10-CM | POA: Diagnosis not present

## 2019-05-24 DIAGNOSIS — G909 Disorder of the autonomic nervous system, unspecified: Secondary | ICD-10-CM | POA: Diagnosis not present

## 2019-05-24 LAB — CREATININE, SERUM
Creatinine, Ser: 1.12 mg/dL (ref 0.61–1.24)
GFR calc Af Amer: >60 mL/min (ref >60)
GFR calc non Af Amer: >60 mL/min (ref >60)

## 2019-05-24 NOTE — Telephone Encounter (Signed)
Noted. Left message on answering machine for wife.  Looks like pt is being discharged to [facility] today.

## 2019-05-24 NOTE — Care Management Important Message (Signed)
Important Message  Patient Details  Name: Brandon Manning MRN: [id number] Date of Birth: Nov 07, 1945   Medicare Important Message Given:  Yes     Dannette Barbara 05/24/2019, 1:26 PM

## 2019-05-24 NOTE — Progress Notes (Signed)
Report called to [place]. EMS called for transport.

## 2019-05-24 NOTE — Progress Notes (Signed)
Pt left via stretcher with EMS going to [facility]. Personal belongings taken home with spouse.

## 2019-05-24 NOTE — Progress Notes (Signed)
PROGRESS NOTE  Brandon Manning [id number] DOB: 16-Jan-1946 DOA: 05/17/2019 PCP: [name], MD  Brief History   The patient was admitted to a telemetry bed. He was on both midodrine and metoprolol to control his BP. His midodrine was discontinued as it seemed to worsen hypertensive swings while hypotension did not seem as severe. The patient has been evaluated by PT/OT. Their recommendation is that the patient be discharged to SNF. There is a bed available today. The patient will be discharged to SNF tomorrow.  Consultants  . Neurology  Procedures  . None  Antibiotics   Anti-infectives (From admission, onward)   None    .  Subjective  The patient is resting comfortably. No new complaints.  Objective   Vitals:  Vitals:   05/24/19 0433 05/24/19 0823  BP: (!) 169/99 (!) 141/86  Pulse: 71 74  Resp: 20 18  Temp: 98.2 F (36.8 C) 98.4 F (36.9 C)  SpO2: 100% 100%    Exam:  Constitutional:  . Appears calm and comfortable Respiratory:  . CTA bilaterally, no w/r/r.  . Respiratory effort normal. No retractions or accessory muscle use Cardiovascular:  . RRR, no m/r/g . No LE extremity edema   . Normal pedal pulses Abdomen:  . Abdomen appears normal; no tenderness or masses . No hernias . No HSM Musculoskeletal:  . Digits/nails BUE: no clubbing, cyanosis, petechiae, infection . exam of joints, bones, muscles of at least one of following: head/neck, RUE, LUE, RLE, LLE   o strength and tone normal, no atrophy, no abnormal movements o No tenderness, masses o Normal ROM, no contractures  . gait and station Skin:  . No rashes, lesions, ulcers . palpation of skin: no induration or nodules Neurologic:  . CN 2-12 intact . Sensation all 4 extremities intact   I have personally reviewed the following:   Today's Data  . Vitals  Scheduled Meds: . amLODipine  2.5 mg Oral Daily  . aspirin EC  81 mg Oral QODAY  . carbidopa-levodopa  1 tablet Oral Daily  .  enoxaparin (LOVENOX) injection  40 mg Subcutaneous QHS  . metoprolol tartrate  25 mg Oral BID  . polyethylene glycol  17 g Oral Daily   Continuous Infusions:  Principal Problem:   Autonomic dysfunction Active Problems:   HTN (hypertension)   BPH (benign prostatic hyperplasia)   Parkinson disease (HCC)   Chronic constipation   LOS: 7 days   A & P  Autonomic dysfunction with fluctuating blood pressures. Labile due to parkinsons disease. The patient has been continued on metoprolol 25 mg bid and amlodipine 2.5 daily. Midodrine has been stopped as blood pressures here have tended to be on the high side. No low blood pressures in last 24 hours.. Echo had no acute findings. Neurology consulted and following.  AKI: The patient has received gentle hydration with IV fluids. He is clinically improved with a creatinine of 1.17 on the morning of 05/22/2019 with potassium of 3.5 . Monitor.g  Parkinson's disease:  Continue Sinemet as at home. Neurology has been consulted. Dr. Berdie Ogren seen the patient. No need to adjust the Parkinson medications at this time. PT saw the patient in consultation with recommendations for skilled nursing facility for intensive therapy posthospitalization for transfers, gait, mobility, strength, and balance therapy. Tedswereplaced to help with orthostasis so patient may tolerate standing and therapy better.  Chronic constipation: Continue patient's home medications.  DVT prophylaxis: Lovenox SQ  Code Status: Full Family Communication: None available Disposition Plan: SNF  Emmani Lesueur, DO [facility] Direct contact: see [email or web address]  7PM-7AM contact night coverage as above 05/23/2019, 3:28 PM  LOS: 7 days

## 2019-05-24 NOTE — Plan of Care (Signed)
  Problem: Clinical Measurements: Goal: Ability to maintain clinical measurements within normal limits will improve Outcome: Progressing Goal: Diagnostic test results will improve Outcome: Progressing Goal: Respiratory complications will improve Outcome: Progressing Goal: Cardiovascular complication will be avoided Outcome: Progressing   

## 2019-05-24 NOTE — TOC Transition Note (Signed)
Transition of Care [facility]) - CM/SW Discharge Note   Patient Details  Name: Brandon Manning MRN: [id number] Date of Birth: 1945/11/28  Transition of Care [facility]) CM/SW Contact:  Candie Chroman, LCSW Phone Number: 05/24/2019, 10:53 AM   Clinical Narrative: Patient has orders to discharge to [facility] today. RN will call report to [phone] prior to setting up EMS transport. No further concerns. CSW signing off.    Final next level of care: Skilled Nursing Facility Barriers to Discharge: Barriers Resolved   Patient Goals and CMS Choice   CMS Medicare.gov Compare Post Acute Care list provided to:: Patient Choice offered to / list presented to : Spouse  Discharge Placement PASRR number recieved: 05/20/19            Patient chooses bed at: [facility] Patient to be transferred to facility by: EMS Name of family member notified: [name] Patient and family notified of of transfer: 05/24/19  Discharge Plan and Services     Post Acute Care Choice: [place]                               Social Determinants of Health (SDOH) Interventions     Readmission Risk Interventions No flowsheet data found.

## 2019-05-24 NOTE — Progress Notes (Signed)
Discharge instructions provided to pts wife/ iv and tele removed/ K[place], RN to call report to [facility]/ EMS called to transport

## 2019-05-25 DIAGNOSIS — E0590 Thyrotoxicosis, unspecified without thyrotoxic crisis or storm: Secondary | ICD-10-CM | POA: Diagnosis not present

## 2019-05-25 DIAGNOSIS — I10 Essential (primary) hypertension: Secondary | ICD-10-CM | POA: Diagnosis not present

## 2019-05-25 DIAGNOSIS — E039 Hypothyroidism, unspecified: Secondary | ICD-10-CM | POA: Diagnosis not present

## 2019-05-25 DIAGNOSIS — D649 Anemia, unspecified: Secondary | ICD-10-CM | POA: Diagnosis not present

## 2019-05-25 NOTE — Telephone Encounter (Addendum)
Spoke with wife. Pt is now at [facility] 14 d rehab stay.  [facility] to get him placed including 2 hospital admissions.

## 2019-05-28 DIAGNOSIS — I951 Orthostatic hypotension: Secondary | ICD-10-CM | POA: Diagnosis not present

## 2019-05-28 DIAGNOSIS — R531 Weakness: Secondary | ICD-10-CM | POA: Diagnosis not present

## 2019-05-28 DIAGNOSIS — G20 Parkinson's disease: Secondary | ICD-10-CM | POA: Diagnosis not present

## 2019-05-28 DIAGNOSIS — I10 Essential (primary) hypertension: Secondary | ICD-10-CM | POA: Diagnosis not present

## 2019-05-29 ENCOUNTER — Other Ambulatory Visit: Payer: Self-pay

## 2019-05-29 ENCOUNTER — Emergency Department: Payer: Medicare HMO

## 2019-05-29 ENCOUNTER — Inpatient Hospital Stay
Admission: EM | Admit: 2019-05-29 | Discharge: 2019-06-04 | DRG: 689 | Disposition: A | Discharge status: Skilled Nursing Facility | Payer: Medicare HMO | Attending: Internal Medicine | Admitting: Internal Medicine

## 2019-05-29 DIAGNOSIS — G9341 Metabolic encephalopathy: Secondary | ICD-10-CM | POA: Diagnosis present

## 2019-05-29 DIAGNOSIS — G20 Parkinson's disease: Secondary | ICD-10-CM | POA: Diagnosis present

## 2019-05-29 DIAGNOSIS — N3000 Acute cystitis without hematuria: Secondary | ICD-10-CM

## 2019-05-29 DIAGNOSIS — G909 Disorder of the autonomic nervous system, unspecified: Secondary | ICD-10-CM | POA: Diagnosis present

## 2019-05-29 DIAGNOSIS — Z8249 Family history of ischemic heart disease and other diseases of the circulatory system: Secondary | ICD-10-CM | POA: Diagnosis not present

## 2019-05-29 DIAGNOSIS — Z20828 Contact with and (suspected) exposure to other viral communicable diseases: Secondary | ICD-10-CM | POA: Diagnosis present

## 2019-05-29 DIAGNOSIS — Z7401 Bed confinement status: Secondary | ICD-10-CM | POA: Diagnosis not present

## 2019-05-29 DIAGNOSIS — R52 Pain, unspecified: Secondary | ICD-10-CM | POA: Diagnosis not present

## 2019-05-29 DIAGNOSIS — N401 Enlarged prostate with lower urinary tract symptoms: Secondary | ICD-10-CM | POA: Diagnosis not present

## 2019-05-29 DIAGNOSIS — Z880 Allergy status to penicillin: Secondary | ICD-10-CM

## 2019-05-29 DIAGNOSIS — I159 Secondary hypertension, unspecified: Secondary | ICD-10-CM | POA: Diagnosis not present

## 2019-05-29 DIAGNOSIS — G9340 Encephalopathy, unspecified: Secondary | ICD-10-CM

## 2019-05-29 DIAGNOSIS — F0280 Dementia in other diseases classified elsewhere without behavioral disturbance: Secondary | ICD-10-CM

## 2019-05-29 DIAGNOSIS — Z7982 Long term (current) use of aspirin: Secondary | ICD-10-CM

## 2019-05-29 DIAGNOSIS — N3941 Urge incontinence: Secondary | ICD-10-CM | POA: Diagnosis not present

## 2019-05-29 DIAGNOSIS — Z8261 Family history of arthritis: Secondary | ICD-10-CM | POA: Diagnosis not present

## 2019-05-29 DIAGNOSIS — E559 Vitamin D deficiency, unspecified: Secondary | ICD-10-CM | POA: Diagnosis not present

## 2019-05-29 DIAGNOSIS — Z87891 Personal history of nicotine dependence: Secondary | ICD-10-CM

## 2019-05-29 DIAGNOSIS — I10 Essential (primary) hypertension: Secondary | ICD-10-CM | POA: Diagnosis present

## 2019-05-29 DIAGNOSIS — R4182 Altered mental status, unspecified: Secondary | ICD-10-CM | POA: Diagnosis not present

## 2019-05-29 DIAGNOSIS — N179 Acute kidney failure, unspecified: Secondary | ICD-10-CM | POA: Diagnosis not present

## 2019-05-29 DIAGNOSIS — N400 Enlarged prostate without lower urinary tract symptoms: Secondary | ICD-10-CM | POA: Diagnosis not present

## 2019-05-29 DIAGNOSIS — N390 Urinary tract infection, site not specified: Principal | ICD-10-CM | POA: Diagnosis present

## 2019-05-29 DIAGNOSIS — K5900 Constipation, unspecified: Secondary | ICD-10-CM | POA: Diagnosis present

## 2019-05-29 DIAGNOSIS — Z79899 Other long term (current) drug therapy: Secondary | ICD-10-CM

## 2019-05-29 DIAGNOSIS — E538 Deficiency of other specified B group vitamins: Secondary | ICD-10-CM | POA: Diagnosis not present

## 2019-05-29 DIAGNOSIS — R4020 Unspecified coma: Secondary | ICD-10-CM | POA: Diagnosis not present

## 2019-05-29 DIAGNOSIS — Z03818 Encounter for observation for suspected exposure to other biological agents ruled out: Secondary | ICD-10-CM | POA: Diagnosis not present

## 2019-05-29 DIAGNOSIS — Z841 Family history of disorders of kidney and ureter: Secondary | ICD-10-CM | POA: Diagnosis not present

## 2019-05-29 DIAGNOSIS — M2550 Pain in unspecified joint: Secondary | ICD-10-CM | POA: Diagnosis not present

## 2019-05-29 DIAGNOSIS — Z833 Family history of diabetes mellitus: Secondary | ICD-10-CM | POA: Diagnosis not present

## 2019-05-29 DIAGNOSIS — R7989 Other specified abnormal findings of blood chemistry: Secondary | ICD-10-CM | POA: Diagnosis not present

## 2019-05-29 DIAGNOSIS — R404 Transient alteration of awareness: Secondary | ICD-10-CM | POA: Diagnosis not present

## 2019-05-29 DIAGNOSIS — R55 Syncope and collapse: Secondary | ICD-10-CM | POA: Diagnosis not present

## 2019-05-29 LAB — COMPREHENSIVE METABOLIC PANEL
ALT: 6 U/L (ref 0–44)
AST: 24 U/L (ref 15–41)
Albumin: 3.6 g/dL (ref 3.5–5.0)
Alkaline Phosphatase: 61 U/L (ref 38–126)
Anion gap: 9 (ref 5–15)
BUN: 26 mg/dL — ABNORMAL HIGH (ref 8–23)
CO2: 24 mmol/L (ref 22–32)
Calcium: 9.3 mg/dL (ref 8.9–10.3)
Chloride: 103 mmol/L (ref 98–111)
Creatinine, Ser: 1.67 mg/dL — ABNORMAL HIGH (ref 0.61–1.24)
GFR calc Af Amer: 46 mL/min — ABNORMAL LOW (ref >60)
GFR calc non Af Amer: 39 mL/min — ABNORMAL LOW (ref >60)
Glucose, Bld: 143 mg/dL — ABNORMAL HIGH (ref 70–99)
Potassium: 4.1 mmol/L (ref 3.5–5.1)
Sodium: 136 mmol/L (ref 135–145)
Total Bilirubin: 0.9 mg/dL (ref 0.3–1.2)
Total Protein: 7.5 g/dL (ref 6.5–8.1)

## 2019-05-29 LAB — URINALYSIS, COMPLETE (UACMP) WITH MICROSCOPIC
Bilirubin Urine: NEGATIVE
Glucose, UA: NEGATIVE mg/dL
Ketones, ur: 5 mg/dL — AB
Nitrite: NEGATIVE
Protein, ur: 30 mg/dL — AB
RBC / HPF: >50 RBC/hpf — ABNORMAL HIGH (ref 0–5)
Specific Gravity, Urine: 1.019 (ref 1.005–1.030)
WBC, UA: >50 WBC/hpf — ABNORMAL HIGH (ref 0–5)
pH: 5 (ref 5.0–8.0)

## 2019-05-29 LAB — CBC WITH DIFFERENTIAL/PLATELET
Abs Immature Granulocytes: 0.11 10*3/uL — ABNORMAL HIGH (ref 0.00–0.07)
Basophils Absolute: 0 10*3/uL (ref 0.0–0.1)
Basophils Relative: 0 %
Eosinophils Absolute: 0 10*3/uL (ref 0.0–0.5)
Eosinophils Relative: 0 %
HCT: 36 % — ABNORMAL LOW (ref 39.0–52.0)
Hemoglobin: 11.6 g/dL — ABNORMAL LOW (ref 13.0–17.0)
Immature Granulocytes: 1 %
Lymphocytes Relative: 13 %
Lymphs Abs: 2.4 10*3/uL (ref 0.7–4.0)
MCH: 27.2 pg (ref 26.0–34.0)
MCHC: 32.2 g/dL (ref 30.0–36.0)
MCV: 84.3 fL (ref 80.0–100.0)
Monocytes Absolute: 1.5 10*3/uL — ABNORMAL HIGH (ref 0.1–1.0)
Monocytes Relative: 8 %
Neutro Abs: 14.1 10*3/uL — ABNORMAL HIGH (ref 1.7–7.7)
Neutrophils Relative %: 78 %
Platelets: 355 10*3/uL (ref 150–400)
RBC: 4.27 MIL/uL (ref 4.22–5.81)
RDW: 14.6 % (ref 11.5–15.5)
WBC: 18.3 10*3/uL — ABNORMAL HIGH (ref 4.0–10.5)
nRBC: 0 % (ref 0.0–0.2)

## 2019-05-29 LAB — LACTIC ACID, PLASMA: Lactic Acid, Venous: 0.9 mmol/L (ref 0.5–1.9)

## 2019-05-29 MED ORDER — SODIUM CHLORIDE 0.9 % IV SOLN
1.0000 g | INTRAVENOUS | Status: DC
  Administered 2019-05-30: 1 g via INTRAVENOUS
  Filled 2019-05-29: qty 1
  Filled 2019-05-29: qty 10

## 2019-05-29 MED ORDER — SODIUM CHLORIDE 0.9 % IV SOLN
1.0000 g | Freq: Once | INTRAVENOUS | Status: AC
  Administered 2019-05-29: 1 g via INTRAVENOUS
  Filled 2019-05-29: qty 10

## 2019-05-29 MED ORDER — POLYETHYLENE GLYCOL 3350 17 G PO PACK
17.0000 g | PACK | Freq: Every day | ORAL | Status: DC
  Administered 2019-05-30 – 2019-06-04 (×5): 17 g via ORAL
  Filled 2019-05-29 (×5): qty 1

## 2019-05-29 MED ORDER — SODIUM CHLORIDE 0.9 % IV SOLN
INTRAVENOUS | Status: DC
  Administered 2019-05-29 – 2019-06-01 (×4): via INTRAVENOUS

## 2019-05-29 MED ORDER — CARBIDOPA-LEVODOPA 25-100 MG PO TABS: 1.0000 | ORAL_TABLET | Freq: Three times a day (TID) | ORAL | Status: DC

## 2019-05-29 MED ORDER — LABETALOL HCL 5 MG/ML IV SOLN
10.0000 mg | INTRAVENOUS | Status: DC | PRN
  Administered 2019-05-30 – 2019-06-02 (×6): 10 mg via INTRAVENOUS
  Filled 2019-05-29 (×6): qty 4

## 2019-05-29 MED ORDER — ENOXAPARIN SODIUM 40 MG/0.4ML ~~LOC~~ SOLN
40.0000 mg | SUBCUTANEOUS | Status: DC
  Administered 2019-05-31 – 2019-06-04 (×5): 40 mg via SUBCUTANEOUS
  Filled 2019-05-29 (×6): qty 0.4

## 2019-05-29 MED ORDER — AMLODIPINE BESYLATE 5 MG PO TABS
2.5000 mg | ORAL_TABLET | Freq: Every day | ORAL | Status: DC
  Administered 2019-05-30 – 2019-06-01 (×3): 2.5 mg via ORAL
  Filled 2019-05-29 (×3): qty 1

## 2019-05-29 MED ORDER — ASPIRIN EC 81 MG PO TBEC
81.0000 mg | DELAYED_RELEASE_TABLET | ORAL | Status: DC
  Administered 2019-05-31 – 2019-06-04 (×4): 81 mg via ORAL
  Filled 2019-05-29 (×4): qty 1

## 2019-05-29 MED ORDER — METOPROLOL TARTRATE 25 MG PO TABS
25.0000 mg | ORAL_TABLET | Freq: Two times a day (BID) | ORAL | Status: DC
  Administered 2019-05-29 – 2019-06-01 (×6): 25 mg via ORAL
  Filled 2019-05-29 (×7): qty 1

## 2019-05-29 MED ORDER — SODIUM CHLORIDE 0.9 % IV BOLUS
500.0000 mL | Freq: Once | INTRAVENOUS | Status: AC
  Administered 2019-05-29: 500 mL via INTRAVENOUS
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
## 2019-05-29 NOTE — ED Notes (Signed)
Pt being transported to CT
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
## 2019-05-29 NOTE — ED Triage Notes (Signed)
Pt arrives via ems from [facility]. Ems reports facility went in to do vitals and check on pt. Pt was unresponsive and drooling. When ems arrived pt was responsive to painful stimuli. Pt alert on arrival but still unable to verbally respond to questions. Pt normally independent walks with walker, and verbal. MD at bedside to assess pt

## 2019-05-29 NOTE — ED Notes (Signed)
Received report from [employer]

## 2019-05-30 ENCOUNTER — Telehealth: Payer: Self-pay

## 2019-05-30 DIAGNOSIS — N390 Urinary tract infection, site not specified: Principal | ICD-10-CM

## 2019-05-30 DIAGNOSIS — G9341 Metabolic encephalopathy: Secondary | ICD-10-CM

## 2019-05-30 DIAGNOSIS — G909 Disorder of the autonomic nervous system, unspecified: Secondary | ICD-10-CM

## 2019-05-30 LAB — CBC
HCT: 33.6 % — ABNORMAL LOW (ref 39.0–52.0)
Hemoglobin: 11 g/dL — ABNORMAL LOW (ref 13.0–17.0)
MCH: 27.1 pg (ref 26.0–34.0)
MCHC: 32.7 g/dL (ref 30.0–36.0)
MCV: 82.8 fL (ref 80.0–100.0)
Platelets: 322 10*3/uL (ref 150–400)
RBC: 4.06 MIL/uL — ABNORMAL LOW (ref 4.22–5.81)
RDW: 14.5 % (ref 11.5–15.5)
WBC: 16.3 10*3/uL — ABNORMAL HIGH (ref 4.0–10.5)
nRBC: 0 % (ref 0.0–0.2)

## 2019-05-30 LAB — BASIC METABOLIC PANEL
Anion gap: 7 (ref 5–15)
BUN: 20 mg/dL (ref 8–23)
CO2: 25 mmol/L (ref 22–32)
Calcium: 8.7 mg/dL — ABNORMAL LOW (ref 8.9–10.3)
Chloride: 110 mmol/L (ref 98–111)
Creatinine, Ser: 1.16 mg/dL (ref 0.61–1.24)
GFR calc Af Amer: >60 mL/min (ref >60)
GFR calc non Af Amer: >60 mL/min (ref >60)
Glucose, Bld: 126 mg/dL — ABNORMAL HIGH (ref 70–99)
Potassium: 3.7 mmol/L (ref 3.5–5.1)
Sodium: 142 mmol/L (ref 135–145)

## 2019-05-30 LAB — MRSA PCR SCREENING: MRSA by PCR: NEGATIVE

## 2019-05-30 LAB — LACTIC ACID, PLASMA: Lactic Acid, Venous: 1 mmol/L (ref 0.5–1.9)

## 2019-05-30 LAB — SARS CORONAVIRUS 2 (TAT 6-24 HRS): SARS Coronavirus 2: NEGATIVE

## 2019-05-30 MED ORDER — CARBIDOPA-LEVODOPA 25-100 MG PO TABS
1.0000 | ORAL_TABLET | ORAL | Status: DC
  Administered 2019-05-30 – 2019-06-03 (×5): 1 via ORAL
  Filled 2019-05-30 (×6): qty 1

## 2019-05-30 MED ORDER — CARBIDOPA-LEVODOPA 25-100 MG PO TABS
2.0000 | ORAL_TABLET | Freq: Two times a day (BID) | ORAL | Status: DC
  Administered 2019-05-30 – 2019-06-04 (×12): 2 via ORAL
  Filled 2019-05-30 (×13): qty 2

## 2019-05-30 NOTE — Progress Notes (Signed)
xcover Blood clot in urine Hold lovenox this am

## 2019-05-30 NOTE — ED Notes (Signed)
Floor unable to take report at this time.

## 2019-05-30 NOTE — ED Notes (Signed)
Pt resting quietly with eyes closed at this time, respirations equal and unlabored. Waiting for room assignment.

## 2019-05-30 NOTE — Plan of Care (Signed)
  Problem: Urinary Elimination: Goal: Signs and symptoms of infection will decrease Outcome: Progressing   Problem: Clinical Measurements: Goal: Ability to maintain clinical measurements within normal limits will improve Outcome: Progressing   Problem: Clinical Measurements: Goal: Diagnostic test results will improve Outcome: Progressing

## 2019-05-30 NOTE — Telephone Encounter (Signed)
Denise with [facility] left v/m that pt was admitted to hospital and has been discharged to rehab facility before palliative care could go out. Langley Gauss said no palliative care at this time. Langley Gauss does not need cb. FYI to Dr [name].

## 2019-05-30 NOTE — Progress Notes (Signed)
PROGRESS NOTE    Brandon Manning  [id number] DOB: 1946-03-21 DOA: 05/29/2019 PCP: [name], MD    Brief Narrative:  HPI per Dr.Tu Brandon Manning is a 73 y.o. male with medical history significant of Parkinson's disease, Autonomic dysfunction, hypertension, BPH who presents from outside facility for altered mental status.  Patient is alert and oriented to self and place but unable to provide history. Reportedly he was found by his facility to be responsive only to painful stimuli and drooling.  On arrival to ED, patient was unable to verbally respond to questions.  His baseline repeatedly is independent and walks with a walker and is verbal.  He was recently hospitalized from 05/16/2021 05/24/2023 altered mental status likely due to labile blood pressure and autonomic dysfunction.  His midodrine was discontinued at discharge and he was started on amlodipine and metoprolol.  Patient was discharged to SNF at the recommendation of PT/OT.  ED Course: He was afebrile with a temperature of 99.4 and hypertensive up to 200s over 100s with heart rate between 80s and 90s on room air.  CBC shows leukocytosis of 18.3 and a stable hemoglobin of 11.6.  CMP showed glucose of 143, BUN of 26, GFR of 46 and creatinine of 1.67 from a prior of 1.12 and GFR greater than 60.  Urinalysis shows moderate leukocyte and negative nitrite with rare bacteria and greater than 50 WBC.   CT head shows mild atrophic changes without acute abnormality. CXR unremarkable   Assessment & Plan:   Principal Problem:   Acute metabolic encephalopathy Active Problems:   Parkinson disease ([place])   UTI (urinary tract infection)   AKI (acute kidney injury) ([place])  1 acute metabolic encephalopathy secondary to UTI Patient presented with altered mental status.  Head CT done was negative.  Urinalysis consistent with a UTI.  Will check urine cultures.  Patient improving clinically with fluid resuscitation and IV Rocephin.   Continue IV Rocephin pending urine cultures.  IV fluids.  Supportive care.  Follow.  2.  Hypertension/autonomic dysfunction On admission patient noted to have blood pressure with systolics up in the [id number].  Blood pressure improved on current regimen of amlodipine and metoprolol.  Patient on IV labetalol as needed.  Patient seen by physical therapy patient noted to be orthostatic however asymptomatic likely secondary to patient's autonomic dysfunction.  Will place TED hose.  Follow.  3.  Acute kidney injury Likely secondary to prerenal azotemia.  Renal function improved with hydration.  Follow.  4.  UTI Patient presented with acute metabolic encephalopathy.  Patient noted to have a leukocytosis.  Urinalysis done concerning for UTI.  Check urine cultures.  Continue empiric IV Rocephin.  Follow.  5.  Parkinson's disease Continue Sinemet.   DVT prophylaxis Lovenox Code Status: Full Family Communication: Updated patient and wife Brandon Manning [id number]) at bedside. Disposition Plan: Skilled nursing facility when clinically improved with finalization of urine sensitivities.   Consultants:   None  Procedures:  CT head 05/29/2019  Chest x-ray 05/29/2019  Antimicrobials:  IV Rocephin 05/29/2019   Subjective: Patient sitting up in bed.  More alert.  Patient denies any chest pain.  No shortness of breath.  Objective: Vitals:   05/30/19 0300 05/30/19 0349 05/30/19 0500 05/30/19 0730  BP: (!) 167/87 (!) 147/72  (!) 158/89  Pulse: 86 85  82  Resp: (!) 23   19  Temp:  99.1 F (37.3 C)  98.8 F (37.1 C)  TempSrc:  Oral  Oral  SpO2: 99% 100%  100%  Weight:   85 kg   Height:   6' (1.829 m)     Intake/Output Summary (Last 24 hours) at 05/30/2019 1130 Last data filed at 05/30/2019 1036 Gross per 24 hour  Intake 726.62 ml  Output 0 ml  Net 726.62 ml   Filed Weights   05/29/19 1908 05/30/19 0500  Weight: 85.2 kg 85 kg    Examination:  General exam: Appears calm and  comfortable  Respiratory system: Clear to auscultation. Respiratory effort normal. Cardiovascular system: S1 & S2 heard, RRR. No JVD, murmurs, rubs, gallops or clicks. No pedal edema. Gastrointestinal system: Abdomen is nondistended, soft and nontender. No organomegaly or masses felt. Normal bowel sounds heard. Central nervous system: Alert and oriented. No focal neurological deficits. Extremities: Symmetric 5 x 5 power. Skin: No rashes, lesions or ulcers Psychiatry: Judgement and insight appear normal. Mood & affect appropriate.     Data Reviewed: I have personally reviewed following labs and imaging studies  CBC: Recent Labs  Lab 05/29/19 1916 05/30/19 0645  WBC 18.3* 16.3*  NEUTROABS 14.1*  --   HGB 11.6* 11.0*  HCT 36.0* 33.6*  MCV 84.3 82.8  PLT 355 [id number]   Basic Metabolic Panel: Recent Labs  Lab 05/24/19 0454 05/29/19 1916 05/30/19 0645  NA  --  136 142  K  --  4.1 3.7  CL  --  103 110  CO2  --  24 25  GLUCOSE  --  143* 126*  BUN  --  26* 20  CREATININE 1.12 1.67* 1.16  CALCIUM  --  9.3 8.7*   GFR: Estimated Creatinine Clearance: 60.4 mL/min (by C-G formula based on SCr of 1.16 mg/dL). Liver Function Tests: Recent Labs  Lab 05/29/19 1916  AST 24  ALT 6  ALKPHOS 61  BILITOT 0.9  PROT 7.5  ALBUMIN 3.6   No results for input(s): LIPASE, AMYLASE in the last 168 hours. No results for input(s): AMMONIA in the last 168 hours. Coagulation Profile: No results for input(s): INR, PROTIME in the last 168 hours. Cardiac Enzymes: No results for input(s): CKTOTAL, CKMB, CKMBINDEX, TROPONINI in the last 168 hours. BNP (last 3 results) No results for input(s): PROBNP in the last 8760 hours. HbA1C: No results for input(s): HGBA1C in the last 72 hours. CBG: No results for input(s): GLUCAP in the last 168 hours. Lipid Profile: No results for input(s): CHOL, HDL, LDLCALC, TRIG, CHOLHDL, LDLDIRECT in the last 72 hours. Thyroid Function Tests: No results for input(s):  TSH, T4TOTAL, FREET4, T3FREE, THYROIDAB in the last 72 hours. Anemia Panel: No results for input(s): VITAMINB12, FOLATE, FERRITIN, TIBC, IRON, RETICCTPCT in the last 72 hours. Sepsis Labs: Recent Labs  Lab 05/29/19 2335 05/30/19 0645  LATICACIDVEN 0.9 1.0    Recent Results (from the past 240 hour(s))  SARS CORONAVIRUS 2 (TAT 6-24 HRS) Nasopharyngeal Nasopharyngeal Swab     Status: None   Collection Time: 05/29/19  8:47 PM   Specimen: Nasopharyngeal Swab  Result Value Ref Range Status   SARS Coronavirus 2 NEGATIVE NEGATIVE Final    Comment: (NOTE) SARS-CoV-2 target nucleic acids are NOT DETECTED. The SARS-CoV-2 RNA is generally detectable in upper and lower respiratory specimens during the acute phase of infection. Negative results do not preclude SARS-CoV-2 infection, do not rule out co-infections with other pathogens, and should not be used as the sole basis for treatment or other patient management decisions. Negative results must be combined with clinical observations, patient history, and epidemiological information.  The expected result is Negative. Fact Sheet for Patients: SugarRoll.be Fact Sheet for Healthcare Providers: https://www.woods-mathews.com/ This test is not yet approved or cleared by the [place] FDA and  has been authorized for detection and/or diagnosis of SARS-CoV-2 by FDA under an Emergency Use Authorization (EUA). This EUA will remain  in effect (meaning this test can be used) for the duration of the COVID-19 declaration under Section 56 4(b)(1) of the Act, 21 U.S.C. section 360bbb-3(b)(1), unless the authorization is terminated or revoked sooner. Performed at [facility], [place] [address] [place], [place] [address]   MRSA PCR Screening     Status: None   Collection Time: 05/30/19  6:01 AM   Specimen: Nasopharyngeal  Result Value Ref Range Status   MRSA by PCR NEGATIVE NEGATIVE Final    Comment:         The GeneXpert MRSA Assay (FDA approved for NASAL specimens only), is one component of a comprehensive MRSA colonization surveillance program. It is not intended to diagnose MRSA infection nor to guide or monitor treatment for MRSA infections. Performed at [facility], [address], [place], [place] [address]          Radiology Studies: Ct Head Wo Contrast  Result Date: 05/29/2019 CLINICAL DATA:  Unresponsive EXAM: CT HEAD WITHOUT CONTRAST TECHNIQUE: Contiguous axial images were obtained from the base of the skull through the vertex without intravenous contrast. COMPARISON:  05/13/2019 FINDINGS: Brain: Mild atrophic changes are noted. No findings to suggest acute hemorrhage, acute infarction or space-occupying mass lesion are noted. Vascular: No hyperdense vessel or unexpected calcification. Skull: Normal. Negative for fracture or focal lesion. Sinuses/Orbits: No acute finding. Other: None. IMPRESSION: Mild atrophic changes without acute abnormality. Electronically Signed   By: Inez Catalina M.D.   On: 05/29/2019 19:51   Dg Chest Port 1 View  Result Date: 05/29/2019 CLINICAL DATA:  Altered mental status EXAM: PORTABLE CHEST 1 VIEW COMPARISON:  05/13/2019 chest radiograph. FINDINGS: Stable cardiomediastinal silhouette with normal heart size. No pneumothorax. No pleural effusion. Lungs appear clear, with no acute consolidative airspace disease and no pulmonary edema. IMPRESSION: No active disease. Electronically Signed   By: Ilona Sorrel M.D.   On: 05/29/2019 19:26        Scheduled Meds: . amLODipine  2.5 mg Oral Daily  . [START ON 05/31/2019] aspirin EC  81 mg Oral QODAY  . carbidopa-levodopa  1 tablet Oral Daily  . carbidopa-levodopa  2 tablet Oral BID WC  . enoxaparin (LOVENOX) injection  40 mg Subcutaneous Q24H  . metoprolol tartrate  25 mg Oral BID  . polyethylene glycol  17 g Oral Daily   Continuous Infusions: . sodium chloride 75 mL/hr at 05/30/19 0753   . cefTRIAXone (ROCEPHIN)  IV       LOS: 1 day    Time spent: 40 minutes    Irine Seal, MD [facility]  If 7PM-7AM, please contact night-coverage [email or web address] 05/30/2019, 11:30 AM

## 2019-05-30 NOTE — TOC Initial Note (Signed)
Transition of Care [facility]) - Initial/Assessment Note    Patient Details  Name: Brandon Manning MRN: [id number] Date of Birth: 1946-01-31  Transition of Care [facility]) CM/SW Contact:    [name], LCSW Phone Number: 05/30/2019, 5:42 PM  Clinical Narrative:                  Patient is a 73 year old male who is married and lives with his wife.  Patient is alert and oriented x2.  Patient is a short term rehab at [facility], plan is to return back to SNF to continue with his therapy.  No other major differences to the last assessment and transition notes.  SNF will have to get insurance auth from [place] again.  CSW asked PT and OT to be ordered for new authorization.   Expected Discharge Plan: Skilled Nursing Facility Barriers to Discharge: Continued Medical Work up   Patient Goals and CMS Choice Patient states their goals for this hospitalization and ongoing recovery are:: To return to SNF for short term rehab.   Choice offered to / list presented to : Patient  Expected Discharge Plan and Services Expected Discharge Plan: [place] In-house Referral: Clinical Social Work Discharge Planning Services: NA   Living arrangements for the past 2 months: [place], [place]                 DME Arranged: N/A         HH Arranged: NA          Prior Living Arrangements/Services Living arrangements for the past 2 months: [place], [place] Lives with:: Spouse Patient language and need for interpreter reviewed:: Yes Do you feel safe going back to the place where you live?: No   Patient and his wife feel that he needs to continue with short term rehab in order to return back home.  Need for Family Participation in Patient Care: Yes (Comment) Care giver support system in place?: Yes (comment)   Criminal Activity/Legal Involvement Pertinent to Current Situation/Hospitalization: No - Comment as  needed  Activities of Daily Living Home Assistive Devices/Equipment: Dentures (specify type), Walker (specify type) ADL Screening (condition at time of admission) Patient's cognitive ability adequate to safely complete daily activities?: No Is the patient deaf or have difficulty hearing?: Yes Does the patient have difficulty seeing, even when wearing glasses/contacts?: Yes Does the patient have difficulty concentrating, remembering, or making decisions?: Yes Patient able to express need for assistance with ADLs?: No Does the patient have difficulty dressing or bathing?: Yes Independently performs ADLs?: No Communication: Independent Dressing (OT): Needs assistance Is this a change from baseline?: Pre-admission baseline Grooming: Needs assistance Is this a change from baseline?: Pre-admission baseline Feeding: Needs assistance Is this a change from baseline?: Pre-admission baseline Bathing: Needs assistance Is this a change from baseline?: Pre-admission baseline Toileting: Needs assistance Is this a change from baseline?: Pre-admission baseline In/Out Bed: Needs assistance Is this a change from baseline?: Pre-admission baseline Walks in Home: Needs assistance Is this a change from baseline?: Pre-admission baseline Does the patient have difficulty walking or climbing stairs?: Yes Weakness of Legs: Both Weakness of Arms/Hands: Both  Permission Sought/Granted Permission sought to share information with : Facility Sport and exercise psychologist, Family Supports Permission granted to share information with : Yes, Verbal Permission Granted  Share Information with NAME: Guerino, Marczak [phone]  [phone]  Permission granted to share info w AGENCY: SNF admissions  Emotional Assessment Appearance:: Appears stated age Attitude/Demeanor/Rapport: Engaged Affect (typically observed): Calm, Appropriate, Accepting Orientation: : Oriented to Self, Oriented to Place Alcohol /  Substance Use: Not Applicable Psych Involvement: No (comment)  Admission diagnosis:  Encephalopathy [G93.40] AKI (acute kidney injury) ([place]) [N17.9] Urinary tract infection in elderly patient [N39.0] Patient Active Problem List   Diagnosis Date Noted  . Urinary tract infection in elderly patient   . UTI (urinary tract infection) 05/29/2019  . Acute metabolic encephalopathy [id number]  . AKI (acute kidney injury) ([place]) 05/29/2019  . Autonomic dysfunction 05/17/2019  . Syncope 05/13/2019  . Pedal edema 04/16/2019  . Vitamin D deficiency 02/02/2019  . Vitamin B12 deficiency 05/10/2018  . Urge incontinence 03/16/2018  . Hematuria 03/16/2018  . Orthostatic hypotension 01/26/2017  . Orthostasis 01/25/2017  . Encounter for chronic pain management 01/07/2017  . Memory deficit 07/08/2016  . Chronic constipation 12/19/2015  . Medicare annual wellness visit, initial 06/03/2015  . Advanced care planning/counseling discussion 06/03/2015  . Bilateral low back pain without sciatica 08/01/2014  . Parkinson disease ([place]) 02/01/2014  . 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 BPH (benign prostatic hyperplasia) 11/03/2012  . HTN (hypertension) 09/09/2011  . Health maintenance examination 06/18/2011  . ED (erectile dysfunction) 06/18/2011   PCP:  [name], MD Pharmacy:   [facility] [address]), [place] [address] [place] ([place]) [place] [address] Phone: [phone] Fax: [facility] - [place], [place] [address] Phone: [phone] Fax: [phone]     Social Determinants of Health (SDOH) Interventions    Readmission Risk Interventions No flowsheet data found.

## 2019-05-30 NOTE — Telephone Encounter (Signed)
Noted  

## 2019-05-30 NOTE — Evaluation (Signed)
Physical Therapy Evaluation Patient Details Name: Brandon Manning MRN: [id number] DOB: 03/06/1946 Today's Date: 05/30/2019   History of Present Illness  presented to ER from STR secondary to AMS, decreased responsiveness; admitted for mangement of acute metabolic encephalopathy due to UTI.  Clinical Impression  Upon evaluation, patient alert and oriented to basic information; follows simple commands, but requires redirection to task at times (due to fatigue).  Bilat UE/LE strength and ROM grossly symmetrical and WFL, but generally bradykinetic with decreased coordination and fine motor control evident.  Currently requiring mod assist for bed mobility; mod assist +1-2 for sit/stand, static standing balance and short-distance gait (5') with RW.  Demonstrates short, choppy (near festinating) stepping pattern, improved with cuing for increased step height/length; poor dynamic balance with limited balance reactions. Additional distance limited by orthostatic response to upright.  Will continue to progress as medically appropriate in subsequent sessions. Of note, patient with significant orthostasis (asymptomatic) noted with transition to upright; see vitals flowsheet for details.  BP sitting in recliner end of session 97/63, HR 74.  RN informed/aware of patient response to activity. Would benefit from skilled PT to address above deficits and promote optimal return to PLOF.; recommend transition to STR upon discharge from acute hospitalization.         Follow Up Recommendations SNF    Equipment Recommendations       Recommendations for Other Services       Precautions / Restrictions Precautions Precautions: Fall Precaution Comments: autonomic dysfunction/orthostatic Restrictions Weight Bearing Restrictions: No      Mobility  Bed Mobility Overal bed mobility: Needs Assistance Bed Mobility: Supine to Sit     Supine to sit: Mod assist        Transfers Overall transfer level:  Needs assistance Equipment used: Rolling walker (2 wheeled) Transfers: Sit to/from Stand Sit to Stand: Mod assist;+2 physical assistance         General transfer comment: heavy use of momentum to assist with quick lift off from bed surface  Ambulation/Gait Ambulation/Gait assistance: Mod assist Gait Distance (Feet): 5 Feet Assistive device: Rolling walker (2 wheeled)       General Gait Details: short, choppy (near festinating) stepping pattern, improved wtih cuing for increased step height/length; poor dynamic balance with limited balance reactions. Additional distance limited by orthostatic response to upright.  Stairs            Wheelchair Mobility    Modified Rankin (Stroke Patients Only)       Balance Overall balance assessment: Needs assistance Sitting-balance support: No upper extremity supported;Feet supported Sitting balance-Leahy Scale: Fair Sitting balance - Comments: lists posteriorally with fatigue/divided attention   Standing balance support: Bilateral upper extremity supported Standing balance-Leahy Scale: Poor Standing balance comment: reliant on UE support from staff, requires +2 assist for transfers.                             Pertinent Vitals/Pain Pain Assessment: No/denies pain    Home Living Family/patient expects to be discharged to:: Skilled nursing facility Living Arrangements: Spouse/significant other Available Help at Discharge: Family;Available 24 hours/day Type of Home: House Home Access: Level entry     Home Layout: One level        Prior Function           Comments: Per chart, at baseline, ambulatory for household distances with RW; wife assists with IADLs, driving, medication and household chores     Hand Dominance  Extremity/Trunk Assessment   Upper Extremity Assessment Upper Extremity Assessment: Overall WFL for tasks assessed    Lower Extremity Assessment Lower Extremity Assessment:  Generalized weakness(grossly 4-/5, generally bradykinetic throughout)       Communication   Communication: HOH  Cognition Arousal/Alertness: Awake/alert Behavior During Therapy: WFL for tasks assessed/performed Overall Cognitive Status: History of cognitive impairments - at baseline                                        General Comments      Exercises Other Exercises Other Exercises: Sit/stand x3 with RW, min/mod assist +2 for safety.  Cuign for slower, more controlled movement transitions to maximize safety/indep Other Exercises: Orthostatic assessment-see vitals flowsheet for results.   Assessment/Plan    PT Assessment Patient needs continued PT services  PT Problem List Decreased strength;Decreased mobility;Decreased safety awareness;Decreased activity tolerance;Decreased balance;Decreased knowledge of use of DME;Decreased cognition;Decreased range of motion;Decreased coordination       PT Treatment Interventions DME instruction;Therapeutic exercise;Gait training;Balance training;Stair training;Neuromuscular re-education;Functional mobility training;Therapeutic activities;Patient/family education    PT Goals (Current goals can be found in the Care Plan section)  Acute Rehab PT Goals Patient Stated Goal: to return home when stronger PT Goal Formulation: With patient/family Time For Goal Achievement: 06/13/19 Potential to Achieve Goals: Good    Frequency Min 2X/week   Barriers to discharge Decreased caregiver support      Co-evaluation               AM-PAC PT "6 Clicks" Mobility  Outcome Measure Help needed turning from your back to your side while in a flat bed without using bedrails?: A Lot Help needed moving from lying on your back to sitting on the side of a flat bed without using bedrails?: A Lot Help needed moving to and from a bed to a chair (including a wheelchair)?: A Lot Help needed standing up from a chair using your arms (e.g.,  wheelchair or bedside chair)?: A Lot Help needed to walk in hospital room?: A Lot Help needed climbing 3-5 steps with a railing? : A Lot 6 Click Score: 12    End of Session Equipment Utilized During Treatment: Gait belt Activity Tolerance: Patient tolerated treatment well Patient left: in chair;with call bell/phone within reach;with chair alarm set Nurse Communication: Mobility status PT Visit Diagnosis: Muscle weakness (generalized) (M62.81);Other abnormalities of gait and mobility (R26.89);Difficulty in walking, not elsewhere classified (R26.2)    Time: [id number] PT Time Calculation (min) (ACUTE ONLY): 25 min   Charges:   PT Evaluation $PT Eval High Complexity: 1 High PT Treatments $Therapeutic Activity: 8-22 mins        Sameen Leas H. Owens Shark, PT, DPT, NCS 05/30/19, 2:05 PM [phone]

## 2019-05-30 NOTE — NC FL2 (Signed)
[place] LEVEL OF CARE SCREENING TOOL     IDENTIFICATION  Patient Name: Brandon Manning Birthdate: Oct 21, 1945 Sex: male Admission Date (Current Location): 05/29/2019  [place] and [place] Number:  Engineering geologist and Address:  [facility], [address] [place], [place] [address]      Provider Number: [id number]  Attending Physician Name and Address:  Eugenie Filler, MD  Relative Name and Phone Number:  Mars, Burczyk [phone]  [phone]    Current Level of Care: Hospital Recommended Level of Care: [place] Prior Approval Number:    Date Approved/Denied:   PASRR Number: [id number]  Discharge Plan: SNF    Current Diagnoses: Patient Active Problem List   Diagnosis Date Noted  . Urinary tract infection in elderly patient   . UTI (urinary tract infection) 05/29/2019  . Acute metabolic encephalopathy 99991111  . AKI (acute kidney injury) ([place]) 05/29/2019  . Autonomic dysfunction 05/17/2019  . Syncope 05/13/2019  . Pedal edema 04/16/2019  . Vitamin D deficiency 02/02/2019  . Vitamin B12 deficiency 05/10/2018  . Urge incontinence 03/16/2018  . Hematuria 03/16/2018  . Orthostatic hypotension 01/26/2017  . Orthostasis 01/25/2017  . Encounter for chronic pain management 01/07/2017  . Memory deficit 07/08/2016  . Chronic constipation 12/19/2015  . Medicare annual wellness visit, initial 06/03/2015  . Advanced care planning/counseling discussion 06/03/2015  . Bilateral low back pain without sciatica 08/01/2014  . Parkinson disease ([place]) 02/01/2014  . 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 BPH (benign prostatic hyperplasia) 11/03/2012  . HTN (hypertension) 09/09/2011  . Health maintenance examination 06/18/2011  . ED (erectile dysfunction) 06/18/2011    Orientation RESPIRATION BLADDER Height & Weight     Self, Time  Normal Incontinent Weight: 187 lb 6.3 oz (85 kg) Height:  6' (182.9  cm)  BEHAVIORAL SYMPTOMS/MOOD NEUROLOGICAL BOWEL NUTRITION STATUS      Continent Diet(Cardiac)  AMBULATORY STATUS COMMUNICATION OF NEEDS Skin   Limited Assist Verbally Normal                       Personal Care Assistance Level of Assistance  Bathing, Feeding, Dressing Bathing Assistance: Limited assistance Feeding assistance: Limited assistance Dressing Assistance: Limited assistance Total Care Assistance: Limited assistance   Functional Limitations Info  Sight, Hearing, Speech Sight Info: Adequate Hearing Info: Adequate Speech Info: Adequate    SPECIAL CARE FACTORS FREQUENCY  PT (By licensed PT), OT (By licensed OT)     PT Frequency: Minimum 5x a week OT Frequency: Minimum 5x a week            Contractures Contractures Info: Not present    Additional Factors Info  Code Status, Allergies Code Status Info: Full Code Allergies Info: Penicillins           Current Medications (05/30/2019):  This is the current hospital active medication list Current Facility-Administered Medications  Medication Dose Route Frequency Provider Last Rate Last Dose  . 0.9 %  sodium chloride infusion   Intravenous Continuous Tu, Ching T, DO 75 mL/hr at 05/30/19 0753    . amLODipine (NORVASC) tablet 2.5 mg  2.5 mg Oral Daily Tu, Ching T, DO   2.5 mg at 05/30/19 0844  . [START ON 05/31/2019] aspirin EC tablet 81 mg  81 mg Oral QODAY Tu, Ching T, DO      . carbidopa-levodopa (SINEMET IR) 25-100 MG per tablet immediate release 1 tablet  1 tablet Oral Daily Ena Dawley, RPH  1 tablet at 05/30/19 1610  . carbidopa-levodopa (SINEMET IR) 25-100 MG per tablet immediate release 2 tablet  2 tablet Oral BID WC Hall, Scott A, RPH   2 tablet at 05/30/19 1147  . cefTRIAXone (ROCEPHIN) 1 g in sodium chloride 0.9 % 100 mL IVPB  1 g Intravenous Q24H Tu, Ching T, DO      . enoxaparin (LOVENOX) injection 40 mg  40 mg Subcutaneous Q24H Tu, Ching T, DO   Stopped at 05/30/19 0555  . labetalol (NORMODYNE)  injection 10 mg  10 mg Intravenous Q2H PRN Tu, Ching T, DO   10 mg at 05/30/19 0000  . metoprolol tartrate (LOPRESSOR) tablet 25 mg  25 mg Oral BID Tu, Ching T, DO   25 mg at 05/30/19 0844  . polyethylene glycol (MIRALAX / GLYCOLAX) packet 17 g  17 g Oral Daily Tu, Ching T, DO   17 g at 05/30/19 [id number]     Discharge Medications: Please see discharge summary for a list of discharge medications.  Relevant Imaging Results:  Relevant Lab Results:   Additional Information SSN [id number]  [name], LCSW

## 2019-05-31 LAB — CBC
HCT: 31.2 % — ABNORMAL LOW (ref 39.0–52.0)
Hemoglobin: 9.9 g/dL — ABNORMAL LOW (ref 13.0–17.0)
MCH: 26.9 pg (ref 26.0–34.0)
MCHC: 31.7 g/dL (ref 30.0–36.0)
MCV: 84.8 fL (ref 80.0–100.0)
Platelets: 300 10*3/uL (ref 150–400)
RBC: 3.68 MIL/uL — ABNORMAL LOW (ref 4.22–5.81)
RDW: 14.6 % (ref 11.5–15.5)
WBC: 14.4 10*3/uL — ABNORMAL HIGH (ref 4.0–10.5)
nRBC: 0 % (ref 0.0–0.2)

## 2019-05-31 LAB — BASIC METABOLIC PANEL WITH GFR
Anion gap: 6 (ref 5–15)
BUN: 15 mg/dL (ref 8–23)
CO2: 23 mmol/L (ref 22–32)
Calcium: 8.3 mg/dL — ABNORMAL LOW (ref 8.9–10.3)
Chloride: 109 mmol/L (ref 98–111)
Creatinine, Ser: 1.11 mg/dL (ref 0.61–1.24)
GFR calc Af Amer: >60 mL/min
GFR calc non Af Amer: >60 mL/min
Glucose, Bld: 171 mg/dL — ABNORMAL HIGH (ref 70–99)
Potassium: 3.6 mmol/L (ref 3.5–5.1)
Sodium: 138 mmol/L (ref 135–145)

## 2019-05-31 LAB — URINE CULTURE: Culture: NO GROWTH

## 2019-05-31 MED ORDER — CEFDINIR 300 MG PO CAPS
300.0000 mg | ORAL_CAPSULE | Freq: Two times a day (BID) | ORAL | Status: AC
  Administered 2019-05-31 – 2019-06-02 (×6): 300 mg via ORAL
  Filled 2019-05-31 (×6): qty 1

## 2019-05-31 NOTE — Progress Notes (Signed)
OT Cancellation Note  Patient Details Name: Brandon Manning MRN: [id number] DOB: [birth year]   Cancelled Treatment:     attempted to see pt this pm for OT evaluation, patient lethargic and difficult to arouse for active participation in therapy.  Will continue attempts.  Amy T Lovett, OTR/L, CLT   Lovett,Amy 05/31/2019, 4:36 PM

## 2019-05-31 NOTE — Progress Notes (Signed)
PT Cancellation Note  Patient Details Name: Brandon Manning MRN: [id number] DOB: 12-07-1945   Cancelled Treatment:    Reason Eval/Treat Not Completed: Patient's level of consciousness;Other (comment)   Pt in bed with blankets bundled up on his chest.  Stated he was "swimmy headed".  Pt stated he needs to lay down.  Pt already supine in bed but HOB lowered per his request.  Pt refused session at this time.  Encouraged supine ex but he again refused stating he wanted to see a doctor.  Encouragement provided.  Unable to redirect pt at this time to participate.   [name] 05/31/2019, 12:43 PM

## 2019-05-31 NOTE — Plan of Care (Signed)
  Problem: Urinary Elimination: ?Goal: Signs and symptoms of infection will decrease ?Outcome: Progressing ?  ?Problem: Education: ?Goal: Knowledge of General Education information will improve ?Description: Including pain rating scale, medication(s)/side effects and non-pharmacologic comfort measures ?Outcome: Progressing ?  ?

## 2019-05-31 NOTE — TOC Progression Note (Signed)
Transition of Care [facility]) - Progression Note    Patient Details  Name: KEYONTAE BALUYOT MRN: [id number] Date of Birth: October 16, 1945  Transition of [facility]) CM/SW Contact  [name], [place] Phone Number: 05/31/2019, 12:03 PM  Clinical Narrative:    CSW spoke with patient's wife, and she would like him to return back to [facility] for short term rehab.  CSW spoke to [employer] and she said she will start insurance authorization today through [place].  CSW to continue to follow patient's progress throughout discharge planning.   Expected Discharge Plan: [place] Barriers to Discharge: Continued Medical Work up  Expected Discharge Plan and Services Expected Discharge Plan: [place] In-house Referral: Clinical Social Work Discharge Planning Services: NA   Living arrangements for the past 2 months: [place], [place]                 DME Arranged: N/A         HH Arranged: NA           Social Determinants of Health (SDOH) Interventions    Readmission Risk Interventions No flowsheet data found.

## 2019-05-31 NOTE — Progress Notes (Signed)
PROGRESS NOTE    Brandon Manning  [id number] DOB: Apr 29, 1946 DOA: 05/29/2019 PCP: [name], MD    Brief Narrative:  HPI per Dr.Tu Brandon Manning is a 73 y.o. male with medical history significant of Parkinson's disease, Autonomic dysfunction, hypertension, BPH who presents from outside facility for altered mental status.  Patient is alert and oriented to self and place but unable to provide history. Reportedly he was found by his facility to be responsive only to painful stimuli and drooling.  On arrival to ED, patient was unable to verbally respond to questions.  His baseline repeatedly is independent and walks with a walker and is verbal.  He was recently hospitalized from 05/16/2021 05/24/2023 altered mental status likely due to labile blood pressure and autonomic dysfunction.  His midodrine was discontinued at discharge and he was started on amlodipine and metoprolol.  Patient was discharged to SNF at the recommendation of PT/OT.  ED Course: He was afebrile with a temperature of 99.4 and hypertensive up to 200s over 100s with heart rate between 80s and 90s on room air.  CBC shows leukocytosis of 18.3 and a stable hemoglobin of 11.6.  CMP showed glucose of 143, BUN of 26, GFR of 46 and creatinine of 1.67 from a prior of 1.12 and GFR greater than 60.  Urinalysis shows moderate leukocyte and negative nitrite with rare bacteria and greater than 50 WBC.   CT head shows mild atrophic changes without acute abnormality. CXR unremarkable   Assessment & Plan:   Principal Problem:   Acute metabolic encephalopathy Active Problems:   Parkinson disease ([place])   UTI (urinary tract infection)   AKI (acute kidney injury) ([place])   Urinary tract infection in elderly patient  1 acute metabolic encephalopathy secondary to UTI Patient presented with altered mental status.  Head CT done was negative.  Urinalysis consistent with a UTI.  Urine cultures negative to date however patient was on  IV antibiotics and urine cultures not obtained on admission.  Patient improving clinically.  Continue IV fluids.  Transition from IV Rocephin to oral Omnicef to complete an empiric 5-day course of antibiotic treatment.  Supportive care.   2.  Hypertension/autonomic dysfunction On admission patient noted to have blood pressure with systolics up in the [id number].  Blood pressure improved on current regimen of amlodipine and metoprolol.  Patient on IV labetalol as needed.  Patient seen by physical therapy patient noted to be orthostatic however asymptomatic likely secondary to patient's autonomic dysfunction.  TED hose.  Follow.  3.  Acute kidney injury Likely secondary to prerenal azotemia.  Renal function improved with hydration.  Follow.  4.  UTI Patient presented with acute metabolic encephalopathy.  Patient noted to have a leukocytosis.  Urinalysis done concerning for UTI.  Urine cultures however were not obtained on admission and patient had received IV antibiotics prior to urine culture has been obtained.  Urine cultures negative.  Transition from IV Rocephin to oral Omnicef to complete a 5-day course of empiric antibiotic treatment.  Follow.  5.  Parkinson's disease Continue Sinemet.   DVT prophylaxis Lovenox Code Status: Full Family Communication: Updated patient.  No family at bedside.  Disposition Plan: Skilled nursing facility when clinically improved with finalization of urine sensitivities hopefully tomorrow.   Consultants:   None  Procedures:  CT head 05/29/2019  Chest x-ray 05/29/2019  Antimicrobials:  IV Rocephin 05/29/2019>>>> 05/31/2019  Omnicef 05/31/2019   Subjective: Patient sitting up watching television.  More alert.  Following commands appropriately.  Denies any chest pain or shortness of breath.   Objective: Vitals:   05/30/19 1552 05/30/19 2022 05/31/19 0430 05/31/19 0816  BP: 130/75 (!) 163/75 (!) 145/72 (!) 172/89  Pulse: 78 77 79 68  Resp: 18   18   Temp: 98.8 F (37.1 C) 98.2 F (36.8 C) 98.3 F (36.8 C) 98.2 F (36.8 C)  TempSrc: Oral   Oral  SpO2: 100% 100% 100% 100%  Weight:   81.8 kg   Height:        Intake/Output Summary (Last 24 hours) at 05/31/2019 1143 Last data filed at 05/31/2019 0900 Gross per 24 hour  Intake 120 ml  Output 1275 ml  Net -1155 ml   Filed Weights   05/29/19 1908 05/30/19 0500 05/31/19 0430  Weight: 85.2 kg 85 kg 81.8 kg    Examination:  General exam: NAD Respiratory system: Lungs clear to auscultation bilaterally.  No wheezes, no crackles, no rhonchi.  Respiratory effort normal. Cardiovascular system: Regular rate rhythm no murmurs rubs or gallops.  No JVD.  No lower extremity edema. Gastrointestinal system: Abdomen is soft, nontender, nondistended, positive bowel sounds.  No rebound.  No guarding.  Central nervous system: Alert and oriented. No focal neurological deficits. Extremities: Symmetric 5 x 5 power. Skin: No rashes, lesions or ulcers Psychiatry: Judgement and insight appear fair. Mood & affect appropriate.     Data Reviewed: I have personally reviewed following labs and imaging studies  CBC: Recent Labs  Lab 05/29/19 1916 05/30/19 0645 05/31/19 0449  WBC 18.3* 16.3* 14.4*  NEUTROABS 14.1*  --   --   HGB 11.6* 11.0* 9.9*  HCT 36.0* 33.6* 31.2*  MCV 84.3 82.8 84.8  PLT 355 322 [id number]   Basic Metabolic Panel: Recent Labs  Lab 05/29/19 1916 05/30/19 0645 05/31/19 0449  NA 136 142 138  K 4.1 3.7 3.6  CL 103 110 109  CO2 24 25 23   GLUCOSE 143* 126* 171*  BUN 26* 20 15  CREATININE 1.67* 1.16 1.11  CALCIUM 9.3 8.7* 8.3*   GFR: Estimated Creatinine Clearance: 63.1 mL/min (by C-G formula based on SCr of 1.11 mg/dL). Liver Function Tests: Recent Labs  Lab 05/29/19 1916  AST 24  ALT 6  ALKPHOS 61  BILITOT 0.9  PROT 7.5  ALBUMIN 3.6   No results for input(s): LIPASE, AMYLASE in the last 168 hours. No results for input(s): AMMONIA in the last 168 hours.  Coagulation Profile: No results for input(s): INR, PROTIME in the last 168 hours. Cardiac Enzymes: No results for input(s): CKTOTAL, CKMB, CKMBINDEX, TROPONINI in the last 168 hours. BNP (last 3 results) No results for input(s): PROBNP in the last 8760 hours. HbA1C: No results for input(s): HGBA1C in the last 72 hours. CBG: No results for input(s): GLUCAP in the last 168 hours. Lipid Profile: No results for input(s): CHOL, HDL, LDLCALC, TRIG, CHOLHDL, LDLDIRECT in the last 72 hours. Thyroid Function Tests: No results for input(s): TSH, T4TOTAL, FREET4, T3FREE, THYROIDAB in the last 72 hours. Anemia Panel: No results for input(s): VITAMINB12, FOLATE, FERRITIN, TIBC, IRON, RETICCTPCT in the last 72 hours. Sepsis Labs: Recent Labs  Lab 05/29/19 2335 05/30/19 0645  LATICACIDVEN 0.9 1.0    Recent Results (from the past 240 hour(s))  SARS CORONAVIRUS 2 (TAT 6-24 HRS) Nasopharyngeal Nasopharyngeal Swab     Status: None   Collection Time: 05/29/19  8:47 PM   Specimen: Nasopharyngeal Swab  Result Value Ref Range Status   SARS Coronavirus 2 NEGATIVE NEGATIVE Final  Comment: (NOTE) SARS-CoV-2 target nucleic acids are NOT DETECTED. The SARS-CoV-2 RNA is generally detectable in upper and lower respiratory specimens during the acute phase of infection. Negative results do not preclude SARS-CoV-2 infection, do not rule out co-infections with other pathogens, and should not be used as the sole basis for treatment or other patient management decisions. Negative results must be combined with clinical observations, patient history, and epidemiological information. The expected result is Negative. Fact Sheet for Patients: SugarRoll.be Fact Sheet for Healthcare Providers: https://www.woods-mathews.com/ This test is not yet approved or cleared by the [place] FDA and  has been authorized for detection and/or diagnosis of SARS-CoV-2 by FDA under  an Emergency Use Authorization (EUA). This EUA will remain  in effect (meaning this test can be used) for the duration of the COVID-19 declaration under Section 56 4(b)(1) of the Act, 21 U.S.C. section 360bbb-3(b)(1), unless the authorization is terminated or revoked sooner. Performed at [facility], [place] [address] [place], [place] [address]   MRSA PCR Screening     Status: None   Collection Time: 05/30/19  6:01 AM   Specimen: Nasopharyngeal  Result Value Ref Range Status   MRSA by PCR NEGATIVE NEGATIVE Final    Comment:        The GeneXpert MRSA Assay (FDA approved for NASAL specimens only), is one component of a comprehensive MRSA colonization surveillance program. It is not intended to diagnose MRSA infection nor to guide or monitor treatment for MRSA infections. Performed at [facility], [address] [place], [place] [address]   Urine culture     Status: None   Collection Time: 05/30/19 10:33 AM   Specimen: Urine, Catheterized  Result Value Ref Range Status   Specimen Description   Final    URINE, CATHETERIZED Performed at [facility], [address], [place], [place] [address]    Special Requests   Final    NONE Performed at [facility], [address], [place], [place] [address]    Culture   Final    NO GROWTH Performed at [facility], [place] [address] [place], [place] [address]    Report Status 05/31/2019 FINAL  Final         Radiology Studies: Ct Head Wo Contrast  Result Date: 05/29/2019 CLINICAL DATA:  Unresponsive EXAM: CT HEAD WITHOUT CONTRAST TECHNIQUE: Contiguous axial images were obtained from the base of the skull through the vertex without intravenous contrast. COMPARISON:  05/13/2019 FINDINGS: Brain: Mild atrophic changes are noted. No findings to suggest acute hemorrhage, acute infarction or space-occupying mass lesion are noted. Vascular: No hyperdense vessel or unexpected calcification. Skull:  Normal. Negative for fracture or focal lesion. Sinuses/Orbits: No acute finding. Other: None. IMPRESSION: Mild atrophic changes without acute abnormality. Electronically Signed   By: Inez Catalina M.D.   On: 05/29/2019 19:51   Dg Chest Port 1 View  Result Date: 05/29/2019 CLINICAL DATA:  Altered mental status EXAM: PORTABLE CHEST 1 VIEW COMPARISON:  05/13/2019 chest radiograph. FINDINGS: Stable cardiomediastinal silhouette with normal heart size. No pneumothorax. No pleural effusion. Lungs appear clear, with no acute consolidative airspace disease and no pulmonary edema. IMPRESSION: No active disease. Electronically Signed   By: Ilona Sorrel M.D.   On: 05/29/2019 19:26        Scheduled Meds: . amLODipine  2.5 mg Oral Daily  . aspirin EC  81 mg Oral QODAY  . carbidopa-levodopa  1 tablet Oral Daily  . carbidopa-levodopa  2  tablet Oral BID WC  . enoxaparin (LOVENOX) injection  40 mg Subcutaneous Q24H  . metoprolol tartrate  25 mg Oral BID  . polyethylene glycol  17 g Oral Daily   Continuous Infusions: . sodium chloride 75 mL/hr at 05/30/19 0753  . cefTRIAXone (ROCEPHIN)  IV 1 g (05/30/19 2000)     LOS: 2 days    Time spent: 35 minutes    Irine Seal, MD [facility]  If 7PM-7AM, please contact night-coverage [email or web address] 05/31/2019, 11:43 AM

## 2019-06-01 ENCOUNTER — Other Ambulatory Visit: Payer: Self-pay

## 2019-06-01 LAB — BASIC METABOLIC PANEL
Anion gap: 8 (ref 5–15)
BUN: 14 mg/dL (ref 8–23)
CO2: 24 mmol/L (ref 22–32)
Calcium: 8.7 mg/dL — ABNORMAL LOW (ref 8.9–10.3)
Chloride: 108 mmol/L (ref 98–111)
Creatinine, Ser: 0.97 mg/dL (ref 0.61–1.24)
GFR calc Af Amer: >60 mL/min (ref >60)
GFR calc non Af Amer: >60 mL/min (ref >60)
Glucose, Bld: 153 mg/dL — ABNORMAL HIGH (ref 70–99)
Potassium: 3.8 mmol/L (ref 3.5–5.1)
Sodium: 140 mmol/L (ref 135–145)

## 2019-06-01 LAB — CBC
HCT: 32.2 % — ABNORMAL LOW (ref 39.0–52.0)
Hemoglobin: 10.5 g/dL — ABNORMAL LOW (ref 13.0–17.0)
MCH: 27 pg (ref 26.0–34.0)
MCHC: 32.6 g/dL (ref 30.0–36.0)
MCV: 82.8 fL (ref 80.0–100.0)
Platelets: 337 10*3/uL (ref 150–400)
RBC: 3.89 MIL/uL — ABNORMAL LOW (ref 4.22–5.81)
RDW: 14.5 % (ref 11.5–15.5)
WBC: 11.3 10*3/uL — ABNORMAL HIGH (ref 4.0–10.5)
nRBC: 0 % (ref 0.0–0.2)

## 2019-06-01 MED ORDER — CEFDINIR 300 MG PO CAPS: 300.0000 mg | ORAL_CAPSULE | Freq: Two times a day (BID) | ORAL | 0 refills | Status: DC

## 2019-06-01 MED ORDER — DIAZEPAM 2 MG PO TABS
2.0000 mg | ORAL_TABLET | Freq: Once | ORAL | Status: AC
  Administered 2019-06-01: 2 mg via ORAL
  Filled 2019-06-01: qty 1

## 2019-06-01 MED ORDER — METOPROLOL TARTRATE 50 MG PO TABS
50.0000 mg | ORAL_TABLET | Freq: Two times a day (BID) | ORAL | Status: DC
  Administered 2019-06-02 – 2019-06-04 (×5): 50 mg via ORAL
  Filled 2019-06-01 (×5): qty 1

## 2019-06-01 NOTE — Discharge Summary (Signed)
Physician Discharge Summary  Brandon Manning [id number] DOB: 04/26/1946 DOA: 05/29/2019  PCP: [name], MD  Admit date: 05/29/2019 Discharge date: 06/01/2019  Time spent: 50 minutes  Recommendations for Outpatient Follow-up:  1. Follow-up with MD at skilled nursing facility.  Patient will need a basic metabolic profile done in 1 week to follow-up on electrolytes and renal function.   Discharge Diagnoses:  Principal Problem:   Acute metabolic encephalopathy Active Problems:   Parkinson disease ([place])   Autonomic dysfunction   UTI (urinary tract infection)   AKI (acute kidney injury) ([place])   Urinary tract infection in elderly patient   Discharge Condition: Stable and improved  Diet recommendation: Heart healthy  Filed Weights   05/30/19 0500 05/31/19 0430 06/01/19 0326  Weight: 85 kg 81.8 kg 82.7 kg    History of present illness:  Per Dr.[name] is a 73 y.o. male with medical history significant of Parkinson's disease, Autonomic dysfunction, hypertension, BPH who presented from outside facility for altered mental status.  Patient was alert and oriented to self and place but unable to provide history. Reportedly he was found by his facility to be responsive only to painful stimuli and drooling.  On arrival to ED, patient was unable to verbally respond to questions.  His baseline repeatedly is independent and walks with a walker and is verbal.  He was recently hospitalized from 05/16/2021 05/24/2023 altered mental status likely due to labile blood pressure and autonomic dysfunction.  His midodrine was discontinued at discharge and he was started on amlodipine and metoprolol.  Patient was discharged to SNF at the recommendation of PT/OT.  ED Course: He was afebrile with a temperature of 99.4 and hypertensive up to 200s over 100s with heart rate between 80s and 90s on room air.  CBC shows leukocytosis of 18.3 and a stable hemoglobin of 11.6.  CMP showed  glucose of 143, BUN of 26, GFR of 46 and creatinine of 1.67 from a prior of 1.12 and GFR greater than 60.  Urinalysis shows moderate leukocyte and negative nitrite with rare bacteria and greater than 50 WBC.   CT head shows mild atrophic changes without acute abnormality. CXR unremarkable.   Hospital Course:  1 acute metabolic encephalopathy secondary to UTI Patient presented with altered mental status.  Head CT done was negative.  Urinalysis consistent with a UTI.  Urine cultures negative to date however patient was on IV antibiotics and urine cultures not obtained on admission.  Patient improved clinically on IV antibiotics as well as IV fluids.  Patient remained afebrile.  Leukocytosis trended down.  IV Rocephin was subsequently transitioned to oral Omnicef and patient was discharged on 2 more days of oral Omnicef to complete a 5-day course of antibiotic treatment.  Outpatient follow-up.   2.  Hypertension/autonomic dysfunction On admission patient noted to have blood pressure with systolics up in the [id number].  Blood pressure improved on current regimen of amlodipine and metoprolol.  Patient on IV labetalol as needed.  Patient seen by physical therapy patient noted to be orthostatic however asymptomatic likely secondary to patient's autonomic dysfunction.  Patient maintained on TED hose.  Patient maintained on home regimen of antihypertensive medication which would not be adjusted.  Outpatient follow-up.  3.  Acute kidney injury Likely secondary to prerenal azotemia.  Renal function improved and had resolved with hydration.   4.  UTI Patient presented with acute metabolic encephalopathy.  Patient noted to have a leukocytosis.  Urinalysis done concerning for UTI.  Urine cultures however were not obtained on admission and patient had received IV antibiotics prior to urine culture has been obtained.  Urine cultures negative.  Patient was maintained on IV Rocephin and subsequently transition to oral  Omnicef.  Patient be discharged on 2 more days of oral Omnicef to complete a 5-day course of antibiotic treatment.  Patient had improved clinically by day of discharge.   5.  Parkinson's disease Patient was maintained on Sinemet.  Procedures:  CT head 05/29/2019  Chest x-ray 05/29/2019   Consultations:  None  Discharge Exam: Vitals:   06/01/19 0753 06/01/19 1001  BP: (!) 196/104 (!) 172/87  Pulse: 78   Resp:    Temp: 98 F (36.7 C)   SpO2: 100%     General: NAD Cardiovascular: RRR Respiratory: CTAB  Discharge Instructions   Discharge Instructions    Diet - low sodium heart healthy   Complete by: As directed    Increase activity slowly   Complete by: As directed      Allergies as of 06/01/2019      Reactions   Penicillins Rash   Has patient had a PCN reaction causing immediate rash, facial/tongue/throat swelling, SOB or lightheadedness with hypotension: No Has patient had a PCN reaction causing severe rash involving mucus membranes or skin necrosis: No Has patient had a PCN reaction that required hospitalization No Has patient had a PCN reaction occurring within the last 10 years: No If all of the above answers are "NO", then may proceed with Cephalosporin use.      Medication List    TAKE these medications   amLODipine 2.5 MG tablet Commonly known as: NORVASC Take 1 tablet (2.5 mg total) by mouth daily.   aspirin EC 81 MG tablet Take 81 mg by mouth every other day.   carbidopa-levodopa 25-100 MG tablet Commonly known as: SINEMET IR 2 in the 7am, 2 at noon, 1 at 4pm   cefdinir 300 MG capsule Commonly known as: OMNICEF Take 1 capsule (300 mg total) by mouth every 12 (twelve) hours for 2 days.   metoprolol tartrate 25 MG tablet Commonly known as: LOPRESSOR Take 1 tablet (25 mg total) by mouth 2 (two) times daily.   MULTIVITAMIN PO Take 1 tablet by mouth daily.   polyethylene glycol 17 g packet Commonly known as: MIRALAX / GLYCOLAX Take 17 g  by mouth daily.   Vitamin D 50 MCG (2000 UT) Caps Take 1 capsule (2,000 Units total) by mouth daily.      Allergies  Allergen Reactions  . Penicillins Rash    Has patient had a PCN reaction causing immediate rash, facial/tongue/throat swelling, SOB or lightheadedness with hypotension: No Has patient had a PCN reaction causing severe rash involving mucus membranes or skin necrosis: No Has patient had a PCN reaction that required hospitalization No Has patient had a PCN reaction occurring within the last 10 years: No If all of the above answers are "NO", then may proceed with Cephalosporin use.    Contact information for follow-up providers    MD AT SNF Follow up.            Contact information for after-discharge care    [facility] .   Service: Skilled Nursing Contact information: [place] [phone]                   The results of significant diagnostics from this hospitalization (including imaging,  microbiology, ancillary and laboratory) are listed below for reference.    Significant Diagnostic Studies: Ct Head Wo Contrast  Result Date: 05/29/2019 CLINICAL DATA:  Unresponsive EXAM: CT HEAD WITHOUT CONTRAST TECHNIQUE: Contiguous axial images were obtained from the base of the skull through the vertex without intravenous contrast. COMPARISON:  05/13/2019 FINDINGS: Brain: Mild atrophic changes are noted. No findings to suggest acute hemorrhage, acute infarction or space-occupying mass lesion are noted. Vascular: No hyperdense vessel or unexpected calcification. Skull: Normal. Negative for fracture or focal lesion. Sinuses/Orbits: No acute finding. Other: None. IMPRESSION: Mild atrophic changes without acute abnormality. Electronically Signed   By: Inez Catalina M.D.   On: 05/29/2019 19:51   Ct Head Wo Contrast  Result Date: 05/13/2019 CLINICAL DATA:  Syncope, weakness, dizziness  EXAM: CT HEAD WITHOUT CONTRAST TECHNIQUE: Contiguous axial images were obtained from the base of the skull through the vertex without intravenous contrast. COMPARISON:  01/25/2017, 08/05/2009 FINDINGS: Brain: No evidence of acute infarction, hemorrhage, extra-axial collection, ventriculomegaly, or mass effect. Generalized cerebral atrophy. Periventricular white matter low attenuation likely secondary to microangiopathy. Vascular: Cerebrovascular atherosclerotic calcifications are noted. Skull: Negative for fracture or focal lesion. Sinuses/Orbits: Visualized portions of the orbits are unremarkable. Visualized portions of the paranasal sinuses and mastoid air cells are unremarkable. Other: Partially visualized sebaceous cyst in the posterior scalp. IMPRESSION: No acute intracranial pathology. Electronically Signed   By: Kathreen Devoid   On: 05/13/2019 17:43   Dg Chest Port 1 View  Result Date: 05/29/2019 CLINICAL DATA:  Altered mental status EXAM: PORTABLE CHEST 1 VIEW COMPARISON:  05/13/2019 chest radiograph. FINDINGS: Stable cardiomediastinal silhouette with normal heart size. No pneumothorax. No pleural effusion. Lungs appear clear, with no acute consolidative airspace disease and no pulmonary edema. IMPRESSION: No active disease. Electronically Signed   By: Ilona Sorrel M.D.   On: 05/29/2019 19:26   Dg Chest Portable 1 View  Result Date: 05/13/2019 CLINICAL DATA:  Syncope, weakness, dizziness EXAM: PORTABLE CHEST 1 VIEW COMPARISON:  01/25/2017 FINDINGS: The heart size and mediastinal contours are within normal limits. Both lungs are clear. The visualized skeletal structures are unremarkable. IMPRESSION: No active disease. Electronically Signed   By: Kathreen Devoid   On: 05/13/2019 17:40    Microbiology: Recent Results (from the past 240 hour(s))  SARS CORONAVIRUS 2 (TAT 6-24 HRS) Nasopharyngeal Nasopharyngeal Swab     Status: None   Collection Time: 05/29/19  8:47 PM   Specimen: Nasopharyngeal Swab   Result Value Ref Range Status   SARS Coronavirus 2 NEGATIVE NEGATIVE Final    Comment: (NOTE) SARS-CoV-2 target nucleic acids are NOT DETECTED. The SARS-CoV-2 RNA is generally detectable in upper and lower respiratory specimens during the acute phase of infection. Negative results do not preclude SARS-CoV-2 infection, do not rule out co-infections with other pathogens, and should not be used as the sole basis for treatment or other patient management decisions. Negative results must be combined with clinical observations, patient history, and epidemiological information. The expected result is Negative. Fact Sheet for Patients: SugarRoll.be Fact Sheet for Healthcare Providers: https://www.woods-mathews.com/ This test is not yet approved or cleared by the [place] FDA and  has been authorized for detection and/or diagnosis of SARS-CoV-2 by FDA under an Emergency Use Authorization (EUA). This EUA will remain  in effect (meaning this test can be used) for the duration of the COVID-19 declaration under Section 56 4(b)(1) of the Act, 21 U.S.C. section 360bbb-3(b)(1), unless the authorization is terminated or revoked sooner. Performed at [facility], [place] [address] [place], [place] [address]   MRSA PCR Screening     Status: None   Collection Time: 05/30/19  6:01 AM   Specimen: Nasopharyngeal  Result Value Ref Range Status   MRSA by PCR NEGATIVE NEGATIVE Final    Comment:        The GeneXpert MRSA Assay (FDA approved for NASAL specimens only), is one component of a comprehensive MRSA colonization surveillance program. It is not intended to diagnose MRSA infection nor to guide or monitor treatment for MRSA infections. Performed at [facility], [address], [place], [place] [address]   Urine culture     Status: None   Collection Time: 05/30/19 10:33 AM   Specimen: Urine, Catheterized  Result Value Ref Range  Status   Specimen Description   Final    URINE, CATHETERIZED Performed at [facility], [address], [place], [place] [address]    Special Requests   Final    NONE Performed at [facility], [address] [place], [place] [address]    Culture   Final    NO GROWTH Performed at [facility], [place] [address] [place], [place] [address]    Report Status 05/31/2019 FINAL  Final     Labs: Basic Metabolic Panel: Recent Labs  Lab 05/29/19 1916 05/30/19 0645 05/31/19 0449 06/01/19 0547  NA 136 142 138 140  K 4.1 3.7 3.6 3.8  CL 103 110 109 108  CO2 24 25 23 24   GLUCOSE 143* 126* 171* 153*  BUN 26* 20 15 14   CREATININE 1.67* 1.16 1.11 0.97  CALCIUM 9.3 8.7* 8.3* 8.7*   Liver Function Tests: Recent Labs  Lab 05/29/19 1916  AST 24  ALT 6  ALKPHOS 61  BILITOT 0.9  PROT 7.5  ALBUMIN 3.6   No results for input(s): LIPASE, AMYLASE in the last 168 hours. No results for input(s): AMMONIA in the last 168 hours. CBC: Recent Labs  Lab 05/29/19 1916 05/30/19 0645 05/31/19 0449 06/01/19 0547  WBC 18.3* 16.3* 14.4* 11.3*  NEUTROABS 14.1*  --   --   --   HGB 11.6* 11.0* 9.9* 10.5*  HCT 36.0* 33.6* 31.2* 32.2*  MCV 84.3 82.8 84.8 82.8  PLT 355 322 300 337   Cardiac Enzymes: No results for input(s): CKTOTAL, CKMB, CKMBINDEX, TROPONINI in the last 168 hours. BNP: BNP (last 3 results) No results for input(s): BNP in the last 8760 hours.  ProBNP (last 3 results) No results for input(s): PROBNP in the last 8760 hours.  CBG: No results for input(s): GLUCAP in the last 168 hours.     Signed:  Irine Seal MD.  [facility] 06/01/2019, 2:56 PM

## 2019-06-01 NOTE — Care Management Important Message (Signed)
Important Message  Patient Details  Name: Brandon Manning MRN: [id number] Date of Birth: 05/02/1946   Medicare Important Message Given:  Yes     Dannette Barbara 06/01/2019, 5:17 PM

## 2019-06-01 NOTE — Progress Notes (Signed)
OT Cancellation Note  Patient Details Name: Brandon Manning MRN: [id number] DOB: 1946-06-08   Cancelled Treatment:    Reason Eval/Treat Not Completed: Medical issues which prohibited therapy. Consult received, chart reviewed. Pt with significantly elevated BP continuing this am (most recent 196/104). Inappropriate for OT evaluation at this time. Will re-attempt at later date/time as pt is medically appropriate.   Jeni Salles, MPH, MS, OTR/L ascom [phone] 06/01/19, 8:55 AM

## 2019-06-01 NOTE — Progress Notes (Signed)
PT Cancellation Note  Patient Details Name: Brandon Manning MRN: [id number] DOB: 14-Apr-1946   Cancelled Treatment:    Reason Eval/Treat Not Completed: Medical issues which prohibited therapy  Patient with extremely elevated BP of 196/104. Will re-attempt PT when appropriate.    Kea Callan 06/01/2019, 9:18 AM

## 2019-06-01 NOTE — Progress Notes (Signed)
Physical Therapy Treatment Patient Details Name: Brandon Manning MRN: [id number] DOB: 1946/06/30 Today's Date: 06/01/2019    History of Present Illness presented to ER from STR secondary to AMS, decreased responsiveness; admitted for mangement of acute metabolic encephalopathy due to UTI.    PT Comments    Patient received in bed, legs off end of bed. Patient wants to walk and see what he can do. Requires min guard /supervision for supine to sit. Use of rails. Min assist with sit to stand. Patient able to ambulate 25 feet with RW and min guard. Performed LE strengthening exercises as well. He will continue to benefit from skilled PT to improve strength and functional independence with mobility.      Follow Up Recommendations  SNF     Equipment Recommendations  None recommended by PT    Recommendations for Other Services       Precautions / Restrictions Precautions Precautions: Fall Restrictions Weight Bearing Restrictions: No    Mobility  Bed Mobility Overal bed mobility: Needs Assistance Bed Mobility: Supine to Sit     Supine to sit: HOB elevated;Supervision     General bed mobility comments: increased time and assist but once upright he is able to scoot hips to edge of bed fairly well  Transfers Overall transfer level: Needs assistance Equipment used: Rolling walker (2 wheeled) Transfers: Sit to/from Stand   Stand pivot transfers: From elevated surface;Min guard          Ambulation/Gait Ambulation/Gait assistance: Min guard Gait Distance (Feet): 25 Feet Assistive device: Rolling walker (2 wheeled) Gait Pattern/deviations: Decreased step length - right;Decreased step length - left;Shuffle;Step-to pattern;Trunk flexed Gait velocity: decreased   General Gait Details: decreased foot clearance bilaterally, decreased step length requiring cues to improve.   Stairs             Wheelchair Mobility    Modified Rankin (Stroke Patients Only)        Balance Overall balance assessment: Needs assistance Sitting-balance support: Feet supported;Single extremity supported Sitting balance-Leahy Scale: Fair     Standing balance support: Bilateral upper extremity supported;During functional activity Standing balance-Leahy Scale: Fair Standing balance comment: reliant on UE support from RW.                            Cognition   Behavior During Therapy: WFL for tasks assessed/performed Overall Cognitive Status: History of cognitive impairments - at baseline                                        Exercises Other Exercises Other Exercises: long sitting LE strengthening: ap, heel slides, hip abd/add x 10 reps each    General Comments        Pertinent Vitals/Pain Pain Assessment: No/denies pain    Home Living                      Prior Function            PT Goals (current goals can now be found in the care plan section) Acute Rehab PT Goals Patient Stated Goal: to return home when stronger PT Goal Formulation: With patient/family Time For Goal Achievement: 06/13/19 Potential to Achieve Goals: Good Progress towards PT goals: Progressing toward goals    Frequency    Min 2X/week      PT Plan Current  plan remains appropriate    Co-evaluation              AM-PAC PT "6 Clicks" Mobility   Outcome Measure  Help needed turning from your back to your side while in a flat bed without using bedrails?: A Little Help needed moving from lying on your back to sitting on the side of a flat bed without using bedrails?: A Little Help needed moving to and from a bed to a chair (including a wheelchair)?: A Lot Help needed standing up from a chair using your arms (e.g., wheelchair or bedside chair)?: A Little Help needed to walk in hospital room?: A Little Help needed climbing 3-5 steps with a railing? : A Lot 6 Click Score: 16    End of Session Equipment Utilized During Treatment: Gait  belt Activity Tolerance: Patient tolerated treatment well Patient left: in chair;with chair alarm set;with call bell/phone within reach Nurse Communication: Mobility status PT Visit Diagnosis: Unsteadiness on feet (R26.81);Muscle weakness (generalized) (M62.81);Difficulty in walking, not elsewhere classified (R26.2);Other abnormalities of gait and mobility (R26.89)     Time: [id number] PT Time Calculation (min) (ACUTE ONLY): 20 min  Charges:  $Gait Training: 8-22 mins                     Chazlyn Cude, PT, GCS 06/01/19,12:16 PM

## 2019-06-01 NOTE — TOC Progression Note (Signed)
Transition of Care [facility]) - Progression Note    Patient Details  Name: Brandon Manning MRN: [id number] Date of Birth: [birth year]  Transition of Care [facility]) CM/SW Contact  [name], [place] Phone Number: 06/01/2019, 2:43 PM  Clinical Narrative:     CSW spoke with [employer], and they are still waiting for insurance authorization.  CSW updated patient's wife that insurance was still pending.  CSW was informed that if patient does not have discharge summary by 3pm they will not be able to accept patient.  CSW asked physician to complete dc summary in case Josem Kaufmann does come in later today.   Expected Discharge Plan: [place] Barriers to Discharge: Continued Medical Work up  Expected Discharge Plan and Services Expected Discharge Plan: [place] In-house Referral: Clinical Social Work Discharge Planning Services: NA   Living arrangements for the past 2 months: [place], [place]                 DME Arranged: N/A         HH Arranged: NA           Social Determinants of Health (SDOH) Interventions    Readmission Risk Interventions No flowsheet data found.

## 2019-06-01 NOTE — Progress Notes (Signed)
Notified NP of pt with elevated bp's throughout the evening,and prn labetalol given. Pt scheduled with 0700 medication and later blood pressure medication. No new orders at this time. Will continue to monitor.

## 2019-06-01 NOTE — TOC Progression Note (Signed)
Transition of Care [facility]) - Progression Note    Patient Details  Name: Brandon Manning MRN: [id number] Date of Birth: 09-Apr-1946  Transition [facility] Care [facility]) CM/SW Contact  [name], [place] Phone Number: 06/01/2019, 4:52 PM  Clinical Narrative:    CSW received phone call from [employer] that they have received insurance authorization, however they are not able to accept patient over the weekend.  CSW was informed they can accept patient on Monday morning, CSW updated patient's wife.   Expected Discharge Plan: [place] Barriers to Discharge: Continued Medical Work up  Expected Discharge Plan and Services Expected Discharge Plan: [place] In-house Referral: Clinical Social Work Discharge Planning Services: NA   Living arrangements for the past 2 months: [place], [place] Expected Discharge Date: 06/01/19               DME Arranged: N/A         HH Arranged: NA           Social Determinants of Health (SDOH) Interventions    Readmission Risk Interventions No flowsheet data found.

## 2019-06-02 LAB — CBC
HCT: 33.9 % — ABNORMAL LOW (ref 39.0–52.0)
Hemoglobin: 11 g/dL — ABNORMAL LOW (ref 13.0–17.0)
MCH: 27.2 pg (ref 26.0–34.0)
MCHC: 32.4 g/dL (ref 30.0–36.0)
MCV: 83.7 fL (ref 80.0–100.0)
Platelets: 382 10*3/uL (ref 150–400)
RBC: 4.05 MIL/uL — ABNORMAL LOW (ref 4.22–5.81)
RDW: 14.3 % (ref 11.5–15.5)
WBC: 10 10*3/uL (ref 4.0–10.5)
nRBC: 0 % (ref 0.0–0.2)

## 2019-06-02 LAB — BASIC METABOLIC PANEL
Anion gap: 7 (ref 5–15)
BUN: 16 mg/dL (ref 8–23)
CO2: 25 mmol/L (ref 22–32)
Calcium: 9 mg/dL (ref 8.9–10.3)
Chloride: 107 mmol/L (ref 98–111)
Creatinine, Ser: 1.1 mg/dL (ref 0.61–1.24)
GFR calc Af Amer: >60 mL/min (ref >60)
GFR calc non Af Amer: >60 mL/min (ref >60)
Glucose, Bld: 139 mg/dL — ABNORMAL HIGH (ref 70–99)
Potassium: 3.7 mmol/L (ref 3.5–5.1)
Sodium: 139 mmol/L (ref 135–145)

## 2019-06-02 MED ORDER — AMLODIPINE BESYLATE 5 MG PO TABS
2.5000 mg | ORAL_TABLET | Freq: Every day | ORAL | Status: DC
  Administered 2019-06-02 – 2019-06-04 (×3): 2.5 mg via ORAL
  Filled 2019-06-02 (×3): qty 1

## 2019-06-02 MED ORDER — AMLODIPINE BESYLATE 5 MG PO TABS: 5.0000 mg | ORAL_TABLET | Freq: Every day | ORAL | Status: DC

## 2019-06-02 NOTE — Plan of Care (Signed)
  Problem: Activity: Goal: Risk for activity intolerance will decrease Outcome: Progressing   

## 2019-06-02 NOTE — Progress Notes (Signed)
OT Cancellation Note  Patient Details Name: Brandon Manning MRN: [id number] DOB: [birth year]   Cancelled Treatment:    Reason Eval/Treat Not Completed: Fatigue/lethargy limiting ability to participate. Pt sleeping soundly, does not easily wake with gentle verbal and tactile cues. Will re-attempt at later date/time as pt is able to participate.   Jeni Salles, MPH, MS, OTR/L ascom [phone] 06/02/19, 9:15 AM

## 2019-06-02 NOTE — Progress Notes (Signed)
PROGRESS NOTE    Brandon Manning  [id number] DOB: Jul 21, 1945 DOA: 05/29/2019 PCP: [name], MD    Brief Narrative:  HPI per Dr.Tu JARRETT AMESBURY is a 73 y.o. male with medical history significant of Parkinson's disease, Autonomic dysfunction, hypertension, BPH who presents from outside facility for altered mental status.  Patient is alert and oriented to self and place but unable to provide history. Reportedly he was found by his facility to be responsive only to painful stimuli and drooling.  On arrival to ED, patient was unable to verbally respond to questions.  His baseline repeatedly is independent and walks with a walker and is verbal.  He was recently hospitalized from 05/16/2021 05/24/2023 altered mental status likely due to labile blood pressure and autonomic dysfunction.  His midodrine was discontinued at discharge and he was started on amlodipine and metoprolol.  Patient was discharged to SNF at the recommendation of PT/OT.  ED Course: He was afebrile with a temperature of 99.4 and hypertensive up to 200s over 100s with heart rate between 80s and 90s on room air.  CBC shows leukocytosis of 18.3 and a stable hemoglobin of 11.6.  CMP showed glucose of 143, BUN of 26, GFR of 46 and creatinine of 1.67 from a prior of 1.12 and GFR greater than 60.  Urinalysis shows moderate leukocyte and negative nitrite with rare bacteria and greater than 50 WBC.   CT head shows mild atrophic changes without acute abnormality. CXR unremarkable   Assessment & Plan:   Principal Problem:   Acute metabolic encephalopathy Active Problems:   Parkinson disease ([place])   Autonomic dysfunction   UTI (urinary tract infection)   AKI (acute kidney injury) ([place])   Urinary tract infection in elderly patient  1 acute metabolic encephalopathy secondary to UTI Patient presented with altered mental status.  Head CT done was negative.  Urinalysis consistent with a UTI.  Urine cultures negative to date  however patient was on IV antibiotics and urine cultures not obtained on admission.  Patient improving clinically.  Continue IV fluids.  Transitioned from IV Rocephin to oral Omnicef to complete an empiric 5-day course of antibiotic treatment.  Supportive care.   2.  Hypertension/autonomic dysfunction On admission patient noted to have blood pressure with systolics up in the 123456.  Blood pressure improved on current regimen of amlodipine and metoprolol.  Patient on IV labetalol as needed.  Patient seen by physical therapy patient noted to be orthostatic however asymptomatic likely secondary to patient's autonomic dysfunction.  TED hose.  Patient's metoprolol increased for better blood pressure control this morning.  Follow.  3.  Acute kidney injury Likely secondary to prerenal azotemia.  Improved with hydration.  Follow.   4.  UTI Patient presented with acute metabolic encephalopathy.  Patient noted to have a leukocytosis.  Urinalysis done concerning for UTI.  Urine cultures however were not obtained on admission and patient had received IV antibiotics prior to urine culture has been obtained.  Urine cultures negative.  Transitioned from IV Rocephin to oral Omnicef to complete a 5-day course of empiric antibiotic treatment.  Follow.  5.  Parkinson's disease Continue Sinemet.   DVT prophylaxis Lovenox Code Status: Full Family Communication: Updated patient.  No family at bedside.  Disposition Plan: Skilled nursing facility on 06/04/2019.   Consultants:   None  Procedures:  CT head 05/29/2019  Chest x-ray 05/29/2019  Antimicrobials:  IV Rocephin 05/29/2019>>>> 05/31/2019  Omnicef 05/31/2019   Subjective: Patient sleeping deeply however arousable.  Denies any chest pain or shortness of breath.  Drifts back off to sleep.  Objective: Vitals:   06/01/19 2218 06/02/19 0457 06/02/19 0639 06/02/19 0737  BP: (!) 174/97 (!) 201/98 (!) 157/85 (!) 151/87  Pulse: 73 88 79 77  Resp:   20  18  Temp:  98.8 F (37.1 C)  98 F (36.7 C)  TempSrc:  Oral  Oral  SpO2:  100%  99%  Weight:      Height:        Intake/Output Summary (Last 24 hours) at 06/02/2019 1152 Last data filed at 06/02/2019 1100 Gross per 24 hour  Intake 480 ml  Output 2950 ml  Net -2470 ml   Filed Weights   05/30/19 0500 05/31/19 0430 06/01/19 0326  Weight: 85 kg 81.8 kg 82.7 kg    Examination:  General exam: NAD Respiratory system: CTAB.  No wheezes, no crackles, no rhonchi.  Normal respiratory effort.  Cardiovascular system: RRR no murmurs rubs or gallops.  No JVD.  No lower extremity edema.   Gastrointestinal system: Abdomen is nontender, nondistended, soft, positive bowel sounds.  No rebound.  No guarding.  Central nervous system: Sleeping however easily arousable. No focal neurological deficits. Extremities: Symmetric 5 x 5 power. Skin: No rashes, lesions or ulcers Psychiatry: Judgement and insight appear fair. Mood & affect appropriate.     Data Reviewed: I have personally reviewed following labs and imaging studies  CBC: Recent Labs  Lab 05/29/19 1916 05/30/19 0645 05/31/19 0449 06/01/19 0547 06/02/19 0757  WBC 18.3* 16.3* 14.4* 11.3* 10.0  NEUTROABS 14.1*  --   --   --   --   HGB 11.6* 11.0* 9.9* 10.5* 11.0*  HCT 36.0* 33.6* 31.2* 32.2* 33.9*  MCV 84.3 82.8 84.8 82.8 83.7  PLT 355 322 300 337 [id number]   Basic Metabolic Panel: Recent Labs  Lab 05/29/19 1916 05/30/19 0645 05/31/19 0449 06/01/19 0547 06/02/19 0757  NA 136 142 138 140 139  K 4.1 3.7 3.6 3.8 3.7  CL 103 110 109 108 107  CO2 24 25 23 24 25   GLUCOSE 143* 126* 171* 153* 139*  BUN 26* 20 15 14 16   CREATININE 1.67* 1.16 1.11 0.97 1.10  CALCIUM 9.3 8.7* 8.3* 8.7* 9.0   GFR: Estimated Creatinine Clearance: 63.7 mL/min (by C-G formula based on SCr of 1.1 mg/dL). Liver Function Tests: Recent Labs  Lab 05/29/19 1916  AST 24  ALT 6  ALKPHOS 61  BILITOT 0.9  PROT 7.5  ALBUMIN 3.6   No results for  input(s): LIPASE, AMYLASE in the last 168 hours. No results for input(s): AMMONIA in the last 168 hours. Coagulation Profile: No results for input(s): INR, PROTIME in the last 168 hours. Cardiac Enzymes: No results for input(s): CKTOTAL, CKMB, CKMBINDEX, TROPONINI in the last 168 hours. BNP (last 3 results) No results for input(s): PROBNP in the last 8760 hours. HbA1C: No results for input(s): HGBA1C in the last 72 hours. CBG: No results for input(s): GLUCAP in the last 168 hours. Lipid Profile: No results for input(s): CHOL, HDL, LDLCALC, TRIG, CHOLHDL, LDLDIRECT in the last 72 hours. Thyroid Function Tests: No results for input(s): TSH, T4TOTAL, FREET4, T3FREE, THYROIDAB in the last 72 hours. Anemia Panel: No results for input(s): VITAMINB12, FOLATE, FERRITIN, TIBC, IRON, RETICCTPCT in the last 72 hours. Sepsis Labs: Recent Labs  Lab 05/29/19 2335 05/30/19 0645  LATICACIDVEN 0.9 1.0    Recent Results (from the past 240 hour(s))  SARS CORONAVIRUS 2 (TAT 6-24 HRS)  Nasopharyngeal Nasopharyngeal Swab     Status: None   Collection Time: 05/29/19  8:47 PM   Specimen: Nasopharyngeal Swab  Result Value Ref Range Status   SARS Coronavirus 2 NEGATIVE NEGATIVE Final    Comment: (NOTE) SARS-CoV-2 target nucleic acids are NOT DETECTED. The SARS-CoV-2 RNA is generally detectable in upper and lower respiratory specimens during the acute phase of infection. Negative results do not preclude SARS-CoV-2 infection, do not rule out co-infections with other pathogens, and should not be used as the sole basis for treatment or other patient management decisions. Negative results must be combined with clinical observations, patient history, and epidemiological information. The expected result is Negative. Fact Sheet for Patients: SugarRoll.be Fact Sheet for Healthcare Providers: https://www.woods-mathews.com/ This test is not yet approved or cleared by  the [place] FDA and  has been authorized for detection and/or diagnosis of SARS-CoV-2 by FDA under an Emergency Use Authorization (EUA). This EUA will remain  in effect (meaning this test can be used) for the duration of the COVID-19 declaration under Section 56 4(b)(1) of the Act, 21 U.S.C. section 360bbb-3(b)(1), unless the authorization is terminated or revoked sooner. Performed at [facility], [place] [address] [place], [place] [address]   MRSA PCR Screening     Status: None   Collection Time: 05/30/19  6:01 AM   Specimen: Nasopharyngeal  Result Value Ref Range Status   MRSA by PCR NEGATIVE NEGATIVE Final    Comment:        The GeneXpert MRSA Assay (FDA approved for NASAL specimens only), is one component of a comprehensive MRSA colonization surveillance program. It is not intended to diagnose MRSA infection nor to guide or monitor treatment for MRSA infections. Performed at [facility], [address], [place], [place] [address]   Urine culture     Status: None   Collection Time: 05/30/19 10:33 AM   Specimen: Urine, Catheterized  Result Value Ref Range Status   Specimen Description   Final    URINE, CATHETERIZED Performed at [facility], [address] [place], [place] [address]    Special Requests   Final    NONE Performed at [facility], [address], [place], [place] [address]    Culture   Final    NO GROWTH Performed at [facility], [place] [address], [place],  [address]    Report Status 05/31/2019 FINAL  Final         Radiology Studies: No results found.      Scheduled Meds: . amLODipine  2.5 mg Oral Daily  . aspirin EC  81 mg Oral QODAY  . carbidopa-levodopa  1 tablet Oral Daily  . carbidopa-levodopa  2 tablet Oral BID WC  . cefdinir  300 mg Oral Q12H  . enoxaparin (LOVENOX) injection  40 mg Subcutaneous Q24H  . metoprolol tartrate  50 mg Oral BID  . polyethylene glycol  17 g Oral  Daily   Continuous Infusions:    LOS: 4 days    Time spent: 35 minutes    Irine Seal, MD [facility]  If 7PM-7AM, please contact night-coverage [email or web address] 06/02/2019, 11:52 AM

## 2019-06-03 NOTE — Progress Notes (Signed)
PROGRESS NOTE    Brandon Manning  [id number] DOB: [birth year] DOA: 05/29/2019 PCP: [name], MD    Brief Narrative:  HPI per Dr.Tu Brandon Manning is a 73 y.o. male with medical history significant of Parkinson's disease, Autonomic dysfunction, hypertension, BPH who presents from outside facility for altered mental status.  Patient is alert and oriented to self and place but unable to provide history. Reportedly he was found by his facility to be responsive only to painful stimuli and drooling.  On arrival to ED, patient was unable to verbally respond to questions.  His baseline repeatedly is independent and walks with a walker and is verbal.  He was recently hospitalized from 05/16/2021 05/24/2023 altered mental status likely due to labile blood pressure and autonomic dysfunction.  His midodrine was discontinued at discharge and he was started on amlodipine and metoprolol.  Patient was discharged to SNF at the recommendation of PT/OT.  ED Course: He was afebrile with a temperature of 99.4 and hypertensive up to 200s over 100s with heart rate between 80s and 90s on room air.  CBC shows leukocytosis of 18.3 and a stable hemoglobin of 11.6.  CMP showed glucose of 143, BUN of 26, GFR of 46 and creatinine of 1.67 from a prior of 1.12 and GFR greater than 60.  Urinalysis shows moderate leukocyte and negative nitrite with rare bacteria and greater than 50 WBC.   CT head shows mild atrophic changes without acute abnormality. CXR unremarkable   Assessment & Plan:   Principal Problem:   Acute metabolic encephalopathy Active Problems:   Parkinson disease ([place])   Autonomic dysfunction   UTI (urinary tract infection)   AKI (acute kidney injury) ([place])   Urinary tract infection in elderly patient  1 acute metabolic encephalopathy secondary to UTI Patient presented with altered mental status.  Head CT done was negative.  Urinalysis consistent with a UTI.  Urine cultures negative to date  however patient was on IV antibiotics and urine cultures not obtained on admission.  Patient improved clinically.  IV fluids have been discontinued.  Status post 5-day course of empiric antibiotic treatment.   2.  Hypertension/autonomic dysfunction On admission patient noted to have blood pressure with systolics up in the 123456.  Blood pressure improved on current regimen of amlodipine and metoprolol.  Patient on IV labetalol as needed.  Patient seen by physical therapy patient noted to be orthostatic however asymptomatic likely secondary to patient's autonomic dysfunction.  TED hose.  Patient's metoprolol increased for better blood pressure control this morning.  Follow.  3.  Acute kidney injury Likely secondary to prerenal azotemia.  Improved with hydration.  Follow.   4.  UTI Patient presented with acute metabolic encephalopathy.  Patient noted to have a leukocytosis.  Urinalysis done concerning for UTI.  Urine cultures however were not obtained on admission and patient had received IV antibiotics prior to urine culture has been obtained.  Urine cultures negative.  Patient was on IV Rocephin and transition to oral Omnicef and has completed 5-day course of empiric antibiotic treatment.   5.  Parkinson's disease Continue Sinemet.   DVT prophylaxis Lovenox Code Status: Full Family Communication: Updated patient.  Updated wife.  Disposition Plan: Skilled nursing facility on 06/04/2019.   Consultants:   None  Procedures:  CT head 05/29/2019  Chest x-ray 05/29/2019  Antimicrobials:  IV Rocephin 05/29/2019>>>> 05/31/2019  Omnicef 05/31/2019>>>>> 06/02/2019   Subjective: Patient sitting up in bed.  Denies any chest pain or shortness of breath.  Objective: Vitals:   06/02/19 1732 06/02/19 2134 06/03/19 0637 06/03/19 0803  BP: 139/77 (!) 160/95 (!) 148/99 (!) 153/87  Pulse: 71 74 75 79  Resp: 18 20 16    Temp: 98.4 F (36.9 C) 98.1 F (36.7 C) 98 F (36.7 C) 98.7 F (37.1  C)  TempSrc: Oral Oral  Oral  SpO2: 100% 100% 100% 100%  Weight:   80.6 kg   Height:        Intake/Output Summary (Last 24 hours) at 06/03/2019 1210 Last data filed at 06/03/2019 [id number] Gross per 24 hour  Intake -  Output 1125 ml  Net -1125 ml   Filed Weights   05/31/19 0430 06/01/19 0326 06/03/19 0637  Weight: 81.8 kg 82.7 kg 80.6 kg    Examination:  General exam: NAD Respiratory system: Lungs clear to auscultation bilaterally.  No wheezes, no crackles, no rhonchi.  Normal respiratory effort. Cardiovascular system: Regular rate and rhythm no murmurs rubs or gallops.  No JVD.  No lower extremity edema. Gastrointestinal system: Abdomen is soft, nontender, nondistended, positive bowel sounds.  No rebound.  No guarding.   Central nervous system: Alert. No focal neurological deficits. Extremities: Symmetric 5 x 5 power. Skin: No rashes, lesions or ulcers Psychiatry: Judgement and insight appear fair. Mood & affect appropriate.     Data Reviewed: I have personally reviewed following labs and imaging studies  CBC: Recent Labs  Lab 05/29/19 1916 05/30/19 0645 05/31/19 0449 06/01/19 0547 06/02/19 0757  WBC 18.3* 16.3* 14.4* 11.3* 10.0  NEUTROABS 14.1*  --   --   --   --   HGB 11.6* 11.0* 9.9* 10.5* 11.0*  HCT 36.0* 33.6* 31.2* 32.2* 33.9*  MCV 84.3 82.8 84.8 82.8 83.7  PLT 355 322 300 337 [id number]   Basic Metabolic Panel: Recent Labs  Lab 05/29/19 1916 05/30/19 0645 05/31/19 0449 06/01/19 0547 06/02/19 0757  NA 136 142 138 140 139  K 4.1 3.7 3.6 3.8 3.7  CL 103 110 109 108 107  CO2 24 25 23 24 25   GLUCOSE 143* 126* 171* 153* 139*  BUN 26* 20 15 14 16   CREATININE 1.67* 1.16 1.11 0.97 1.10  CALCIUM 9.3 8.7* 8.3* 8.7* 9.0   GFR: Estimated Creatinine Clearance: 63.7 mL/min (by C-G formula based on SCr of 1.1 mg/dL). Liver Function Tests: Recent Labs  Lab 05/29/19 1916  AST 24  ALT 6  ALKPHOS 61  BILITOT 0.9  PROT 7.5  ALBUMIN 3.6   No results for input(s):  LIPASE, AMYLASE in the last 168 hours. No results for input(s): AMMONIA in the last 168 hours. Coagulation Profile: No results for input(s): INR, PROTIME in the last 168 hours. Cardiac Enzymes: No results for input(s): CKTOTAL, CKMB, CKMBINDEX, TROPONINI in the last 168 hours. BNP (last 3 results) No results for input(s): PROBNP in the last 8760 hours. HbA1C: No results for input(s): HGBA1C in the last 72 hours. CBG: No results for input(s): GLUCAP in the last 168 hours. Lipid Profile: No results for input(s): CHOL, HDL, LDLCALC, TRIG, CHOLHDL, LDLDIRECT in the last 72 hours. Thyroid Function Tests: No results for input(s): TSH, T4TOTAL, FREET4, T3FREE, THYROIDAB in the last 72 hours. Anemia Panel: No results for input(s): VITAMINB12, FOLATE, FERRITIN, TIBC, IRON, RETICCTPCT in the last 72 hours. Sepsis Labs: Recent Labs  Lab 05/29/19 2335 05/30/19 0645  LATICACIDVEN 0.9 1.0    Recent Results (from the past 240 hour(s))  SARS CORONAVIRUS 2 (TAT 6-24 HRS) Nasopharyngeal Nasopharyngeal Swab     Status:  None   Collection Time: 05/29/19  8:47 PM   Specimen: Nasopharyngeal Swab  Result Value Ref Range Status   SARS Coronavirus 2 NEGATIVE NEGATIVE Final    Comment: (NOTE) SARS-CoV-2 target nucleic acids are NOT DETECTED. The SARS-CoV-2 RNA is generally detectable in upper and lower respiratory specimens during the acute phase of infection. Negative results do not preclude SARS-CoV-2 infection, do not rule out co-infections with other pathogens, and should not be used as the sole basis for treatment or other patient management decisions. Negative results must be combined with clinical observations, patient history, and epidemiological information. The expected result is Negative. Fact Sheet for Patients: SugarRoll.be Fact Sheet for Healthcare Providers: https://www.woods-mathews.com/ This test is not yet approved or cleared by the [place] FDA and  has been authorized for detection and/or diagnosis of SARS-CoV-2 by FDA under an Emergency Use Authorization (EUA). This EUA will remain  in effect (meaning this test can be used) for the duration of the COVID-19 declaration under Section 56 4(b)(1) of the Act, 21 U.S.C. section 360bbb-3(b)(1), unless the authorization is terminated or revoked sooner. Performed at [facility], [place] [address] [place], [place] [address]   MRSA PCR Screening     Status: None   Collection Time: 05/30/19  6:01 AM   Specimen: Nasopharyngeal  Result Value Ref Range Status   MRSA by PCR NEGATIVE NEGATIVE Final    Comment:        The GeneXpert MRSA Assay (FDA approved for NASAL specimens only), is one component of a comprehensive MRSA colonization surveillance program. It is not intended to diagnose MRSA infection nor to guide or monitor treatment for MRSA infections. Performed at [facility], [address], [place] [address]   Urine culture     Status: None   Collection Time: 05/30/19 10:33 AM   Specimen: Urine, Catheterized  Result Value Ref Range Status   Specimen Description   Final    URINE, CATHETERIZED Performed at [facility], [address] [place], [place] [address]    Special Requests   Final    NONE Performed at [facility], [address], [place], [place] [address]    Culture   Final    NO GROWTH Performed at [facility], [place] [address], [place],  [address]    Report Status 05/31/2019 FINAL  Final         Radiology Studies: No results found.      Scheduled Meds: . amLODipine  2.5 mg Oral Daily  . aspirin EC  81 mg Oral QODAY  . carbidopa-levodopa  1 tablet Oral Daily  . carbidopa-levodopa  2 tablet Oral BID WC  . enoxaparin (LOVENOX) injection  40 mg Subcutaneous Q24H  . metoprolol tartrate  50 mg Oral BID  . polyethylene glycol  17 g Oral Daily   Continuous Infusions:    LOS: 5  days    Time spent: 35 minutes    Irine Seal, MD [facility]  If 7PM-7AM, please contact night-coverage [email or web address] 06/03/2019, 12:10 PM

## 2019-06-04 DIAGNOSIS — N179 Acute kidney failure, unspecified: Secondary | ICD-10-CM | POA: Diagnosis not present

## 2019-06-04 DIAGNOSIS — R4182 Altered mental status, unspecified: Secondary | ICD-10-CM | POA: Diagnosis not present

## 2019-06-04 DIAGNOSIS — G20 Parkinson's disease: Secondary | ICD-10-CM | POA: Diagnosis not present

## 2019-06-04 DIAGNOSIS — Z87891 Personal history of nicotine dependence: Secondary | ICD-10-CM | POA: Diagnosis not present

## 2019-06-04 DIAGNOSIS — Z7401 Bed confinement status: Secondary | ICD-10-CM | POA: Diagnosis not present

## 2019-06-04 DIAGNOSIS — M2550 Pain in unspecified joint: Secondary | ICD-10-CM | POA: Diagnosis not present

## 2019-06-04 DIAGNOSIS — R404 Transient alteration of awareness: Secondary | ICD-10-CM | POA: Diagnosis not present

## 2019-06-04 DIAGNOSIS — G9341 Metabolic encephalopathy: Secondary | ICD-10-CM | POA: Diagnosis not present

## 2019-06-04 DIAGNOSIS — R5381 Other malaise: Secondary | ICD-10-CM | POA: Diagnosis not present

## 2019-06-04 DIAGNOSIS — I959 Hypotension, unspecified: Secondary | ICD-10-CM | POA: Diagnosis not present

## 2019-06-04 DIAGNOSIS — I10 Essential (primary) hypertension: Secondary | ICD-10-CM | POA: Diagnosis not present

## 2019-06-04 DIAGNOSIS — E538 Deficiency of other specified B group vitamins: Secondary | ICD-10-CM | POA: Diagnosis not present

## 2019-06-04 DIAGNOSIS — G909 Disorder of the autonomic nervous system, unspecified: Secondary | ICD-10-CM | POA: Diagnosis not present

## 2019-06-04 DIAGNOSIS — N401 Enlarged prostate with lower urinary tract symptoms: Secondary | ICD-10-CM | POA: Diagnosis not present

## 2019-06-04 DIAGNOSIS — N3941 Urge incontinence: Secondary | ICD-10-CM | POA: Diagnosis not present

## 2019-06-04 DIAGNOSIS — R52 Pain, unspecified: Secondary | ICD-10-CM | POA: Diagnosis not present

## 2019-06-04 DIAGNOSIS — G9340 Encephalopathy, unspecified: Secondary | ICD-10-CM | POA: Diagnosis not present

## 2019-06-04 DIAGNOSIS — I951 Orthostatic hypotension: Secondary | ICD-10-CM | POA: Diagnosis not present

## 2019-06-04 DIAGNOSIS — E559 Vitamin D deficiency, unspecified: Secondary | ICD-10-CM | POA: Diagnosis not present

## 2019-06-04 DIAGNOSIS — N390 Urinary tract infection, site not specified: Secondary | ICD-10-CM | POA: Diagnosis not present

## 2019-06-04 DIAGNOSIS — N3000 Acute cystitis without hematuria: Secondary | ICD-10-CM | POA: Diagnosis not present

## 2019-06-04 DIAGNOSIS — I159 Secondary hypertension, unspecified: Secondary | ICD-10-CM | POA: Diagnosis not present

## 2019-06-04 LAB — BASIC METABOLIC PANEL
Anion gap: 10 (ref 5–15)
BUN: 23 mg/dL (ref 8–23)
CO2: 23 mmol/L (ref 22–32)
Calcium: 9 mg/dL (ref 8.9–10.3)
Chloride: 108 mmol/L (ref 98–111)
Creatinine, Ser: 1.25 mg/dL — ABNORMAL HIGH (ref 0.61–1.24)
GFR calc Af Amer: >60 mL/min (ref >60)
GFR calc non Af Amer: 56 mL/min — ABNORMAL LOW (ref >60)
Glucose, Bld: 124 mg/dL — ABNORMAL HIGH (ref 70–99)
Potassium: 3.9 mmol/L (ref 3.5–5.1)
Sodium: 141 mmol/L (ref 135–145)

## 2019-06-04 MED ORDER — METOPROLOL TARTRATE 50 MG PO TABS: 50.0000 mg | ORAL_TABLET | Freq: Two times a day (BID) | ORAL | 1 refills | Status: DC

## 2019-06-04 NOTE — Evaluation (Signed)
Occupational Therapy Evaluation Patient Details Name: Brandon Manning MRN: [id number] DOB: [birth year] Today's Date: 06/04/2019    History of Present Illness Pt is 73 y/o M who presented to ER from STR secondary to AMS, decreased responsiveness; admitted for mangement of acute metabolic encephalopathy due to UTI.   Clinical Impression   Pt seen for OT eval this date to assess safety and capability as it pertains to ADLs and ADL mobility in setting of hospitalization d/t acute metabolic encephalopathy. Pt lives with spouse in [facility] with level entry. He was able to perform most self care (wife assists with bathing and performs IADLs at baseline). At this time, pt requiring increased assist based on OT assessment/observation/treatment. OT assists pt with t/f to [facility] with MOD A arm in arm with CNA assisting pt to pivot hips. Pt requires MOD A to tolerate static stand while CNA provides MAX to total A for peri care. OT facilitates t/f back to bed with RW with MOD A and MOD A for sit to sup. Pt requires MOD/MAX A for repositioning in bed for propulsion towards HOB. Pt left with call bell in reach and bed alarm set. SNF appears to be most prudent d/c dispo as pt is requiring increased assistance for ADLs and ADL mobility at this time.     Follow Up Recommendations  SNF;Supervision/Assistance - 24 hour    Equipment Recommendations  (defer to next venue of care)    Recommendations for Other Services       Precautions / Restrictions Precautions Precautions: Fall Precaution Comments: autonomic dysfunction/orthostatic Restrictions Weight Bearing Restrictions: No      Mobility Bed Mobility Overal bed mobility: Needs Assistance Bed Mobility: Supine to Sit     Supine to sit: Min assist;HOB elevated Sit to supine: Mod assist   General bed mobility comments: increased time  Transfers Overall transfer level: Needs assistance Equipment used: Rolling walker (2 wheeled) Transfers: Sit to/from  Stand Sit to Stand: Mod assist;+2 physical assistance              Balance Overall balance assessment: Needs assistance Sitting-balance support: Feet supported;Single extremity supported Sitting balance-Leahy Scale: Fair Sitting balance - Comments: posterolateral (Right) lean in EOB sitting until MOD A for seated scooting provided to stabilize b/l feet on ground to increase stability.   Standing balance support: Bilateral upper extremity supported;During functional activity Standing balance-Leahy Scale: Poor                             ADL either performed or assessed with clinical judgement   ADL                                         General ADL Comments: Pt requires MOD A for fxl t/f's and bed mob at this time, completes self feeding and grooming with setup and increased time at bed level with HOB elevated. In EOB sitting, pt with F- static sitting balance requiring increased A (MIN/MOD A) for UB ADLs in sitting. Pt performs t/f from bed to commode with MOD A with arm in arm and second person to assist pt with swinging hips/hand placement. (comes to partial stand before OT can retrieve RW prompting arm in arm t/f). Attempts to perform peri care in sitting/lateral lean after BM, but unable to complete. Pt requires MOD A for CTS with RW and  for static standing balance while second person completes peri care with MAX A. Pt requires MOD A x 1 person and RW to t/f back to bed. MOD/MAX A for propulsion to optimize positioning and MIN/MOD A with UB dressing.     Vision Baseline Vision/History: Wears glasses Wears Glasses: At all times Patient Visual Report: No change from baseline       Perception     Praxis      Pertinent Vitals/Pain Pain Assessment: No/denies pain     Hand Dominance Left   Extremity/Trunk Assessment Upper Extremity Assessment Upper Extremity Assessment: RUE deficits/detail;LUE deficits/detail RUE Deficits / Details: grossly  >3/5 LUE Deficits / Details: grossly >3/5   Lower Extremity Assessment Lower Extremity Assessment: Defer to PT evaluation;Generalized weakness   Cervical / Trunk Assessment Cervical / Trunk Assessment: Kyphotic   Communication Communication Communication: HOH   Cognition Arousal/Alertness: Awake/alert Behavior During Therapy: WFL for tasks assessed/performed Overall Cognitive Status: History of cognitive impairments - at baseline                                 General Comments: Pt requires increased processing time with some questions, and demos some decreased safety awareness, but overall apropriate with Q&A.   General Comments       Exercises Other Exercises Other Exercises: OT facilitates pt education re: fall prevention, use of call bell, awareness of bed alarm, noted balance defecits, general safety awareness.   Shoulder Instructions      Home Living Family/patient expects to be discharged to:: Skilled nursing facility Living Arrangements: Spouse/significant other Available Help at Discharge: Family;Available 24 hours/day Type of Home: House Home Access: Level entry     Home Layout: One level     Bathroom Shower/Tub: Occupational psychologist: Handicapped height Bathroom Accessibility: Yes How Accessible: Accessible via walker Home Equipment: Shower seat;Walker - 4 wheels;Cane - single point;Grab bars - tub/shower;Hand held shower head;Wheelchair - manual          Prior Functioning/Environment Level of Independence: Independent with assistive device(s);Needs assistance  Gait / Transfers Assistance Needed: Gleaned from previous evaluation: Caregiver reports pt ambulates with [facility] for HH distances, and uses a 2[facility] for community distances. Is not leaving the house much 2/2 COVID ADL's / Homemaking Assistance Needed: Gleaned from previous evaluation: Per caregiver, pt is able to dress and feed himself. He requires some assistance with showering.  Wife manages IADLs including driving, mediation management, cleaning, etc.            OT Problem List: Decreased strength;Decreased coordination;Cardiopulmonary status limiting activity;Decreased range of motion;Decreased cognition;Decreased activity tolerance;Decreased safety awareness;Decreased knowledge of use of DME or AE;Impaired balance (sitting and/or standing)      OT Treatment/Interventions:      OT Goals(Current goals can be found in the care plan section) Acute Rehab OT Goals Patient Stated Goal: To get to rehab and get better. OT Goal Formulation: All assessment and education complete, DC therapy  OT Frequency:     Barriers to D/C:            Co-evaluation              AM-PAC OT "6 Clicks" Daily Activity     Outcome Measure Help from another person eating meals?: None Help from another person taking care of personal grooming?: A Little Help from another person toileting, which includes using toliet, bedpan, or urinal?: A Lot Help from  another person bathing (including washing, rinsing, drying)?: A Lot   Help from another person to put on and taking off regular lower body clothing?: A Lot 6 Click Score: 13   End of Session Equipment Utilized During Treatment: Gait belt;Rolling walker Nurse Communication: Mobility status  Activity Tolerance: Patient tolerated treatment well Patient left: in bed;with call bell/phone within reach;with bed alarm set;with nursing/sitter in room(CNA in room finishing up situating pt-giving blanket, giving BST, arranging room furtniture, etc.)  OT Visit Diagnosis: Other abnormalities of gait and mobility (R26.89);Muscle weakness (generalized) (M62.81)                Time: 1040-1105 OT Time Calculation (min): 25 min Charges:  OT General Charges $OT Visit: 1 Visit OT Evaluation $OT Eval Moderate Complexity: 1 Mod OT Treatments $Self Care/Home Management : 8-22 mins  Gerrianne Scale, MS, OTR/L ascom [phone] or  [phone] 06/04/19, 11:31 AM

## 2019-06-04 NOTE — Progress Notes (Signed)
Report given to Early Osmond, LPN at [facility], [facility] of transport notified.

## 2019-06-04 NOTE — Care Management Important Message (Signed)
Important Message  Patient Details  Name: Brandon Manning MRN: [id number] Date of Birth: 08/10/1945   Medicare Important Message Given:  Yes     Dannette Barbara 06/04/2019, 12:19 PM

## 2019-06-04 NOTE — Progress Notes (Signed)
Pt departed with EMS for non-emergent transportation to [facility].

## 2019-06-04 NOTE — Discharge Summary (Signed)
Physician Discharge Summary  Brandon Manning [id number] DOB: 08/29/1945 DOA: 05/29/2019  PCP: [name], MD  Admit date: 05/29/2019 Discharge date: 06/04/2019  Time spent: 50 minutes  Recommendations for Outpatient Follow-up:  1. Follow-up with MD at skilled nursing facility.  Patient will need a basic metabolic profile done in 1 week to follow-up on electrolytes and renal function.   Discharge Diagnoses:  Principal Problem:   Acute metabolic encephalopathy Active Problems:   Parkinson disease ([place])   Autonomic dysfunction   UTI (urinary tract infection)   AKI (acute kidney injury) ([place])   Urinary tract infection in elderly patient   Discharge Condition: Stable and improved  Diet recommendation: Heart healthy  Filed Weights   06/01/19 0326 06/03/19 0637 06/04/19 0351  Weight: 82.7 kg 80.6 kg 81.1 kg    History of present illness:  Per Dr.[name] is a 73 y.o. male with medical history significant of Parkinson's disease, Autonomic dysfunction, hypertension, BPH who presented from outside facility for altered mental status.  Patient was alert and oriented to self and place but unable to provide history. Reportedly he was found by his facility to be responsive only to painful stimuli and drooling.  On arrival to ED, patient was unable to verbally respond to questions.  His baseline repeatedly is independent and walks with a walker and is verbal.  He was recently hospitalized from 05/16/2021 05/24/2023 altered mental status likely due to labile blood pressure and autonomic dysfunction.  His midodrine was discontinued at discharge and he was started on amlodipine and metoprolol.  Patient was discharged to SNF at the recommendation of PT/OT.  ED Course: He was afebrile with a temperature of 99.4 and hypertensive up to 200s over 100s with heart rate between 80s and 90s on room air.  CBC shows leukocytosis of 18.3 and a stable hemoglobin of 11.6.  CMP showed  glucose of 143, BUN of 26, GFR of 46 and creatinine of 1.67 from a prior of 1.12 and GFR greater than 60.  Urinalysis shows moderate leukocyte and negative nitrite with rare bacteria and greater than 50 WBC.   CT head shows mild atrophic changes without acute abnormality. CXR unremarkable.   Hospital Course:  1 acute metabolic encephalopathy secondary to UTI Patient presented with altered mental status.  Head CT done was negative.  Urinalysis consistent with a UTI.  Urine cultures negative to date however patient was on IV antibiotics and urine cultures not obtained on admission.  Patient improved clinically on IV antibiotics as well as IV fluids.  Patient remained afebrile.  Leukocytosis trended down.  IV Rocephin was subsequently transitioned to oral Omnicef and patient completed 5-day course of antibiotic treatment.  No further antibiotics needed on discharge.  Patient was close to baseline by day of discharge.  Outpatient follow-up.    2.  Hypertension/autonomic dysfunction On admission patient noted to have blood pressure with systolics up in the [id number].  Blood pressure improved on current regimen of amlodipine and metoprolol.  Patient on IV labetalol as needed.  Patient seen by physical therapy patient noted to be orthostatic however asymptomatic likely secondary to patient's autonomic dysfunction.  Patient maintained on TED hose.  Patient maintained on home regimen of antihypertensive medication of Norvasc 2.5 mg daily.  Patient's metoprolol was increased to 50 mg twice daily for better blood pressure control.  Outpatient follow-up.   3.  Acute kidney injury Likely secondary to prerenal azotemia.  Renal function improved and had resolved with hydration.  4.  UTI Patient presented with acute metabolic encephalopathy.  Patient noted to have a leukocytosis.  Urinalysis done concerning for UTI.  Urine cultures however were not obtained on admission and patient had received IV antibiotics prior to  urine culture being obtained.  Urine cultures negative.  Patient was maintained on IV Rocephin and subsequently transition to oral Omnicef.  Patient completed course of antibiotics during the hospitalization.  No further antibiotics needed.   5.  Parkinson's disease Patient was maintained on Sinemet.  Procedures:  CT head 05/29/2019  Chest x-ray 05/29/2019   Consultations:  None  Discharge Exam: Vitals:   06/04/19 0351 06/04/19 0740  BP: (!) 157/99 (!) 139/94  Pulse: 74 76  Resp: 20 19  Temp: 98.1 F (36.7 C) 97.9 F (36.6 C)  SpO2: 99% 100%    General: NAD Cardiovascular: RRR Respiratory: CTAB  Discharge Instructions   Discharge Instructions    Diet - low sodium heart healthy   Complete by: As directed    Increase activity slowly   Complete by: As directed    Increase activity slowly   Complete by: As directed      Allergies as of 06/04/2019      Reactions   Penicillins Rash   Has patient had a PCN reaction causing immediate rash, facial/tongue/throat swelling, SOB or lightheadedness with hypotension: No Has patient had a PCN reaction causing severe rash involving mucus membranes or skin necrosis: No Has patient had a PCN reaction that required hospitalization No Has patient had a PCN reaction occurring within the last 10 years: No If all of the above answers are "NO", then may proceed with Cephalosporin use.      Medication List    TAKE these medications   amLODipine 2.5 MG tablet Commonly known as: NORVASC Take 1 tablet (2.5 mg total) by mouth daily.   aspirin EC 81 MG tablet Take 81 mg by mouth every other day.   carbidopa-levodopa 25-100 MG tablet Commonly known as: SINEMET IR 2 in the 7am, 2 at noon, 1 at 4pm   metoprolol tartrate 50 MG tablet Commonly known as: LOPRESSOR Take 1 tablet (50 mg total) by mouth 2 (two) times daily. What changed:   medication strength  how much to take   MULTIVITAMIN PO Take 1 tablet by mouth daily.    polyethylene glycol 17 g packet Commonly known as: MIRALAX / GLYCOLAX Take 17 g by mouth daily.   Vitamin D 50 MCG (2000 UT) Caps Take 1 capsule (2,000 Units total) by mouth daily.      Allergies  Allergen Reactions  . Penicillins Rash    Has patient had a PCN reaction causing immediate rash, facial/tongue/throat swelling, SOB or lightheadedness with hypotension: No Has patient had a PCN reaction causing severe rash involving mucus membranes or skin necrosis: No Has patient had a PCN reaction that required hospitalization No Has patient had a PCN reaction occurring within the last 10 years: No If all of the above answers are "NO", then may proceed with Cephalosporin use.    Contact information for follow-up providers    MD AT SNF Follow up.            Contact information for after-discharge care    [facility] .   Service: Skilled Chiropodist information: [employer] [place] [phone]                   The  results of significant diagnostics from this hospitalization (including imaging, microbiology, ancillary and laboratory) are listed below for reference.    Significant Diagnostic Studies: Ct Head Wo Contrast  Result Date: 05/29/2019 CLINICAL DATA:  Unresponsive EXAM: CT HEAD WITHOUT CONTRAST TECHNIQUE: Contiguous axial images were obtained from the base of the skull through the vertex without intravenous contrast. COMPARISON:  05/13/2019 FINDINGS: Brain: Mild atrophic changes are noted. No findings to suggest acute hemorrhage, acute infarction or space-occupying mass lesion are noted. Vascular: No hyperdense vessel or unexpected calcification. Skull: Normal. Negative for fracture or focal lesion. Sinuses/Orbits: No acute finding. Other: None. IMPRESSION: Mild atrophic changes without acute abnormality. Electronically Signed   By: Inez Catalina M.D.   On: 05/29/2019 19:51   Ct Head Wo  Contrast  Result Date: 05/13/2019 CLINICAL DATA:  Syncope, weakness, dizziness EXAM: CT HEAD WITHOUT CONTRAST TECHNIQUE: Contiguous axial images were obtained from the base of the skull through the vertex without intravenous contrast. COMPARISON:  01/25/2017, 08/05/2009 FINDINGS: Brain: No evidence of acute infarction, hemorrhage, extra-axial collection, ventriculomegaly, or mass effect. Generalized cerebral atrophy. Periventricular white matter low attenuation likely secondary to microangiopathy. Vascular: Cerebrovascular atherosclerotic calcifications are noted. Skull: Negative for fracture or focal lesion. Sinuses/Orbits: Visualized portions of the orbits are unremarkable. Visualized portions of the paranasal sinuses and mastoid air cells are unremarkable. Other: Partially visualized sebaceous cyst in the posterior scalp. IMPRESSION: No acute intracranial pathology. Electronically Signed   By: Kathreen Devoid   On: 05/13/2019 17:43   Dg Chest Port 1 View  Result Date: 05/29/2019 CLINICAL DATA:  Altered mental status EXAM: PORTABLE CHEST 1 VIEW COMPARISON:  05/13/2019 chest radiograph. FINDINGS: Stable cardiomediastinal silhouette with normal heart size. No pneumothorax. No pleural effusion. Lungs appear clear, with no acute consolidative airspace disease and no pulmonary edema. IMPRESSION: No active disease. Electronically Signed   By: Ilona Sorrel M.D.   On: 05/29/2019 19:26   Dg Chest Portable 1 View  Result Date: 05/13/2019 CLINICAL DATA:  Syncope, weakness, dizziness EXAM: PORTABLE CHEST 1 VIEW COMPARISON:  01/25/2017 FINDINGS: The heart size and mediastinal contours are within normal limits. Both lungs are clear. The visualized skeletal structures are unremarkable. IMPRESSION: No active disease. Electronically Signed   By: Kathreen Devoid   On: 05/13/2019 17:40    Microbiology: Recent Results (from the past 240 hour(s))  SARS CORONAVIRUS 2 (TAT 6-24 HRS) Nasopharyngeal Nasopharyngeal Swab      Status: None   Collection Time: 05/29/19  8:47 PM   Specimen: Nasopharyngeal Swab  Result Value Ref Range Status   SARS Coronavirus 2 NEGATIVE NEGATIVE Final    Comment: (NOTE) SARS-CoV-2 target nucleic acids are NOT DETECTED. The SARS-CoV-2 RNA is generally detectable in upper and lower respiratory specimens during the acute phase of infection. Negative results do not preclude SARS-CoV-2 infection, do not rule out co-infections with other pathogens, and should not be used as the sole basis for treatment or other patient management decisions. Negative results must be combined with clinical observations, patient history, and epidemiological information. The expected result is Negative. Fact Sheet for Patients: SugarRoll.be Fact Sheet for Healthcare Providers: https://www.woods-mathews.com/ This test is not yet approved or cleared by the [place] FDA and  has been authorized for detection and/or diagnosis of SARS-CoV-2 by FDA under an Emergency Use Authorization (EUA). This EUA will remain  in effect (meaning this test can be used) for the duration of the COVID-19 declaration under Section 56 4(b)(1) of the Act, 21 U.S.C. section 360bbb-3(b)(1), unless the authorization  is terminated or revoked sooner. Performed at [facility], [place] [address] [place], [place] [address]   MRSA PCR Screening     Status: None   Collection Time: 05/30/19  6:01 AM   Specimen: Nasopharyngeal  Result Value Ref Range Status   MRSA by PCR NEGATIVE NEGATIVE Final    Comment:        The GeneXpert MRSA Assay (FDA approved for NASAL specimens only), is one component of a comprehensive MRSA colonization surveillance program. It is not intended to diagnose MRSA infection nor to guide or monitor treatment for MRSA infections. Performed at [facility], [address], [place], [place] [address]   Urine culture     Status: None   Collection  Time: 05/30/19 10:33 AM   Specimen: Urine, Catheterized  Result Value Ref Range Status   Specimen Description   Final    URINE, CATHETERIZED Performed at [facility], [address] [place], [place] [address]    Special Requests   Final    NONE Performed at [facility], [address], [place], [place] [address]    Culture   Final    NO GROWTH Performed at [facility], [place] [address], [place],  [address]    Report Status 05/31/2019 FINAL  Final     Labs: Basic Metabolic Panel: Recent Labs  Lab 05/30/19 0645 05/31/19 0449 06/01/19 0547 06/02/19 0757 06/04/19 0518  NA 142 138 140 139 141  K 3.7 3.6 3.8 3.7 3.9  CL 110 109 108 107 108  CO2 25 23 24 25 23   GLUCOSE 126* 171* 153* 139* 124*  BUN 20 15 14 16 23   CREATININE 1.16 1.11 0.97 1.10 1.25*  CALCIUM 8.7* 8.3* 8.7* 9.0 9.0   Liver Function Tests: Recent Labs  Lab 05/29/19 1916  AST 24  ALT 6  ALKPHOS 61  BILITOT 0.9  PROT 7.5  ALBUMIN 3.6   No results for input(s): LIPASE, AMYLASE in the last 168 hours. No results for input(s): AMMONIA in the last 168 hours. CBC: Recent Labs  Lab 05/29/19 1916 05/30/19 0645 05/31/19 0449 06/01/19 0547 06/02/19 0757  WBC 18.3* 16.3* 14.4* 11.3* 10.0  NEUTROABS 14.1*  --   --   --   --   HGB 11.6* 11.0* 9.9* 10.5* 11.0*  HCT 36.0* 33.6* 31.2* 32.2* 33.9*  MCV 84.3 82.8 84.8 82.8 83.7  PLT 355 322 300 337 382   Cardiac Enzymes: No results for input(s): CKTOTAL, CKMB, CKMBINDEX, TROPONINI in the last 168 hours. BNP: BNP (last 3 results) No results for input(s): BNP in the last 8760 hours.  ProBNP (last 3 results) No results for input(s): PROBNP in the last 8760 hours.  CBG: No results for input(s): GLUCAP in the last 168 hours.     Signed:  Irine Seal MD.  [facility] 06/04/2019, 10:16 AM

## 2019-06-04 NOTE — TOC Transition Note (Signed)
Transition of Care [facility]) - CM/SW Discharge Note   Patient Details  Name: Brandon Manning MRN: [id number] Date of Birth: 25-Jun-1946  Transition of [facility]) CM/SW Contact:  [name], LCSW Phone Number: 06/04/2019, 10:37 AM   Clinical Narrative: Patient to be d/c'ed today to [facility].  Patient and family agreeable to plans will transport via ems RN to call report [phone].  CSW notified patient's wife, who is aware that patient will be discharging back to SNF today to continue with rehab.   Final next level of care: Skilled Nursing Facility Barriers to Discharge: Barriers Resolved   Patient Goals and CMS Choice Patient states their goals for this hospitalization and ongoing recovery are:: To go to SNF for short term rehab, then return back home. CMS Medicare.gov Compare Post Acute Care list provided to:: Patient Represenative (must comment) Choice offered to / list presented to : Spouse  Discharge Placement  Patient will be discharging back to SNF to continue his therapy.    Existing PASRR number confirmed : 05/31/19          Patient [facility] bed at: [facility] Patient to be transferred to facility by: [facility] Name of family member notified: Wife [name] [phone] Patient and family notified of of transfer: 06/04/19  Discharge Plan and Services In-house Referral: Clinical Social Work Discharge Planning Services: NA            DME Arranged: N/A DME Agency: NA       HH Arranged: NA          Social Determinants of Health (SDOH) Interventions     Readmission Risk Interventions No flowsheet data found.

## 2019-06-05 ENCOUNTER — Other Ambulatory Visit: Payer: Self-pay

## 2019-06-05 ENCOUNTER — Emergency Department
Admission: EM | Admit: 2019-06-05 | Discharge: 2019-06-05 | Disposition: A | Discharge status: Skilled Nursing Facility | Payer: Medicare HMO | Attending: Emergency Medicine | Admitting: Emergency Medicine

## 2019-06-05 DIAGNOSIS — I10 Essential (primary) hypertension: Secondary | ICD-10-CM | POA: Diagnosis not present

## 2019-06-05 DIAGNOSIS — G9340 Encephalopathy, unspecified: Secondary | ICD-10-CM | POA: Diagnosis not present

## 2019-06-05 DIAGNOSIS — Z87891 Personal history of nicotine dependence: Secondary | ICD-10-CM | POA: Diagnosis not present

## 2019-06-05 DIAGNOSIS — G20 Parkinson's disease: Secondary | ICD-10-CM | POA: Diagnosis not present

## 2019-06-05 DIAGNOSIS — I951 Orthostatic hypotension: Secondary | ICD-10-CM | POA: Diagnosis not present

## 2019-06-05 DIAGNOSIS — N3000 Acute cystitis without hematuria: Secondary | ICD-10-CM | POA: Diagnosis not present

## 2019-06-05 DIAGNOSIS — R4182 Altered mental status, unspecified: Secondary | ICD-10-CM | POA: Diagnosis not present

## 2019-06-05 LAB — CBC WITH DIFFERENTIAL/PLATELET
Abs Immature Granulocytes: 0.06 10*3/uL (ref 0.00–0.07)
Basophils Absolute: 0 10*3/uL (ref 0.0–0.1)
Basophils Relative: 0 %
Eosinophils Absolute: 0.1 10*3/uL (ref 0.0–0.5)
Eosinophils Relative: 1 %
HCT: 33.5 % — ABNORMAL LOW (ref 39.0–52.0)
Hemoglobin: 11 g/dL — ABNORMAL LOW (ref 13.0–17.0)
Immature Granulocytes: 1 %
Lymphocytes Relative: 22 %
Lymphs Abs: 2.6 10*3/uL (ref 0.7–4.0)
MCH: 26.8 pg (ref 26.0–34.0)
MCHC: 32.8 g/dL (ref 30.0–36.0)
MCV: 81.5 fL (ref 80.0–100.0)
Monocytes Absolute: 1.1 10*3/uL — ABNORMAL HIGH (ref 0.1–1.0)
Monocytes Relative: 10 %
Neutro Abs: 7.8 10*3/uL — ABNORMAL HIGH (ref 1.7–7.7)
Neutrophils Relative %: 66 %
Platelets: 372 10*3/uL (ref 150–400)
RBC: 4.11 MIL/uL — ABNORMAL LOW (ref 4.22–5.81)
RDW: 14.5 % (ref 11.5–15.5)
WBC: 11.6 10*3/uL — ABNORMAL HIGH (ref 4.0–10.5)
nRBC: 0 % (ref 0.0–0.2)

## 2019-06-05 LAB — COMPREHENSIVE METABOLIC PANEL
ALT: 25 U/L (ref 0–44)
AST: 27 U/L (ref 15–41)
Albumin: 3.5 g/dL (ref 3.5–5.0)
Alkaline Phosphatase: 51 U/L (ref 38–126)
Anion gap: 9 (ref 5–15)
BUN: 18 mg/dL (ref 8–23)
CO2: 23 mmol/L (ref 22–32)
Calcium: 9.2 mg/dL (ref 8.9–10.3)
Chloride: 107 mmol/L (ref 98–111)
Creatinine, Ser: 1.24 mg/dL (ref 0.61–1.24)
GFR calc Af Amer: >60 mL/min (ref >60)
GFR calc non Af Amer: 57 mL/min — ABNORMAL LOW (ref >60)
Glucose, Bld: 160 mg/dL — ABNORMAL HIGH (ref 70–99)
Potassium: 4 mmol/L (ref 3.5–5.1)
Sodium: 139 mmol/L (ref 135–145)
Total Bilirubin: 0.8 mg/dL (ref 0.3–1.2)
Total Protein: 7.7 g/dL (ref 6.5–8.1)

## 2019-06-05 LAB — URINALYSIS, COMPLETE (UACMP) WITH MICROSCOPIC
Bacteria, UA: NONE SEEN
Bilirubin Urine: NEGATIVE
Glucose, UA: NEGATIVE mg/dL
Ketones, ur: NEGATIVE mg/dL
Leukocytes,Ua: NEGATIVE
Nitrite: NEGATIVE
Protein, ur: NEGATIVE mg/dL
Specific Gravity, Urine: 1.011 (ref 1.005–1.030)
pH: 7 (ref 5.0–8.0)
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
## 2019-06-05 NOTE — ED Notes (Signed)
Patient alert. Able to transfer from wheelchair to stretcher with staff assist. Able to follow commands.

## 2019-06-05 NOTE — ED Triage Notes (Signed)
Pt arrives to ED from [facility]. EMS reports VSS and CBG 157. EMS reports that staff had a hard time being woken up this AM. Possibly syncopal episode. Was in recliner and then was moved to bed and woke up. Pt is alert and answering questions appropriately.

## 2019-06-05 NOTE — ED Notes (Signed)
Patient returned to 11 hallway due to unresponsiveness reports by triage staff. Williams MD aware and at bedside.

## 2019-06-05 NOTE — Patient Outreach (Signed)
[facility]) Care Management  06/05/2019  Brandon Manning 10-29-1945 [id number]     Transition of Care Referral  Referral Date: 06/05/2019 Referral Source: [facility] Discharge Report Date of Admission: 05/29/2019 Diagnosis:"acute metabolic encephalopathy" Date of Discharge: 06/04/2019 Facility: [place]: [employer] *PCP Office Does TOC*    Referral received. Upon chart review, noted that patient discharged from hospital to SNF/rehab.     Plan: RN CM will close case at this time.   Enzo Montgomery, RN,BSN,CCM [place] Management Telephonic Care Management Coordinator Direct Phone: [phone] Toll Free: [phone] Fax: [phone]

## 2019-06-05 NOTE — ED Notes (Signed)
Non emergent EMS not needed at this time. Spoke with patients spouse who reports she will call family to come pick him up and transport him back to [facility].

## 2019-06-16 IMAGING — CT CT RENAL STONE PROTOCOL
2 of 4 series · 16 of 46 positions shown, 18 images · non-contrast
Comparison: None.

CLINICAL DATA: Pt was going into [REDACTED] and passed out -
lowered to the ground. Lightheaded x 2 days.

EXAM:
CT ABDOMEN AND PELVIS WITHOUT CONTRAST
TECHNIQUE: Multidetector CT imaging of the abdomen and pelvis was performed
following the standard protocol without IV contrast.

[Series 2: stone full standard · axial · 0.67mm/px · z∈[-484,-44]mm · 13 of 98 slices shown, 15 images]
[im 5/98  soft-tissue]
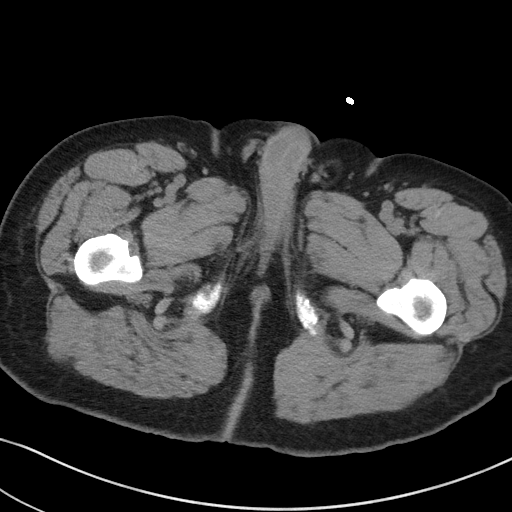
[im 5/98  bone]
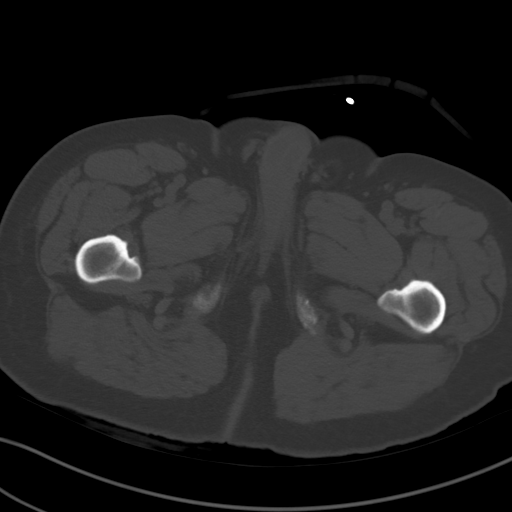
[im 13/98  soft-tissue]
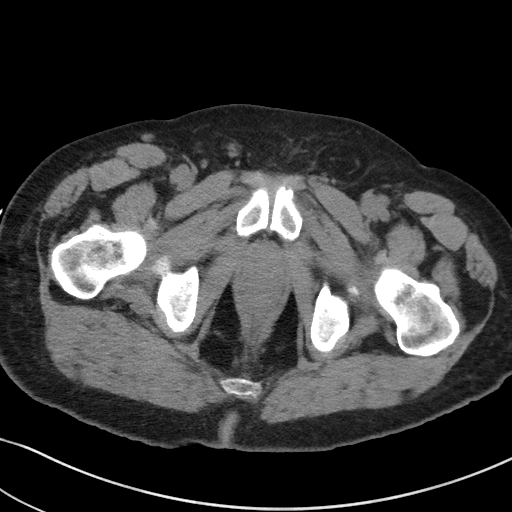
[im 22/98  soft-tissue]
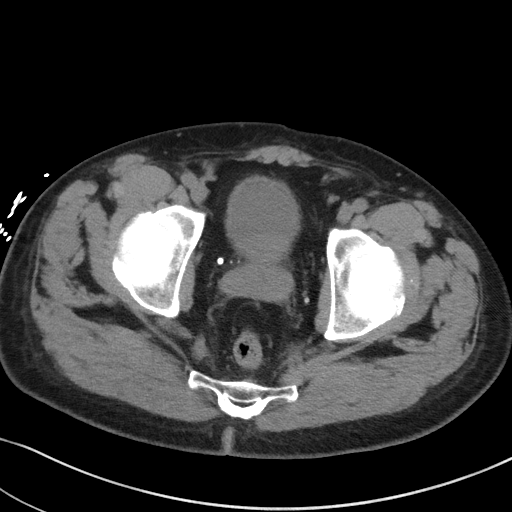
[im 26/98  soft-tissue]
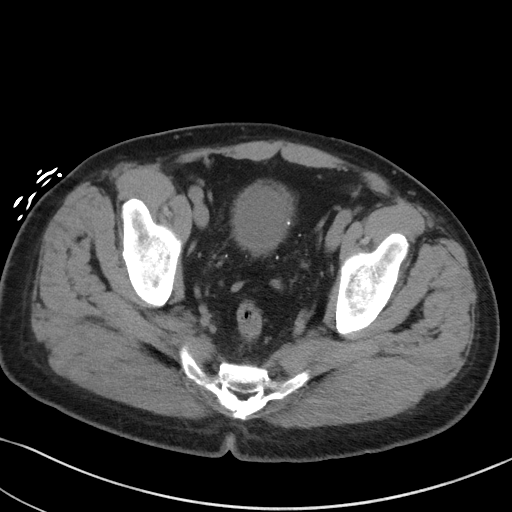
[im 34/98  soft-tissue]
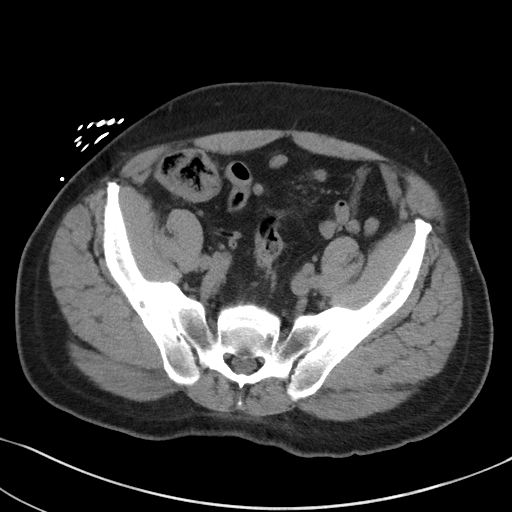
[im 43/98  soft-tissue]
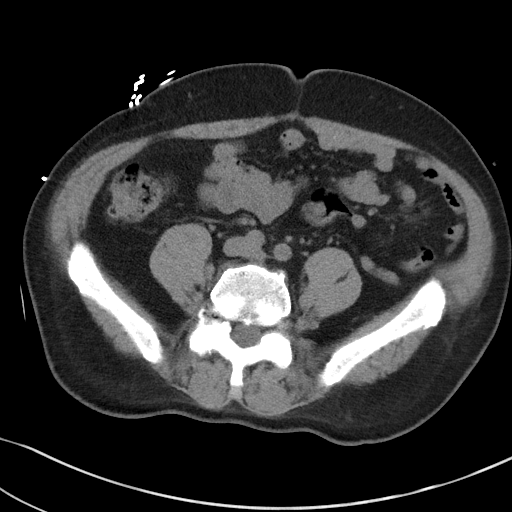
[im 51/98  soft-tissue]
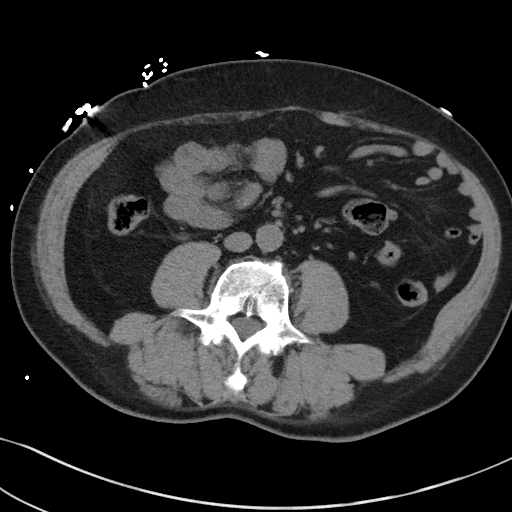
[im 55/98  soft-tissue]
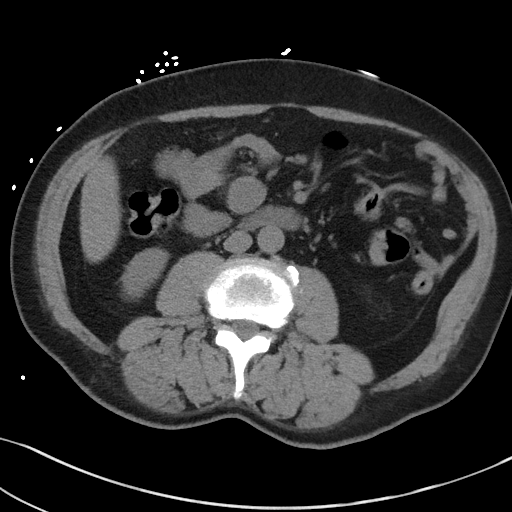
[im 64/98  soft-tissue]
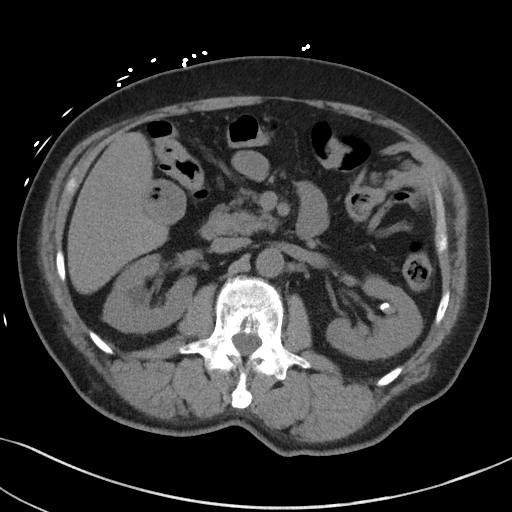
[im 64/98  bone]
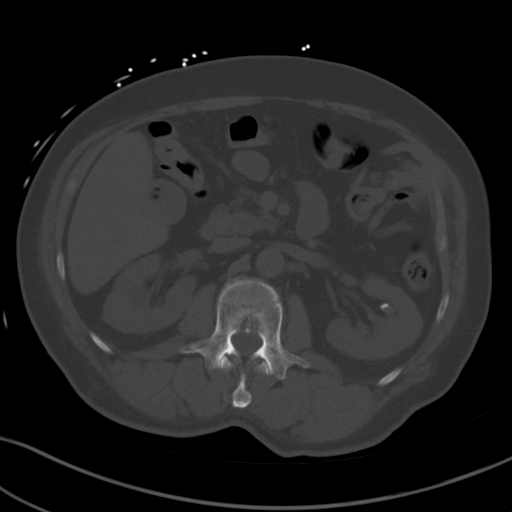
[im 72/98  soft-tissue]
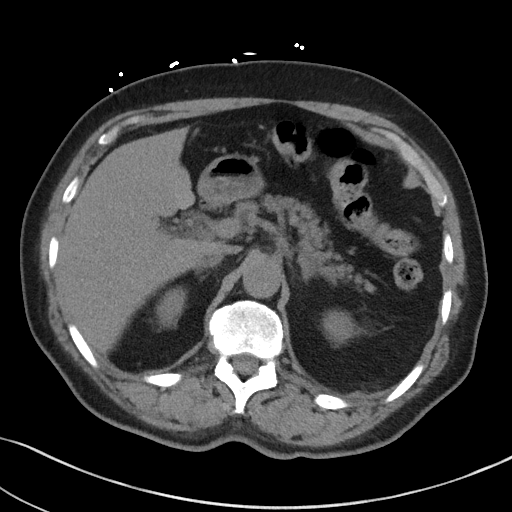
[im 76/98  soft-tissue]
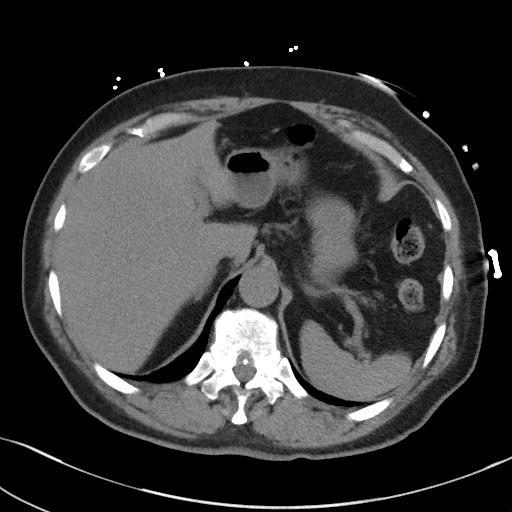
[im 85/98  soft-tissue]
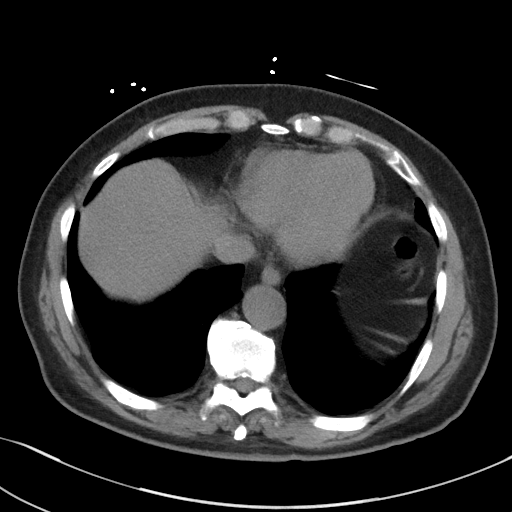
[im 93/98  soft-tissue]
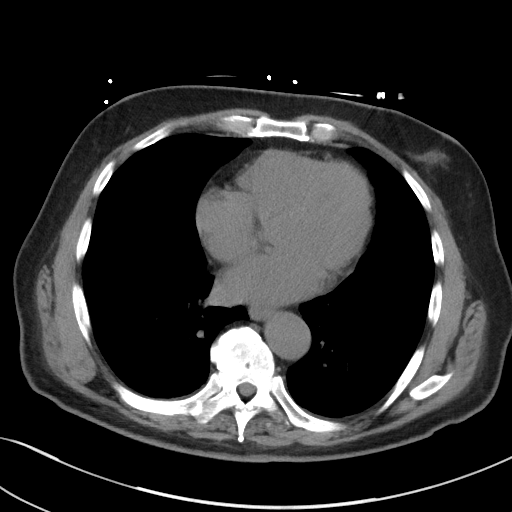

[Series 5: coronal · coronal · 0.79mm/px · 3 of 126 slices shown]
[im 42/126  soft-tissue]
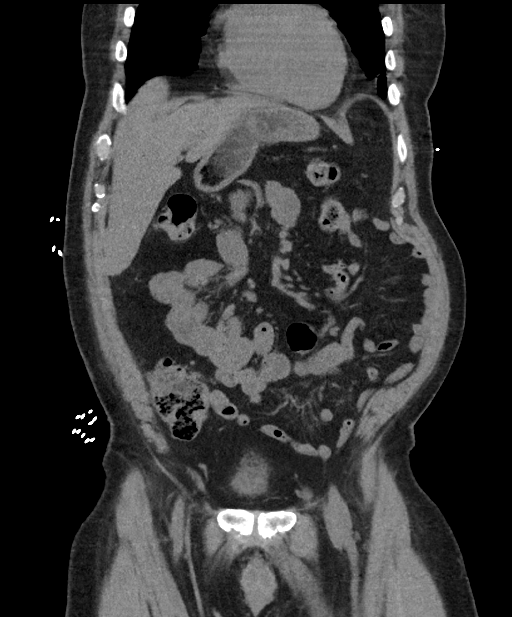
[im 56/126  soft-tissue]
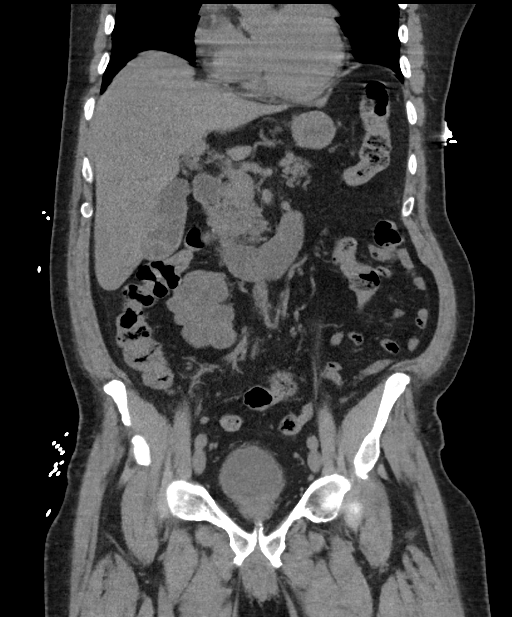
[im 70/126  soft-tissue]
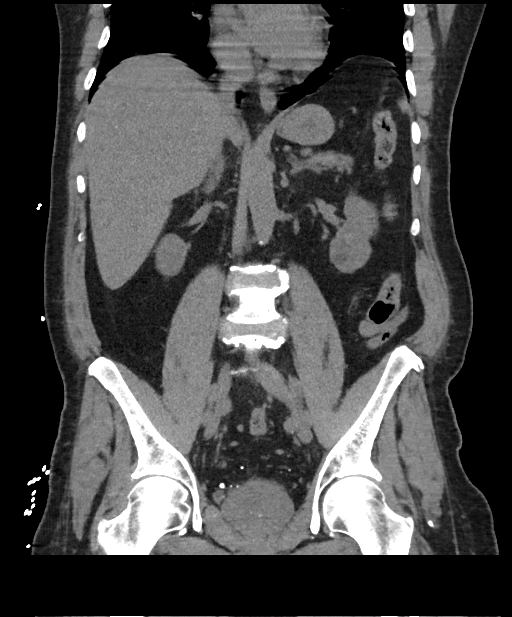

[16 of 46 positions shown; findings below may reference images not displayed]

FINDINGS: Lower chest: No acute abnormality.

Hepatobiliary: Multiple stones within the nondistended gallbladder.
No evidence of acute cholecystitis. No focal abnormality within the
liver. No bile duct dilatation.

Pancreas: Partially infiltrated with fat but otherwise unremarkable.

Spleen: Normal in size without focal abnormality.

Adrenals/Urinary Tract: Adrenal glands are unremarkable. 5 mm
nonobstructing left renal stone. Right renal cyst. No hydronephrosis
or perinephric edema bilaterally. No ureteral or bladder calculi
identified.

Bladder walls appear thickened, with inflammation, although
difficult to evaluate due to the bladder decompression.

Stomach/Bowel: Bowel is normal in caliber. No bowel wall thickening
or evidence of bowel wall inflammation. Scattered mild
diverticulosis without evidence of acute diverticulitis. Appendix is
normal. Stomach is unremarkable, decompressed.

Vascular/Lymphatic: No significant vascular findings are present. No
enlarged abdominal or pelvic lymph nodes.

Reproductive: Prostate gland is enlarged.

Other: No free fluid or abscess collection. No free intraperitoneal
air.

Musculoskeletal: Scattered degenerative changes within the thoracic
and lumbar spine, mild to moderate in degree. No acute or suspicious
osseous finding. Bilateral inguinal hernias without bowel
involvement.
IMPRESSION: 1. Bladder walls appear somewhat thickened/edematous, but difficult
to characterize due to the bladder decompression. Consider
correlation with urinalysis to exclude associated cystitis.
Alternatively, findings could be related to neurogenic bladder given
the underlying prostate gland enlargement.
2. Prostate gland is prominently enlarged, measuring 5.8 x 5.6 cm,
causing mass effect on the bladder base. Consider correlation with
physical exam findings and/or PSA lab values.
3. Cholelithiasis without evidence of acute cholecystitis.
4. Left nephrolithiasis without hydronephrosis.
5. Mild colonic diverticulosis without evidence of acute
diverticulitis.
6. No acute findings.

## 2019-06-16 IMAGING — CR DG CHEST 2V
2 series · 2 of 2 positions shown · non-contrast
Comparison: 08/05/2009

CLINICAL DATA: Syncopal episode.

EXAM:
CHEST  2 VIEW

[chest pa]
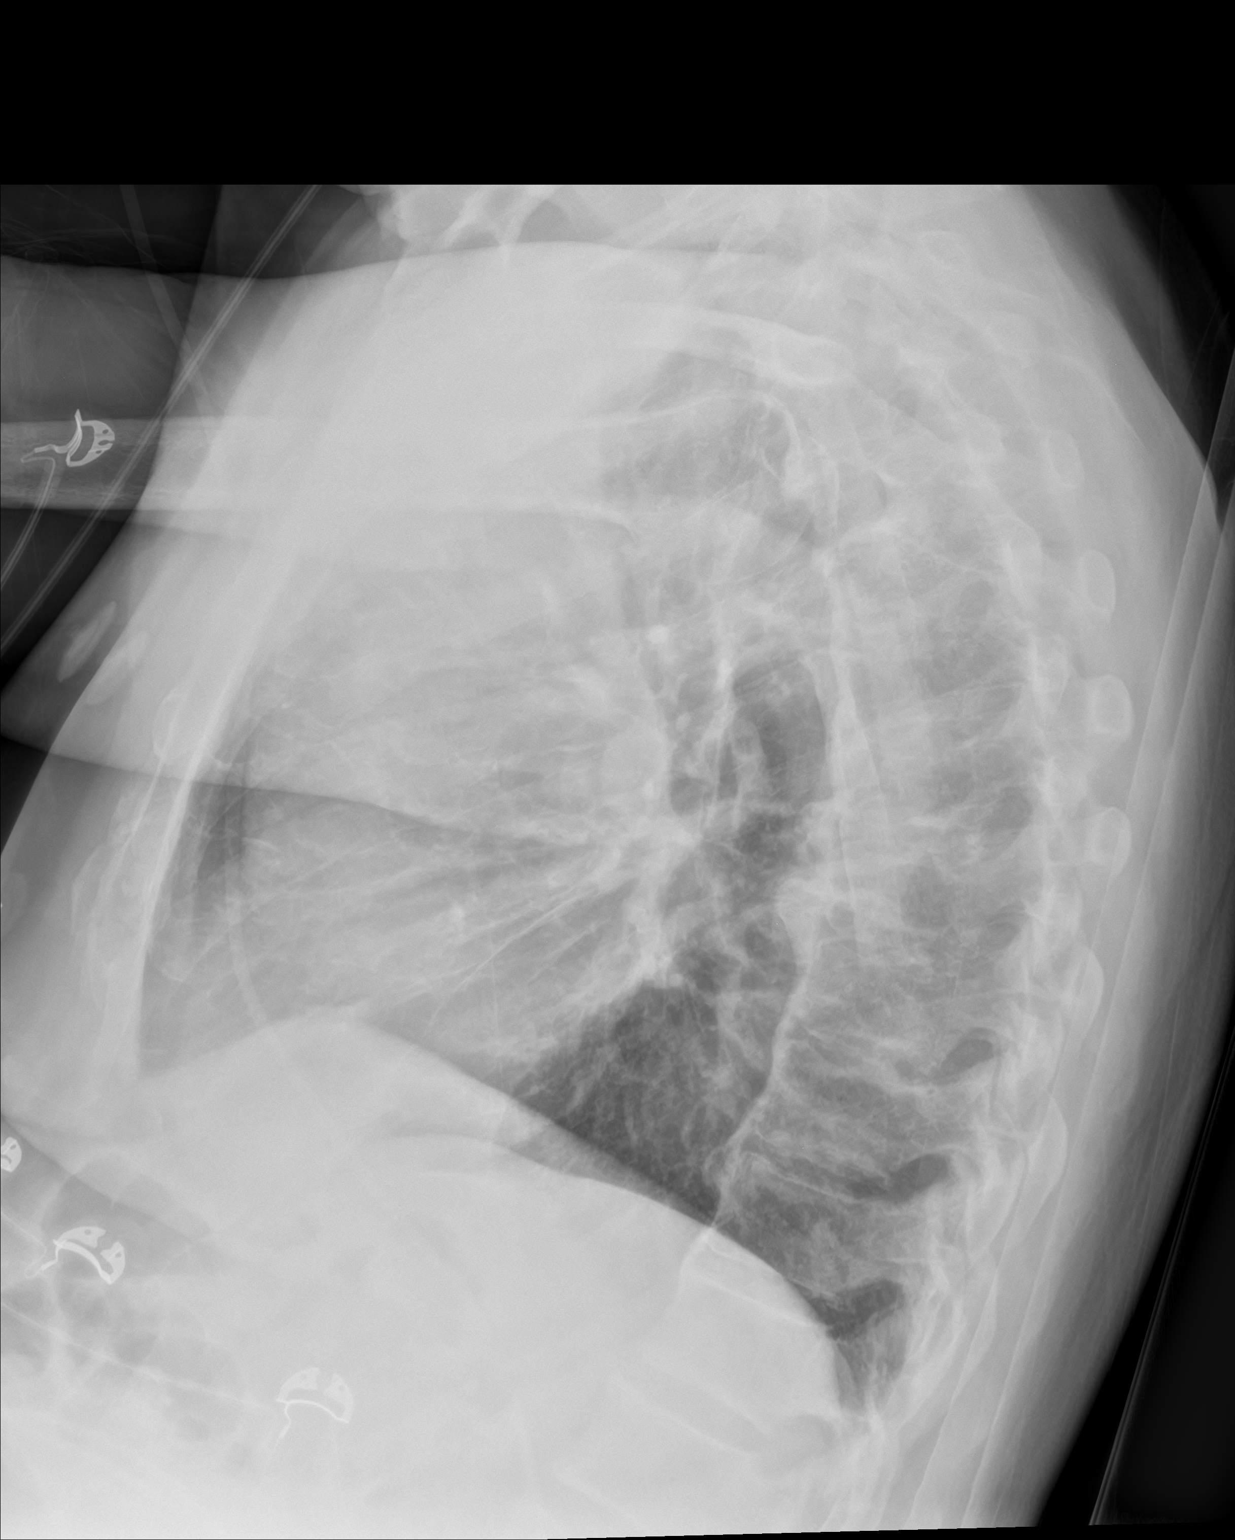

[chest ap]
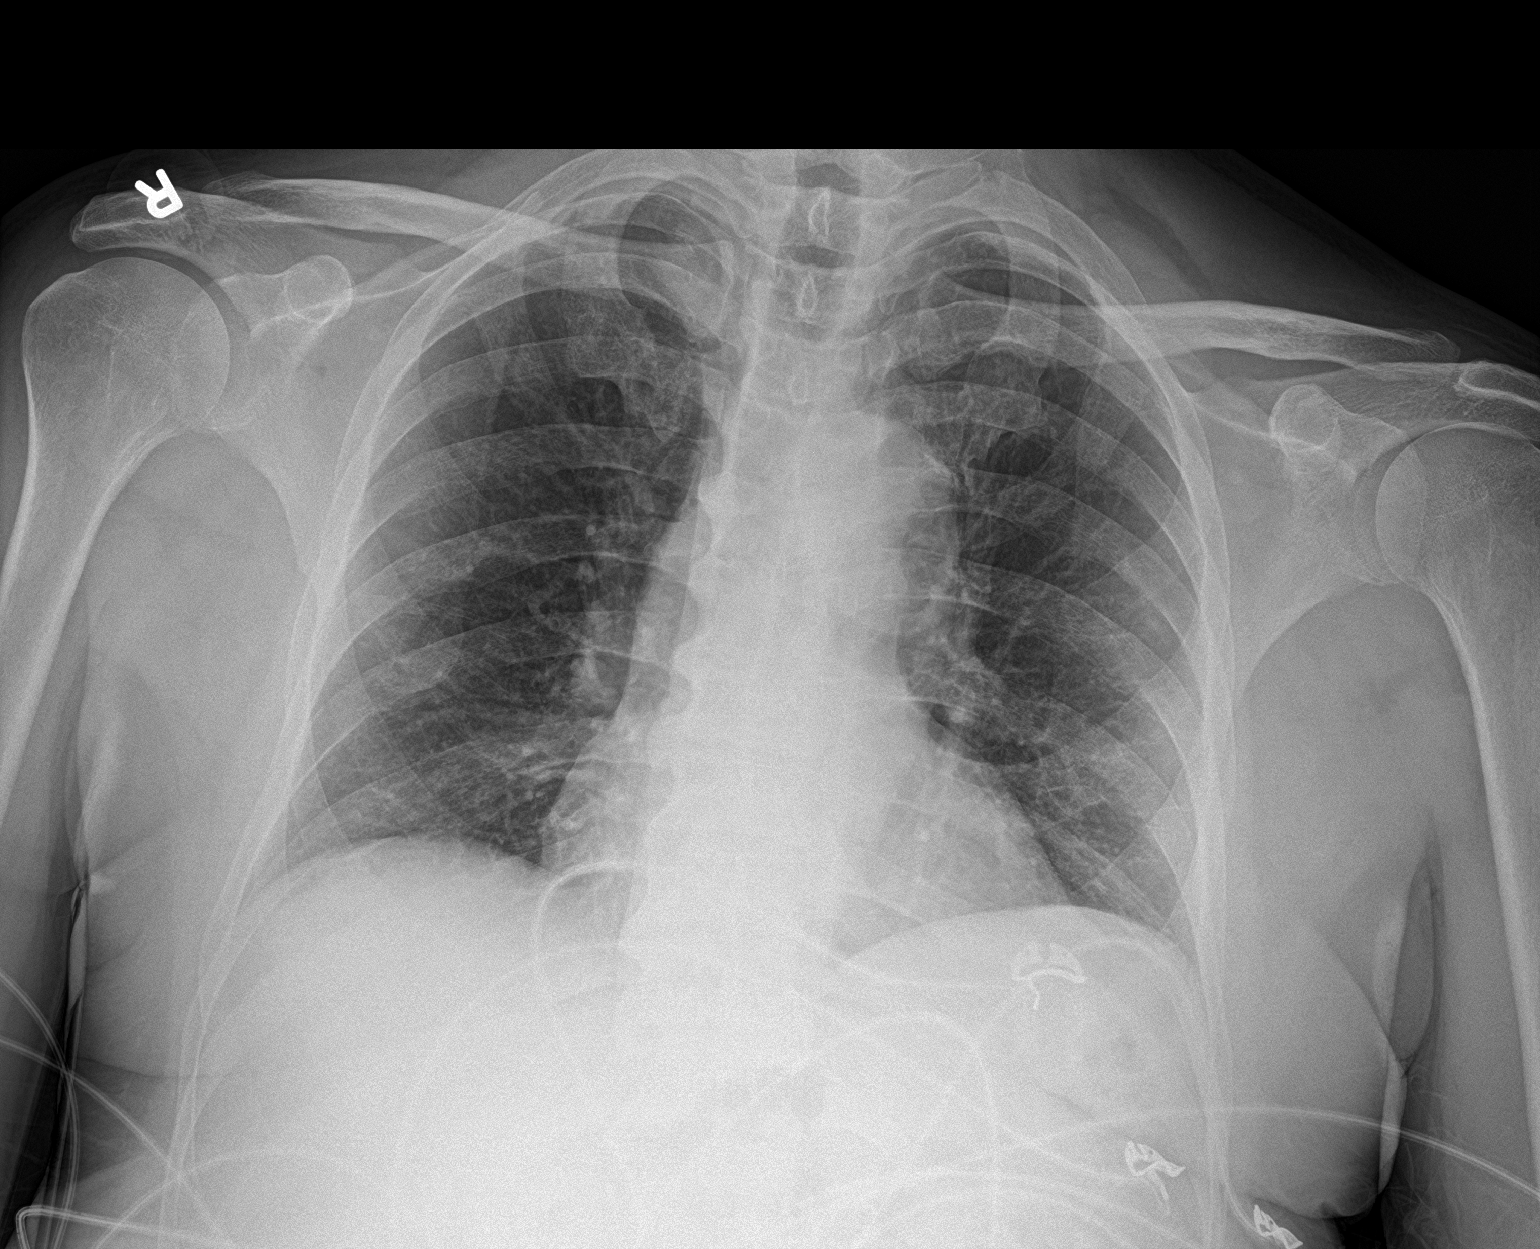

[2 of 2 positions shown; findings below may reference images not displayed]

FINDINGS: Heart size is normal. Chronic aortic atherosclerosis. The pulmonary
vascularity is normal. The lungs are clear. No effusions. Ordinary
chronic degenerative changes affect the spine.
IMPRESSION: No active disease.

## 2019-06-19 DIAGNOSIS — N401 Enlarged prostate with lower urinary tract symptoms: Secondary | ICD-10-CM | POA: Diagnosis not present

## 2019-06-19 DIAGNOSIS — G909 Disorder of the autonomic nervous system, unspecified: Secondary | ICD-10-CM | POA: Diagnosis not present

## 2019-06-19 DIAGNOSIS — E559 Vitamin D deficiency, unspecified: Secondary | ICD-10-CM | POA: Diagnosis not present

## 2019-06-19 DIAGNOSIS — N3941 Urge incontinence: Secondary | ICD-10-CM | POA: Diagnosis not present

## 2019-06-19 DIAGNOSIS — E538 Deficiency of other specified B group vitamins: Secondary | ICD-10-CM | POA: Diagnosis not present

## 2019-06-19 DIAGNOSIS — G20 Parkinson's disease: Secondary | ICD-10-CM | POA: Diagnosis not present

## 2019-06-19 DIAGNOSIS — I159 Secondary hypertension, unspecified: Secondary | ICD-10-CM | POA: Diagnosis not present

## 2019-07-17 ENCOUNTER — Telehealth: Payer: Self-pay

## 2019-07-17 NOTE — Telephone Encounter (Signed)
Spoke with pt's wife, [name], asking about meds and f/u appt.  States pt was started on amlodipine and metoprolol at in rehab at [facility] and needs refills.  He was d/c on 07/06/19.  Scheduled a phn visit tomorrow with Dr. Darnell Level for rehab f/u.   Fyi, pt has a few other follow ups scheduled on 07/30/19, 119/21 and is currently waiting to set up OT/PT.

## 2019-07-17 NOTE — Telephone Encounter (Signed)
Brandon Manning (DPR signed) left v/m pt needs refills on  Amlodipine 2.5 mg taking one daily (last filled # 30 on 05/24/19 by Ava Swayze DO) and metoprolol tartrate 25 mg taking one tab bid (last filled # 60 x 1 on 06/04/19 by Dr Irine Seal), pt seen 06/05/19 at [facility] ED and do not see where pt did FU ED visit. I returned Deborah's call and someone picked up but would not respond. I called back x 2 and phone has remained busy.[email or web address] [place].

## 2019-07-18 MED ORDER — AMLODIPINE BESYLATE 2.5 MG PO TABS: 2.5000 mg | ORAL_TABLET | Freq: Every day | ORAL | 0 refills | Status: DC

## 2019-07-18 MED ORDER — METOPROLOL TARTRATE 50 MG PO TABS: 50.0000 mg | ORAL_TABLET | Freq: Two times a day (BID) | ORAL | 0 refills | Status: DC

## 2019-07-18 NOTE — Addendum Note (Signed)
Addended by: [name] on: 07/18/2019 02:50 PM   Modules accepted: Orders

## 2019-07-18 NOTE — Telephone Encounter (Signed)
Will see then. 

## 2019-07-19 ENCOUNTER — Telehealth: Payer: Self-pay | Admitting: Family Medicine

## 2019-07-19 ENCOUNTER — Encounter: Payer: Self-pay | Admitting: Family Medicine

## 2019-07-19 ENCOUNTER — Ambulatory Visit (INDEPENDENT_AMBULATORY_CARE_PROVIDER_SITE_OTHER): Payer: Medicare HMO | Admitting: Family Medicine

## 2019-07-19 ENCOUNTER — Other Ambulatory Visit: Payer: Self-pay

## 2019-07-19 DIAGNOSIS — I10 Essential (primary) hypertension: Secondary | ICD-10-CM | POA: Diagnosis not present

## 2019-07-19 DIAGNOSIS — N401 Enlarged prostate with lower urinary tract symptoms: Secondary | ICD-10-CM

## 2019-07-19 DIAGNOSIS — M545 Low back pain: Secondary | ICD-10-CM | POA: Diagnosis not present

## 2019-07-19 DIAGNOSIS — N390 Urinary tract infection, site not specified: Secondary | ICD-10-CM

## 2019-07-19 DIAGNOSIS — G8929 Other chronic pain: Secondary | ICD-10-CM | POA: Diagnosis not present

## 2019-07-19 DIAGNOSIS — R351 Nocturia: Secondary | ICD-10-CM | POA: Diagnosis not present

## 2019-07-19 DIAGNOSIS — I951 Orthostatic hypotension: Secondary | ICD-10-CM | POA: Diagnosis not present

## 2019-07-19 DIAGNOSIS — G20 Parkinson's disease: Secondary | ICD-10-CM | POA: Diagnosis not present

## 2019-07-19 MED ORDER — METOPROLOL TARTRATE 25 MG PO TABS: 25.0000 mg | ORAL_TABLET | Freq: Two times a day (BID) | ORAL | 3 refills | Status: DC

## 2019-07-19 MED ORDER — AMLODIPINE BESYLATE 2.5 MG PO TABS: 2.5000 mg | ORAL_TABLET | Freq: Every day | ORAL | 3 refills | Status: DC

## 2019-07-19 NOTE — Telephone Encounter (Signed)
Spoke with pt's wife, Brandon Manning.  States the agency is  [facility].  Also, Minna Merritts, PT, is on 07/23/19 for assessment.  Wellness scheduled on 10/31/19 and labs on 10/24/19.

## 2019-07-19 NOTE — Telephone Encounter (Signed)
Patient's Wife called.  She has information that she is needing to give you that Dr [name] requested   She would like a phone call back before 12:30 if possible, she has a doctors appt and will not be home after that time     Also scheduled AWV in April for patient

## 2019-07-19 NOTE — Progress Notes (Signed)
Brandon Manning - 73 y.o. male  MRN [id number]  Date of Birth: Jul 01, 1946  PCP: [name], MD  This service was provided via telemedicine. Phone Visit performed on 07/19/2019    Rationale for phone visit along with limitations reviewed. Patient consented to telephone encounter.    Location of patient: home Location of provider: in office, [place] [facility] Name of referring provider: N/A   Names of persons and role in encounter: Provider: [name], MD  Patient: Brandon Manning  Other: wife Brandon Manning also present on call - gives history    Time on call: 9:59am - 10:15am   Subjective: Chief Complaint  Patient presents with  . Hospitalization Follow-up     HPI:  Recent hospitalization for AMS with weakness and unresponsiveness at SNF. Found to have metabolic encephalopathy presumed due to UTI, treated with IV antibiotics and IVF. Rocephin transitioned to oral omnicef. TED hose use was recommended.   Seen at ER 06/05/2019 for AMS with reassuring evaluation.   Came home from [facility] 07/06/2019. Now on amlodipine 2.5mg  daily, metoprolol 25mg  bid. Since home, doing better. Mobility decreased after recent hospitalizations but overall slowly improving.  Appetite improving. Using walker and cane for ambulation assistance.   Spoke with [name] PT to come out next week for evaluation. Nurse aide to come out to the house as well.   They are also looking into hiring personal care aide after [facility] ends.   Upcoming cardiology appt next month (Dr End).   Admit date: 05/29/2019 Discharge date: 06/04/2019 Discharged home from SNF: 07/06/2019 No TCM hosp f/u phone call performed.   Recommendations for Outpatient Follow-up:  1. Follow-up with MD at skilled nursing facility.  Patient will need a basic metabolic profile done in 1 week to follow-up on electrolytes and renal function.  Discharge Diagnoses:  Principal Problem:   Acute metabolic  encephalopathy Active Problems:   Parkinson disease ([place])   Autonomic dysfunction   UTI (urinary tract infection)   AKI (acute kidney injury) ([place])   Urinary tract infection in elderly patient  Discharge Condition: Stable and improved  Diet recommendation: Heart healthy   Objective/Observations:  No physical exam or vital signs collected unless specifically identified below.   BP 120/70   Pulse 84   Temp 98 F (36.7 C)   Ht 6' (1.829 m)   Wt 184 lb (83.5 kg)   BMI 24.95 kg/m   BPs checked while laying down this morning. Wife states BP drops when he is ambulatory - 120-130/60-70.   Wt Readings from Last 3 Encounters:  07/19/19 184 lb (83.5 kg)  06/05/19 185 lb 9.6 oz (84.2 kg)  06/04/19 178 lb 11.2 oz (81.1 kg)    Respiratory status: speaks in complete sentences without evident shortness of breath.   Assessment/Plan:  HTN (hypertension) BP elevated when checked supine - drops when ambulatory. Need to treat upright BP to prevent orthostatic syncope in parkinson's disease. Now off midodrine. Low dose amlodipine and metoprolol refilled.   BPH (benign prostatic hyperplasia) Now off finasteride - will need to monitor for BPH symptoms.  Avoid alpha1 blockers due to orthostasis.  Parkinson disease [facility]) Upcoming neurology appt next month. Appreciate Dr Doristine Devoid care.  Now off midodrine.   Bilateral low back pain without sciatica Stable period, now off hydrocodone. Will start schedule tylenol if needed.   Orthostasis Will encourage continued support hose use.   Orthostatic hypotension Due to autonomic dysfunction. Treat upright blood pressure.   Urinary  tract infection in elderly patient Completed treatment, symptoms have resolved.    I discussed the assessment and treatment plan with the patient. The patient was provided an opportunity to ask questions and all were answered. The patient agreed with the plan and demonstrated an understanding of the  instructions.  Lab Orders  No laboratory test(s) ordered today    Meds ordered this encounter  Medications  . metoprolol tartrate (LOPRESSOR) 25 MG tablet    Sig: Take 1 tablet (25 mg total) by mouth 2 (two) times daily.    Dispense:  180 tablet    Refill:  3  . amLODipine (NORVASC) 2.5 MG tablet    Sig: Take 1 tablet (2.5 mg total) by mouth daily.    Dispense:  90 tablet    Refill:  3    The patient was advised to call back or seek an in-person evaluation if the symptoms worsen or if the condition fails to improve as anticipated.  [name], MD

## 2019-07-21 NOTE — Assessment & Plan Note (Signed)
Upcoming neurology appt next month. Appreciate Dr Doristine Devoid care.  Now off midodrine.

## 2019-07-21 NOTE — Assessment & Plan Note (Signed)
Completed treatment, symptoms have resolved.

## 2019-07-21 NOTE — Telephone Encounter (Signed)
Noted. Thanks.

## 2019-07-21 NOTE — Assessment & Plan Note (Addendum)
BP elevated when checked supine - drops when ambulatory. Need to treat upright BP to prevent orthostatic syncope in parkinson's disease. Now off midodrine. Low dose amlodipine and metoprolol refilled.

## 2019-07-21 NOTE — Assessment & Plan Note (Addendum)
Now off finasteride - will need to monitor for BPH symptoms.  Avoid alpha1 blockers due to orthostasis.

## 2019-07-21 NOTE — Assessment & Plan Note (Signed)
Due to autonomic dysfunction. Treat upright blood pressure.

## 2019-07-21 NOTE — Assessment & Plan Note (Signed)
Will encourage continued support hose use.

## 2019-07-21 NOTE — Assessment & Plan Note (Addendum)
Stable period, now off hydrocodone. Will start schedule tylenol if needed.

## 2019-07-23 ENCOUNTER — Telehealth: Payer: Self-pay | Admitting: *Deleted

## 2019-07-23 DIAGNOSIS — E559 Vitamin D deficiency, unspecified: Secondary | ICD-10-CM | POA: Diagnosis not present

## 2019-07-23 DIAGNOSIS — Z87440 Personal history of urinary (tract) infections: Secondary | ICD-10-CM | POA: Diagnosis not present

## 2019-07-23 DIAGNOSIS — M545 Low back pain: Secondary | ICD-10-CM | POA: Diagnosis not present

## 2019-07-23 DIAGNOSIS — G20 Parkinson's disease: Secondary | ICD-10-CM | POA: Diagnosis not present

## 2019-07-23 DIAGNOSIS — I499 Cardiac arrhythmia, unspecified: Secondary | ICD-10-CM | POA: Diagnosis not present

## 2019-07-23 DIAGNOSIS — I10 Essential (primary) hypertension: Secondary | ICD-10-CM | POA: Diagnosis not present

## 2019-07-23 DIAGNOSIS — I959 Hypotension, unspecified: Secondary | ICD-10-CM | POA: Diagnosis not present

## 2019-07-23 DIAGNOSIS — N400 Enlarged prostate without lower urinary tract symptoms: Secondary | ICD-10-CM | POA: Diagnosis not present

## 2019-07-23 DIAGNOSIS — Z9181 History of falling: Secondary | ICD-10-CM | POA: Diagnosis not present

## 2019-07-23 NOTE — Telephone Encounter (Signed)
Agree with this. Thank you.  

## 2019-07-23 NOTE — Telephone Encounter (Signed)
Brandon Manning PT with Brandon Manning left a voicemail requesting verbal orders for PT twice a week for 3 weeks, once a week for 3 weeks Brandon Manning is also requesting home health aid for grooming and bathing for twice a week for 3 weeks and once a week for 3 weeks.

## 2019-07-24 NOTE — Telephone Encounter (Signed)
Left detailed message on verified machine with verbal orders for PT and [facility] Aid services.   Nothing further needed.

## 2019-07-26 DIAGNOSIS — E559 Vitamin D deficiency, unspecified: Secondary | ICD-10-CM

## 2019-07-26 DIAGNOSIS — Z9181 History of falling: Secondary | ICD-10-CM

## 2019-07-26 DIAGNOSIS — N400 Enlarged prostate without lower urinary tract symptoms: Secondary | ICD-10-CM

## 2019-07-26 DIAGNOSIS — I499 Cardiac arrhythmia, unspecified: Secondary | ICD-10-CM | POA: Diagnosis not present

## 2019-07-26 DIAGNOSIS — Z87440 Personal history of urinary (tract) infections: Secondary | ICD-10-CM

## 2019-07-26 DIAGNOSIS — I10 Essential (primary) hypertension: Secondary | ICD-10-CM | POA: Diagnosis not present

## 2019-07-26 DIAGNOSIS — I959 Hypotension, unspecified: Secondary | ICD-10-CM | POA: Diagnosis not present

## 2019-07-26 DIAGNOSIS — M545 Low back pain: Secondary | ICD-10-CM

## 2019-07-26 DIAGNOSIS — G20 Parkinson's disease: Secondary | ICD-10-CM | POA: Diagnosis not present

## 2019-07-30 ENCOUNTER — Ambulatory Visit (INDEPENDENT_AMBULATORY_CARE_PROVIDER_SITE_OTHER): Payer: Medicare HMO

## 2019-07-30 ENCOUNTER — Ambulatory Visit (INDEPENDENT_AMBULATORY_CARE_PROVIDER_SITE_OTHER): Payer: Medicare HMO | Admitting: Internal Medicine

## 2019-07-30 ENCOUNTER — Encounter: Payer: Self-pay | Admitting: Internal Medicine

## 2019-07-30 ENCOUNTER — Other Ambulatory Visit: Payer: Self-pay

## 2019-07-30 VITALS — BP 118/60 | HR 78 | Ht 70.0 in | Wt 184.0 lb

## 2019-07-30 DIAGNOSIS — R55 Syncope and collapse: Secondary | ICD-10-CM

## 2019-07-30 MED ORDER — METOPROLOL TARTRATE 25 MG PO TABS: 12.5000 mg | ORAL_TABLET | Freq: Two times a day (BID) | ORAL | 2 refills | Status: DC
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
## 2019-07-30 NOTE — Patient Instructions (Signed)
Medication Instructions:  Your physician has recommended you make the following change in your medication:  1- DECREASE Metoprolol to 12.5 mg (0.5 tablet) by mouth two times a day.  *If you need a refill on your cardiac medications before your next appointment, please call your pharmacy*  Lab Work: none If you have labs (blood work) drawn today and your tests are completely normal, you will receive your results only by: Marland Kitchen MyChart Message (if you have MyChart) OR . A paper copy in the mail If you have any lab test that is abnormal or we need to change your treatment, we will call you to review the results.  Testing/Procedures: Your physician has recommended that you wear an 14 DAY ZIO event monitor. Event monitors are medical devices that record the heart's electrical activity. Doctors most often [place] these monitors to diagnose arrhythmias. Arrhythmias are problems with the speed or rhythm of the heartbeat. The monitor is a small, portable device. You can wear one while you do your normal daily activities. This is usually used to diagnose what is causing palpitations/syncope (passing out). A Zio Patch Event Heart monitor will be applied to your chest today.  You will wear the patch for 14 days. After 24 hours, you may shower with the heart monitor on. If you feel any SYMPTOMS, you may press and release the button in the middle of the monitor.   Follow-Up: At [facility], you and your health needs are our priority.  As part of our continuing mission to provide you with exceptional heart care, we have created designated Provider Care Teams.  These Care Teams include your primary Cardiologist (physician) and Advanced Practice Providers (APPs -  Physician Assistants and Nurse Practitioners) who all work together to provide you with the care you need, when you need it.  Your next appointment:   4-6 week(s) with Dr End or APP.  The format for your next appointment:   In Person  Provider:     You may see DR Harrell Gave END or one of the following Advanced Practice Providers on your designated Care Team:    Murray Hodgkins, NP  Christell Faith, PA-C  Marrianne Mood, PA-C

## 2019-07-31 ENCOUNTER — Encounter: Payer: Self-pay | Admitting: Internal Medicine

## 2019-08-01 DIAGNOSIS — G20 Parkinson's disease: Secondary | ICD-10-CM | POA: Diagnosis not present

## 2019-08-01 DIAGNOSIS — Z87440 Personal history of urinary (tract) infections: Secondary | ICD-10-CM | POA: Diagnosis not present

## 2019-08-01 DIAGNOSIS — Z9181 History of falling: Secondary | ICD-10-CM | POA: Diagnosis not present

## 2019-08-01 DIAGNOSIS — I499 Cardiac arrhythmia, unspecified: Secondary | ICD-10-CM | POA: Diagnosis not present

## 2019-08-01 DIAGNOSIS — I959 Hypotension, unspecified: Secondary | ICD-10-CM | POA: Diagnosis not present

## 2019-08-01 DIAGNOSIS — I10 Essential (primary) hypertension: Secondary | ICD-10-CM | POA: Diagnosis not present

## 2019-08-01 DIAGNOSIS — N400 Enlarged prostate without lower urinary tract symptoms: Secondary | ICD-10-CM | POA: Diagnosis not present

## 2019-08-01 DIAGNOSIS — E559 Vitamin D deficiency, unspecified: Secondary | ICD-10-CM | POA: Diagnosis not present

## 2019-08-01 DIAGNOSIS — M545 Low back pain: Secondary | ICD-10-CM | POA: Diagnosis not present

## 2019-08-06 DIAGNOSIS — M545 Low back pain: Secondary | ICD-10-CM | POA: Diagnosis not present

## 2019-08-06 DIAGNOSIS — N400 Enlarged prostate without lower urinary tract symptoms: Secondary | ICD-10-CM | POA: Diagnosis not present

## 2019-08-06 DIAGNOSIS — I10 Essential (primary) hypertension: Secondary | ICD-10-CM | POA: Diagnosis not present

## 2019-08-06 DIAGNOSIS — E559 Vitamin D deficiency, unspecified: Secondary | ICD-10-CM | POA: Diagnosis not present

## 2019-08-06 DIAGNOSIS — I499 Cardiac arrhythmia, unspecified: Secondary | ICD-10-CM | POA: Diagnosis not present

## 2019-08-06 DIAGNOSIS — Z9181 History of falling: Secondary | ICD-10-CM | POA: Diagnosis not present

## 2019-08-06 DIAGNOSIS — G20 Parkinson's disease: Secondary | ICD-10-CM | POA: Diagnosis not present

## 2019-08-06 DIAGNOSIS — Z87440 Personal history of urinary (tract) infections: Secondary | ICD-10-CM | POA: Diagnosis not present

## 2019-08-06 DIAGNOSIS — I959 Hypotension, unspecified: Secondary | ICD-10-CM | POA: Diagnosis not present

## 2019-08-06 NOTE — Progress Notes (Signed)
Due to the COVID-19 crisis, this virtual check-in visit was done via telephone from my office and it was initiated and consent given by this patient and or family.   Telephone (Audio) Visit The purpose of this telephone visit is to provide medical care while limiting exposure to the novel coronavirus.    Consent was obtained for telephone visit and initiated by pt/family:  Yes.   Answered questions that patient had about telehealth interaction:  Yes.   I discussed the limitations, risks, security and privacy concerns of performing an evaluation and management service by telephone. I also discussed with the patient that there may be a patient responsible charge related to this service. The patient expressed understanding and agreed to proceed.  Pt location: Home Physician Location: office Name of referring provider:  [name], MD I connected with .Brandon Manning at patients initiation/request on 08/07/2019 at  3:30 PM EST by telephone and verified that I am speaking with the correct person using two identifiers.  Pt MRN:  [id number] Pt DOB:  June 07, 1946   History of Present Illness:  Patient seen today in follow-up.  Numerous records have been reviewed since our last visit, including hospital records, primary care records and my own records.  Patient was in the emergency room and subsequently admitted on October 25.  Patient presented with mental status change due to orthostatic hypotension.  While in the hospital, he had significant fluctuations in blood pressure.  Midodrine was stopped.  He was discharged on amlodipine and metoprolol.  He was discharged to subacute nursing facility.  He returned back on November 10, again with mental status change.  Patient was hypertensive in the emergency room.  Patient had significant leukocytosis (18.3).  He was determined to have urinary tract infection.  He was discharged on November 16 and was back in the emergency room on November 17.  Nursing  facility felt that perhaps he had a syncopal episode.  Emergency room work-up was negative and he was discharged back to the facility.  He followed up with primary care on January 2.  Noted that blood pressure was elevated when checked supine and dropped when ambulatory.  Patient saw cardiology on January 11.  Cardiology decreased metoprolol to 12.5 mg twice per day.  Pt currently on a heart monitor.  OT and PT have been out to the home and pt is improving.  Wife notes that when he first "jumps up" he will look like he wants to pass out but he doesn't.  Current Movement d/o meds: Carbidopa/levodopa 25/100, 2 tablets in the morning, 2 in the afternoon and 1 in the evening Midodrine discontinued after having blood pressure fluctuations   Observations/Objective:   Vitals:   08/07/19 1534  BP: 112/65  Pulse: 72  Temp: (!) 97.5 F (36.4 C)   Pt difficult to understand on the phone.  Repeats phrases that wife tells him to Assessment and Plan:   1.  Parkinsonism  -continue carbidopa/levodopa 25/100, 2/2/1 2.  PDD  -wife providing 24 hour/day care 3.  Neurogenic Orthostatic Hypotension  -Patient has had multiple syncopal episodes.  While in the hospital, the patient had extreme blood pressure fluctuations, and actually was started on 2 blood pressure medications and midodrine was discontinued.  Since that time, he actually ended up back in the emergency room with mental status change, but that was more because of urinary tract infection.  We will need to watch this close.  Cardiology has actually started to decrease his metoprolol.  My suspicion is that he will end up back on midodrine again, as he has had multiple syncopal episodes in the past from neurogenic orthostatic hypotension.  It will be important to treat the lows more aggressively than the highs.  Even today, wife reports BP in the 110's and pt with dizziness when arising.  Cardiology monitoring. 4.  Constipation  -Managing with diet 5.   B12 deficiency  -On oral supplementation   Follow Up Instructions:  80months  -I discussed the assessment and treatment plan with the patient. The patient was provided an opportunity to ask questions and all were answered. The patient agreed with the plan and demonstrated an understanding of the instructions.   The patient was advised to call back or seek an in-person evaluation if the symptoms worsen or if the condition fails to improve as anticipated.    Total Time spent in visit with the patient was:  13 min, of which 100% of the time was spent in counseling .   Pt understands and agrees with the plan of care outlined.  Time mentioned didn't include the 30 min of record that was independent of this time and summarized above.   Alonza Bogus, DO

## 2019-08-07 ENCOUNTER — Encounter: Payer: Self-pay | Admitting: Neurology

## 2019-08-07 ENCOUNTER — Telehealth (INDEPENDENT_AMBULATORY_CARE_PROVIDER_SITE_OTHER): Payer: Medicare HMO | Admitting: Neurology

## 2019-08-07 ENCOUNTER — Other Ambulatory Visit: Payer: Self-pay

## 2019-08-07 VITALS — BP 112/65 | HR 72 | Temp 97.5°F

## 2019-08-07 DIAGNOSIS — G20 Parkinson's disease: Secondary | ICD-10-CM | POA: Diagnosis not present

## 2019-08-07 DIAGNOSIS — F0280 Dementia in other diseases classified elsewhere without behavioral disturbance: Secondary | ICD-10-CM

## 2019-08-07 DIAGNOSIS — G903 Multi-system degeneration of the autonomic nervous system: Secondary | ICD-10-CM

## 2019-08-07 DIAGNOSIS — R55 Syncope and collapse: Secondary | ICD-10-CM | POA: Diagnosis not present

## 2019-08-09 ENCOUNTER — Other Ambulatory Visit: Payer: Self-pay

## 2019-08-09 ENCOUNTER — Ambulatory Visit: Payer: Medicare HMO | Admitting: Internal Medicine

## 2019-08-09 DIAGNOSIS — G20 Parkinson's disease: Secondary | ICD-10-CM | POA: Diagnosis not present

## 2019-08-09 DIAGNOSIS — Z9181 History of falling: Secondary | ICD-10-CM | POA: Diagnosis not present

## 2019-08-09 DIAGNOSIS — I959 Hypotension, unspecified: Secondary | ICD-10-CM | POA: Diagnosis not present

## 2019-08-09 DIAGNOSIS — N400 Enlarged prostate without lower urinary tract symptoms: Secondary | ICD-10-CM | POA: Diagnosis not present

## 2019-08-09 DIAGNOSIS — Z87440 Personal history of urinary (tract) infections: Secondary | ICD-10-CM | POA: Diagnosis not present

## 2019-08-09 DIAGNOSIS — I10 Essential (primary) hypertension: Secondary | ICD-10-CM | POA: Diagnosis not present

## 2019-08-09 DIAGNOSIS — M545 Low back pain: Secondary | ICD-10-CM | POA: Diagnosis not present

## 2019-08-09 DIAGNOSIS — E559 Vitamin D deficiency, unspecified: Secondary | ICD-10-CM | POA: Diagnosis not present

## 2019-08-09 DIAGNOSIS — I499 Cardiac arrhythmia, unspecified: Secondary | ICD-10-CM | POA: Diagnosis not present

## 2019-08-10 ENCOUNTER — Ambulatory Visit (INDEPENDENT_AMBULATORY_CARE_PROVIDER_SITE_OTHER): Payer: Medicare HMO | Admitting: Family Medicine

## 2019-08-10 ENCOUNTER — Encounter: Payer: Self-pay | Admitting: Family Medicine

## 2019-08-10 ENCOUNTER — Other Ambulatory Visit: Payer: Medicare HMO

## 2019-08-10 ENCOUNTER — Other Ambulatory Visit: Payer: Self-pay

## 2019-08-10 VITALS — BP 102/60 | HR 80 | Temp 97.3°F | Ht 70.0 in | Wt 184.0 lb

## 2019-08-10 DIAGNOSIS — R451 Restlessness and agitation: Secondary | ICD-10-CM

## 2019-08-10 DIAGNOSIS — R8290 Unspecified abnormal findings in urine: Secondary | ICD-10-CM

## 2019-08-10 DIAGNOSIS — R300 Dysuria: Secondary | ICD-10-CM | POA: Diagnosis not present

## 2019-08-10 DIAGNOSIS — G20 Parkinson's disease: Secondary | ICD-10-CM

## 2019-08-10 DIAGNOSIS — N3090 Cystitis, unspecified without hematuria: Secondary | ICD-10-CM

## 2019-08-10 LAB — POCT URINALYSIS DIPSTICK
Bilirubin, UA: NEGATIVE
Glucose, UA: NEGATIVE
Leukocytes, UA: NEGATIVE
Nitrite, UA: NEGATIVE
Protein, UA: POSITIVE — AB
Spec Grav, UA: 1.02 (ref 1.010–1.025)
Urobilinogen, UA: 1 E.U./dL
pH, UA: 6 (ref 5.0–8.0)

## 2019-08-10 NOTE — Progress Notes (Signed)
Virtual Visit via Telephone Note  I connected with Brandon Manning on 08/10/19 at  2:00 PM EST by telephone and verified that I am speaking with the correct person using two identifiers.  Location: Patient: In his home Provider: [place] Persons participating in virtual visit: Patient's wife Brandon Manning and provider   I discussed the limitations, risks, security and privacy concerns of performing an evaluation and management service by telephone and the availability of in person appointments. I also discussed with the patient that there may be a patient responsible charge related to this service. The patient expressed understanding and agreed to proceed.   History of Present Illness: Chief Complaint  Patient presents with  . Urinary Frequency    Confusion, discoloration of urine. U/A completed and culture collected. No fever.    This is a 74 year old male with end-stage Parkinson's disease.  This visit was conducted with his wife Brandon Manning.  She noticed 2 days ago that he seemed more agitated and had some urinary frequency.  This is been improved over the last 24 hours.  He was admitted in December with a urinary tract infection and she was concerned that it may have recurred.  She reports that he has not had any fever.  He is eating and drinking well.  He is not complaining of any pain.  They do have home health services.  The patient's wife reports that he has days of increased confusion, agitation and hallucinations.  She is wondering if there is something that could be given for this on an as-needed basis.  Observations/Objective: As this was a telephone visit and communication was between provider and patient's spouse, there is nothing that was observed directly with the patient BP 102/60 (BP Location: Right Arm, Patient Position: Sitting, Cuff Size: Normal)   Pulse 80   Temp (!) 97.3 F (36.3 C) (Temporal)   Ht 5\' 10"  (1.778 m)   Wt 184 lb (83.5 kg)   SpO2 99%   BMI 26.40  kg/m  Wt Readings from Last 3 Encounters:  08/10/19 184 lb (83.5 kg)  07/30/19 184 lb (83.5 kg)  07/19/19 184 lb (83.5 kg)   Results for orders placed or performed in visit on 08/10/19  Urinalysis Dipstick  Result Value Ref Range   Color, UA yellow orange    Clarity, UA little cloudy    Glucose, UA Negative Negative   Bilirubin, UA neg    Ketones, UA +-    Spec Grav, UA 1.020 1.010 - 1.025   Blood, UA 1+    pH, UA 6.0 5.0 - 8.0   Protein, UA Positive (A) Negative   Urobilinogen, UA 1.0 0.2 or 1.0 E.U./dL   Nitrite, UA neg    Leukocytes, UA Negative Negative   Appearance     Odor strong odor      Assessment and Plan: 1. Dysuria - Urinalysis Dipstick - Urine Culture  2. Abnormal urinalysis - no immediate findings that warrant antibiotic, will send for culture to rule out cystitis - Urinalysis Dipstick - Urine Culture  3. Parkinson disease (Brandon Manning) - currently with home health and home PT, patient's spouse reports that they are managing without difficulty  4. Restlessness and agitation - patient's spouse requests prn medication. Will route to patient's PCP    Clarene Reamer, FNP-BC  [facility]  08/10/2019 2:20 PM   Follow Up Instructions:    I discussed the assessment and treatment plan with the patient.  The patient was provided an opportunity to ask questions and all were answered. The patient agreed with the plan and demonstrated an understanding of the instructions.   The patient was advised to call back or seek an in-person evaluation if the symptoms worsen or if the condition fails to improve as anticipated.  I provided 8 minutes 12 seconds of non-face-to-face time during this encounter.   Elby Beck, FNP

## 2019-08-12 LAB — URINE CULTURE
MICRO NUMBER:: 10071126
SPECIMEN QUALITY:: ADEQUATE

## 2019-08-12 MED ORDER — SULFAMETHOXAZOLE-TRIMETHOPRIM 800-160 MG PO TABS: 1.0000 | ORAL_TABLET | Freq: Two times a day (BID) | ORAL | 0 refills | Status: AC

## 2019-08-12 NOTE — Addendum Note (Signed)
Addended by: Clarene Reamer B on: 08/12/2019 07:52 PM   Modules accepted: Orders

## 2019-08-14 DIAGNOSIS — N400 Enlarged prostate without lower urinary tract symptoms: Secondary | ICD-10-CM | POA: Diagnosis not present

## 2019-08-14 DIAGNOSIS — I499 Cardiac arrhythmia, unspecified: Secondary | ICD-10-CM | POA: Diagnosis not present

## 2019-08-14 DIAGNOSIS — M545 Low back pain: Secondary | ICD-10-CM | POA: Diagnosis not present

## 2019-08-14 DIAGNOSIS — Z87440 Personal history of urinary (tract) infections: Secondary | ICD-10-CM | POA: Diagnosis not present

## 2019-08-14 DIAGNOSIS — I959 Hypotension, unspecified: Secondary | ICD-10-CM | POA: Diagnosis not present

## 2019-08-14 DIAGNOSIS — G20 Parkinson's disease: Secondary | ICD-10-CM | POA: Diagnosis not present

## 2019-08-14 DIAGNOSIS — E559 Vitamin D deficiency, unspecified: Secondary | ICD-10-CM | POA: Diagnosis not present

## 2019-08-14 DIAGNOSIS — Z9181 History of falling: Secondary | ICD-10-CM | POA: Diagnosis not present

## 2019-08-14 DIAGNOSIS — I10 Essential (primary) hypertension: Secondary | ICD-10-CM | POA: Diagnosis not present

## 2019-08-15 DIAGNOSIS — I10 Essential (primary) hypertension: Secondary | ICD-10-CM | POA: Diagnosis not present

## 2019-08-15 DIAGNOSIS — Z9181 History of falling: Secondary | ICD-10-CM | POA: Diagnosis not present

## 2019-08-15 DIAGNOSIS — M545 Low back pain: Secondary | ICD-10-CM | POA: Diagnosis not present

## 2019-08-15 DIAGNOSIS — N400 Enlarged prostate without lower urinary tract symptoms: Secondary | ICD-10-CM | POA: Diagnosis not present

## 2019-08-15 DIAGNOSIS — Z87440 Personal history of urinary (tract) infections: Secondary | ICD-10-CM | POA: Diagnosis not present

## 2019-08-15 DIAGNOSIS — E559 Vitamin D deficiency, unspecified: Secondary | ICD-10-CM | POA: Diagnosis not present

## 2019-08-15 DIAGNOSIS — G20 Parkinson's disease: Secondary | ICD-10-CM | POA: Diagnosis not present

## 2019-08-15 DIAGNOSIS — I499 Cardiac arrhythmia, unspecified: Secondary | ICD-10-CM | POA: Diagnosis not present

## 2019-08-15 DIAGNOSIS — I959 Hypotension, unspecified: Secondary | ICD-10-CM | POA: Diagnosis not present

## 2019-08-16 DIAGNOSIS — M545 Low back pain: Secondary | ICD-10-CM | POA: Diagnosis not present

## 2019-08-16 DIAGNOSIS — Z9181 History of falling: Secondary | ICD-10-CM | POA: Diagnosis not present

## 2019-08-16 DIAGNOSIS — E559 Vitamin D deficiency, unspecified: Secondary | ICD-10-CM | POA: Diagnosis not present

## 2019-08-16 DIAGNOSIS — I959 Hypotension, unspecified: Secondary | ICD-10-CM | POA: Diagnosis not present

## 2019-08-16 DIAGNOSIS — N400 Enlarged prostate without lower urinary tract symptoms: Secondary | ICD-10-CM | POA: Diagnosis not present

## 2019-08-16 DIAGNOSIS — G20 Parkinson's disease: Secondary | ICD-10-CM | POA: Diagnosis not present

## 2019-08-16 DIAGNOSIS — Z87440 Personal history of urinary (tract) infections: Secondary | ICD-10-CM | POA: Diagnosis not present

## 2019-08-16 DIAGNOSIS — I10 Essential (primary) hypertension: Secondary | ICD-10-CM | POA: Diagnosis not present

## 2019-08-16 DIAGNOSIS — I499 Cardiac arrhythmia, unspecified: Secondary | ICD-10-CM | POA: Diagnosis not present

## 2019-08-17 DIAGNOSIS — I499 Cardiac arrhythmia, unspecified: Secondary | ICD-10-CM | POA: Diagnosis not present

## 2019-08-17 DIAGNOSIS — N400 Enlarged prostate without lower urinary tract symptoms: Secondary | ICD-10-CM | POA: Diagnosis not present

## 2019-08-17 DIAGNOSIS — Z87440 Personal history of urinary (tract) infections: Secondary | ICD-10-CM | POA: Diagnosis not present

## 2019-08-17 DIAGNOSIS — Z9181 History of falling: Secondary | ICD-10-CM | POA: Diagnosis not present

## 2019-08-17 DIAGNOSIS — E559 Vitamin D deficiency, unspecified: Secondary | ICD-10-CM | POA: Diagnosis not present

## 2019-08-17 DIAGNOSIS — I10 Essential (primary) hypertension: Secondary | ICD-10-CM | POA: Diagnosis not present

## 2019-08-17 DIAGNOSIS — I959 Hypotension, unspecified: Secondary | ICD-10-CM | POA: Diagnosis not present

## 2019-08-17 DIAGNOSIS — M545 Low back pain: Secondary | ICD-10-CM | POA: Diagnosis not present

## 2019-08-17 DIAGNOSIS — G20 Parkinson's disease: Secondary | ICD-10-CM | POA: Diagnosis not present

## 2019-08-22 DIAGNOSIS — G20 Parkinson's disease: Secondary | ICD-10-CM | POA: Diagnosis not present

## 2019-08-22 DIAGNOSIS — E559 Vitamin D deficiency, unspecified: Secondary | ICD-10-CM | POA: Diagnosis not present

## 2019-08-22 DIAGNOSIS — I959 Hypotension, unspecified: Secondary | ICD-10-CM | POA: Diagnosis not present

## 2019-08-22 DIAGNOSIS — M545 Low back pain: Secondary | ICD-10-CM | POA: Diagnosis not present

## 2019-08-22 DIAGNOSIS — Z87440 Personal history of urinary (tract) infections: Secondary | ICD-10-CM | POA: Diagnosis not present

## 2019-08-22 DIAGNOSIS — N400 Enlarged prostate without lower urinary tract symptoms: Secondary | ICD-10-CM | POA: Diagnosis not present

## 2019-08-22 DIAGNOSIS — Z9181 History of falling: Secondary | ICD-10-CM | POA: Diagnosis not present

## 2019-08-22 DIAGNOSIS — I499 Cardiac arrhythmia, unspecified: Secondary | ICD-10-CM | POA: Diagnosis not present

## 2019-08-22 DIAGNOSIS — I10 Essential (primary) hypertension: Secondary | ICD-10-CM | POA: Diagnosis not present

## 2019-08-22 DIAGNOSIS — Z87898 Personal history of other specified conditions: Secondary | ICD-10-CM | POA: Diagnosis not present

## 2019-08-27 DIAGNOSIS — I499 Cardiac arrhythmia, unspecified: Secondary | ICD-10-CM | POA: Diagnosis not present

## 2019-08-27 DIAGNOSIS — N400 Enlarged prostate without lower urinary tract symptoms: Secondary | ICD-10-CM | POA: Diagnosis not present

## 2019-08-27 DIAGNOSIS — M545 Low back pain: Secondary | ICD-10-CM | POA: Diagnosis not present

## 2019-08-27 DIAGNOSIS — Z9181 History of falling: Secondary | ICD-10-CM | POA: Diagnosis not present

## 2019-08-27 DIAGNOSIS — Z87440 Personal history of urinary (tract) infections: Secondary | ICD-10-CM | POA: Diagnosis not present

## 2019-08-27 DIAGNOSIS — I959 Hypotension, unspecified: Secondary | ICD-10-CM | POA: Diagnosis not present

## 2019-08-27 DIAGNOSIS — E559 Vitamin D deficiency, unspecified: Secondary | ICD-10-CM | POA: Diagnosis not present

## 2019-08-27 DIAGNOSIS — G20 Parkinson's disease: Secondary | ICD-10-CM | POA: Diagnosis not present

## 2019-08-27 DIAGNOSIS — I10 Essential (primary) hypertension: Secondary | ICD-10-CM | POA: Diagnosis not present

## 2019-08-29 DIAGNOSIS — Z87440 Personal history of urinary (tract) infections: Secondary | ICD-10-CM | POA: Diagnosis not present

## 2019-08-29 DIAGNOSIS — Z9181 History of falling: Secondary | ICD-10-CM | POA: Diagnosis not present

## 2019-08-29 DIAGNOSIS — I10 Essential (primary) hypertension: Secondary | ICD-10-CM | POA: Diagnosis not present

## 2019-08-29 DIAGNOSIS — I499 Cardiac arrhythmia, unspecified: Secondary | ICD-10-CM | POA: Diagnosis not present

## 2019-08-29 DIAGNOSIS — E559 Vitamin D deficiency, unspecified: Secondary | ICD-10-CM | POA: Diagnosis not present

## 2019-08-29 DIAGNOSIS — M545 Low back pain: Secondary | ICD-10-CM | POA: Diagnosis not present

## 2019-08-29 DIAGNOSIS — G20 Parkinson's disease: Secondary | ICD-10-CM | POA: Diagnosis not present

## 2019-08-29 DIAGNOSIS — N400 Enlarged prostate without lower urinary tract symptoms: Secondary | ICD-10-CM | POA: Diagnosis not present

## 2019-08-29 DIAGNOSIS — I959 Hypotension, unspecified: Secondary | ICD-10-CM | POA: Diagnosis not present

## 2019-08-30 ENCOUNTER — Telehealth: Payer: Self-pay | Admitting: Family Medicine

## 2019-08-30 NOTE — Telephone Encounter (Addendum)
Can we schedule in-office visit for evaluation tomorrow? Will need urine rechecked as he recently completed UTI treatment with bactrim.

## 2019-08-30 NOTE — Telephone Encounter (Signed)
Spoke with pt's wife, [name] (on dpr), asking for details for request.  She states pt has become more violent, verbally abusive and paranoid.  She's asking if pt can be prescribed a 'nerve' pill.

## 2019-08-30 NOTE — Telephone Encounter (Signed)
Spoke with [name] relaying Dr. Synthia Innocent message.  Scheduled pt tomorrow at 11:45.  She will bring urine sample.

## 2019-08-30 NOTE — Telephone Encounter (Signed)
Brandon Manning is requesting a call back regarding getting assistance with care. Pt wife is having a hard caring for him  And feels like she needs further help.   Requesting a call back from Dr [name] or Lattie Haw.   Please advise, thanks.

## 2019-08-31 ENCOUNTER — Encounter: Payer: Self-pay | Admitting: Family Medicine

## 2019-08-31 ENCOUNTER — Ambulatory Visit (INDEPENDENT_AMBULATORY_CARE_PROVIDER_SITE_OTHER): Payer: Medicare HMO | Admitting: Family Medicine

## 2019-08-31 ENCOUNTER — Other Ambulatory Visit: Payer: Self-pay

## 2019-08-31 VITALS — BP 112/62 | HR 84 | Temp 97.6°F | Ht 70.0 in

## 2019-08-31 DIAGNOSIS — G20 Parkinson's disease: Secondary | ICD-10-CM | POA: Diagnosis not present

## 2019-08-31 DIAGNOSIS — K5909 Other constipation: Secondary | ICD-10-CM

## 2019-08-31 DIAGNOSIS — F0281 Dementia in other diseases classified elsewhere with behavioral disturbance: Secondary | ICD-10-CM | POA: Diagnosis not present

## 2019-08-31 DIAGNOSIS — I10 Essential (primary) hypertension: Secondary | ICD-10-CM | POA: Diagnosis not present

## 2019-08-31 DIAGNOSIS — N390 Urinary tract infection, site not specified: Secondary | ICD-10-CM | POA: Diagnosis not present

## 2019-08-31 DIAGNOSIS — R4182 Altered mental status, unspecified: Secondary | ICD-10-CM | POA: Diagnosis not present

## 2019-08-31 DIAGNOSIS — R351 Nocturia: Secondary | ICD-10-CM

## 2019-08-31 DIAGNOSIS — N401 Enlarged prostate with lower urinary tract symptoms: Secondary | ICD-10-CM | POA: Diagnosis not present

## 2019-08-31 LAB — POC URINALSYSI DIPSTICK (AUTOMATED)
Bilirubin, UA: NEGATIVE
Blood, UA: NEGATIVE
Glucose, UA: NEGATIVE
Ketones, UA: NEGATIVE
Leukocytes, UA: NEGATIVE
Nitrite, UA: NEGATIVE
Protein, UA: NEGATIVE
Spec Grav, UA: 1.015 (ref 1.010–1.025)
Urobilinogen, UA: 0.2 E.U./dL
pH, UA: 8 (ref 5.0–8.0)

## 2019-08-31 LAB — COMPREHENSIVE METABOLIC PANEL
ALT: 2 U/L (ref 0–53)
AST: 10 U/L (ref 0–37)
Albumin: 3.3 g/dL — ABNORMAL LOW (ref 3.5–5.2)
Alkaline Phosphatase: 49 U/L (ref 39–117)
BUN: 21 mg/dL (ref 6–23)
CO2: 29 mEq/L (ref 19–32)
Calcium: 9.1 mg/dL (ref 8.4–10.5)
Chloride: 106 mEq/L (ref 96–112)
Creatinine, Ser: 1.58 mg/dL — ABNORMAL HIGH (ref 0.40–1.50)
GFR: 51.89 mL/min — ABNORMAL LOW (ref >60.00)
Glucose, Bld: 117 mg/dL — ABNORMAL HIGH (ref 70–99)
Potassium: 4.1 mEq/L (ref 3.5–5.1)
Sodium: 141 mEq/L (ref 135–145)
Total Bilirubin: 0.6 mg/dL (ref 0.2–1.2)
Total Protein: 6.4 g/dL (ref 6.0–8.3)

## 2019-08-31 LAB — CBC WITH DIFFERENTIAL/PLATELET
Basophils Absolute: 0 10*3/uL (ref 0.0–0.1)
Basophils Relative: 0.2 % (ref 0.0–3.0)
Eosinophils Absolute: 0.1 10*3/uL (ref 0.0–0.7)
Eosinophils Relative: 0.9 % (ref 0.0–5.0)
HCT: 35 % — ABNORMAL LOW (ref 39.0–52.0)
Hemoglobin: 11.3 g/dL — ABNORMAL LOW (ref 13.0–17.0)
Lymphocytes Relative: 33 % (ref 12.0–46.0)
Lymphs Abs: 3.3 10*3/uL (ref 0.7–4.0)
MCHC: 32.2 g/dL (ref 30.0–36.0)
MCV: 82.9 fl (ref 78.0–100.0)
Monocytes Absolute: 0.7 10*3/uL (ref 0.1–1.0)
Monocytes Relative: 7.5 % (ref 3.0–12.0)
Neutro Abs: 5.8 10*3/uL (ref 1.4–7.7)
Neutrophils Relative %: 58.4 % (ref 43.0–77.0)
Platelets: 353 10*3/uL (ref 150.0–400.0)
RBC: 4.22 Mil/uL (ref 4.22–5.81)
RDW: 16.6 % — ABNORMAL HIGH (ref 11.5–15.5)
WBC: 9.9 10*3/uL (ref 4.0–10.5)

## 2019-08-31 LAB — TSH: TSH: 2.88 u[IU]/mL (ref 0.35–4.50)

## 2019-08-31 MED ORDER — FINASTERIDE 5 MG PO TABS: 5.0000 mg | ORAL_TABLET | Freq: Every day | ORAL | 6 refills | Status: DC

## 2019-08-31 MED ORDER — SERTRALINE HCL 25 MG PO TABS: 25.0000 mg | ORAL_TABLET | Freq: Every day | ORAL | 6 refills | Status: DC

## 2019-08-31 NOTE — Progress Notes (Signed)
This visit was conducted in person.  BP 112/62 (BP Location: Left Arm, Patient Position: Sitting, Cuff Size: Normal)   Pulse 84   Temp 97.6 F (36.4 C) (Temporal)   Ht 5\' 10"  (1.778 m)   SpO2 97%   BMI 26.40 kg/m    CC: AMS Subjective:    Patient ID: Brandon Manning, male    DOB: 1946-05-16, 74 y.o.   MRN: [id number]  HPI: Brandon Manning is a 74 y.o. male presenting on 08/31/2019 for Altered Mental Status (Per pt's wife, [name] (temp 97.9)- with pt today, pt has become violent, verbally abusive and very paranoid.)   Virtual visit yesterday with Tor Netters last month for increased agitation with dysuria note reviewed - UA normal but UCx grew E coli UTI, treated with 1 wk bactrim course. Symptoms seemed to fully resolve.   Now over the last 1-2 wks noticing increasing agitation, hallucinations, confusion. Increased aggression (pushing and verbal abuse) - not at all like patient. Wife cares for him, she is sole caregiver and unable to physically take care of him. No recent falls or open sores. Wife notes he exhibits increasing weakness over the last few weeks as well.   Notes ongoing "episodes" of becoming unresponsive - these last 2-3 minutes at a time, eyes roll back, wife manages with cool compress and calling his name. No obvious seizure activity present. Seeing Dr End 09/12/2019 - recent heart monitor showing irregular heart rate but no obvious cause for syncope.   New nursing aide, PT from [facility] out of [place].   Known parkinson's disease on sinemet IR 25/100 2/2/1. Significant autonomic dysfunction with h/o labile orthostasis.   He is drinking ensure supplement daily. Normal bowel movements on daily miralax, normal urination. Staying well hydrated.   Nocturia at night - wakes up every hour, voids small amounts. Wife would like medication to help with this.   No fever, chest pain, abd pain, cough or dyspnea. No pain.      Relevant past medical, surgical,  family and social history reviewed and updated as indicated. Interim medical history since our last visit reviewed. Allergies and medications reviewed and updated. Outpatient Medications Prior to Visit  Medication Sig Dispense Refill  . amLODipine (NORVASC) 2.5 MG tablet Take 1 tablet (2.5 mg total) by mouth daily. 90 tablet 3  . aspirin EC 81 MG tablet Take 81 mg by mouth every other day.    . carbidopa-levodopa (SINEMET IR) 25-100 MG tablet 2 in the 7am, 2 at noon, 1 at 4pm 450 tablet 2  . Cholecalciferol (VITAMIN D) 50 MCG (2000 UT) CAPS Take 1 capsule (2,000 Units total) by mouth daily. 30 capsule   . metoprolol tartrate (LOPRESSOR) 25 MG tablet Take 0.5 tablets (12.5 mg total) by mouth 2 (two) times daily. 90 tablet 2  . Multiple Vitamins-Minerals (MULTIVITAMIN PO) Take 1 tablet by mouth daily.    . polyethylene glycol (MIRALAX / GLYCOLAX) 17 g packet Take 17 g by mouth daily. 14 each 0   No facility-administered medications prior to visit.     Per HPI unless specifically indicated in ROS section below Review of Systems Objective:    BP 112/62 (BP Location: Left Arm, Patient Position: Sitting, Cuff Size: Normal)   Pulse 84   Temp 97.6 F (36.4 C) (Temporal)   Ht 5\' 10"  (1.778 m)   SpO2 97%   BMI 26.40 kg/m   Wt Readings from Last 3 Encounters:  08/10/19 184 lb (83.5  kg)  07/30/19 184 lb (83.5 kg)  07/19/19 184 lb (83.5 kg)    Physical Exam Vitals and nursing note reviewed.  Constitutional:      Appearance: Normal appearance. He is not ill-appearing.     Comments: Very somnolent during visit, sitting in wheelchair, difficult to arouse, wakes up at end of visit (has not yet received his sinemet this morning)  Cardiovascular:     Rate and Rhythm: Normal rate and regular rhythm.     Pulses: Normal pulses.     Heart sounds: Normal heart sounds. No murmur.  Pulmonary:     Effort: Pulmonary effort is normal. No respiratory distress.     Breath sounds: Normal breath sounds. No  wheezing, rhonchi or rales.  Musculoskeletal:     Right lower leg: No edema.     Left lower leg: No edema.  Neurological:     Mental Status: He is alert.     Comments: Cogwheel rigidity of upper extremities       Results for orders placed or performed in visit on 08/31/19  Comprehensive metabolic panel  Result Value Ref Range   Sodium 141 135 - 145 mEq/L   Potassium 4.1 3.5 - 5.1 mEq/L   Chloride 106 96 - 112 mEq/L   CO2 29 19 - 32 mEq/L   Glucose, Bld 117 (H) 70 - 99 mg/dL   BUN 21 6 - 23 mg/dL   Creatinine, Ser 1.58 (H) 0.40 - 1.50 mg/dL   Total Bilirubin 0.6 0.2 - 1.2 mg/dL   Alkaline Phosphatase 49 39 - 117 U/L   AST 10 0 - 37 U/L   ALT 2 0 - 53 U/L   Total Protein 6.4 6.0 - 8.3 g/dL   Albumin 3.3 (L) 3.5 - 5.2 g/dL   GFR 51.89 (L) >60.00 mL/min   Calcium 9.1 8.4 - 10.5 mg/dL  TSH  Result Value Ref Range   TSH 2.88 0.35 - 4.50 uIU/mL  CBC with Differential/Platelet  Result Value Ref Range   WBC 9.9 4.0 - 10.5 K/uL   RBC 4.22 4.22 - 5.81 Mil/uL   Hemoglobin 11.3 (L) 13.0 - 17.0 g/dL   HCT 35.0 (L) 39.0 - 52.0 %   MCV 82.9 78.0 - 100.0 fl   MCHC 32.2 30.0 - 36.0 g/dL   RDW 16.6 (H) 11.5 - 15.5 %   Platelets 353.0 150.0 - 400.0 K/uL   Neutrophils Relative % 58.4 43.0 - 77.0 %   Lymphocytes Relative 33.0 12.0 - 46.0 %   Monocytes Relative 7.5 3.0 - 12.0 %   Eosinophils Relative 0.9 0.0 - 5.0 %   Basophils Relative 0.2 0.0 - 3.0 %   Neutro Abs 5.8 1.4 - 7.7 K/uL   Lymphs Abs 3.3 0.7 - 4.0 K/uL   Monocytes Absolute 0.7 0.1 - 1.0 K/uL   Eosinophils Absolute 0.1 0.0 - 0.7 K/uL   Basophils Absolute 0.0 0.0 - 0.1 K/uL  POCT Urinalysis Dipstick (Automated)  Result Value Ref Range   Color, UA yellow    Clarity, UA clear    Glucose, UA Negative Negative   Bilirubin, UA negative    Ketones, UA negative    Spec Grav, UA 1.015 1.010 - 1.025   Blood, UA negative    pH, UA 8.0 5.0 - 8.0   Protein, UA Negative Negative   Urobilinogen, UA 0.2 0.2 or 1.0 E.U./dL    Nitrite, UA negative    Leukocytes, UA Negative Negative   Assessment & Plan:  This  visit occurred during the SARS-CoV-2 public health emergency.  Safety protocols were in place, including screening questions prior to the visit, additional usage of staff PPE, and extensive cleaning of exam room while observing appropriate contact time as indicated for disinfecting solutions.   Problem List Items Addressed This Visit    Urinary tract infection in elderly patient    This seems to have fully resolved after 1 wk bactirm course. Recheck UA - clear.       Parkinson disease ([place])    Concern for progression of disease. Excessive daytime sleepiness present, ?"sleep attacks". Seeing cards.  rec f/u with neurology. Appreciate Dr Doristine Devoid care.  Trial sertraline 25mg  daily for increased agitation/aggression noted by wife.       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       Relevant Medications   sertraline (ZOLOFT) 25 MG tablet   Chronic constipation    Stable period on daily miralax.       BPH (benign prostatic hyperplasia)    Concern for recurrent symptomatic BPH - will restart finasteride. Avoid alpha 1 blockers due to orthostasis.       Relevant Medications   finasteride (PROSCAR) 5 MG tablet   Altered mental status - Primary    Increasing somnolence noted by wife as well as increasing confusion and agitation associated with aggression. Excessive daytime sleepiness present.  Episodes of becoming unresponsive ?sleep attacks. Fortunately no recent falls.  No signs of constipation, UTI, pain. Anticipate progression of parkinson's disease - discussed.  Check labwork to r/o reversible causes of AMS.  Already receiving HH - with increased level of care need, needs further assistance at home.  Will ask [place] Education officer, museum for evaluation and assistance with community resources.   Recent event monitor:   Eight episodes of supraventricular tachycardia occurred, lasting up to 9 beats with a maximal rate of 138 bpm.  Single episode of Mobitz type I second-degree AV block occurred with a nonconducted P wave and a 2.1 second ventricular pause (08/09/19 at 4:05 PM).  No sustained arrhythmia was identified.      Relevant Orders   POCT Urinalysis Dipstick (Automated) (Completed)   Comprehensive metabolic panel (Completed)   TSH (Completed)   CBC with Differential/Platelet (Completed)       Meds ordered this encounter  Medications  . finasteride (PROSCAR) 5 MG tablet    Sig: Take 1 tablet (5 mg total) by mouth daily.    Dispense:  30 tablet    Refill:  6  . sertraline (ZOLOFT) 25 MG tablet    Sig: Take 1 tablet (25 mg total) by mouth daily.    Dispense:  30 tablet    Refill:  6   Orders Placed This Encounter  Procedures  . Comprehensive metabolic panel  . TSH  . CBC with Differential/Platelet  . POCT Urinalysis Dipstick (Automated)    Patient Instructions  Labs today.  We may get head imaging.  I want you to return to see Dr Tat later this month.  May start finasteride 5mg  daily for prostate.  Start sertraline 25mg  daily.    Follow up plan: No follow-ups on file.  [name], MD

## 2019-08-31 NOTE — Patient Instructions (Addendum)
Labs today.  We may get head imaging.  I want you to return to see Dr Tat later this month.  May start finasteride 5mg  daily for prostate.  Start sertraline 25mg  daily.

## 2019-09-03 ENCOUNTER — Encounter: Payer: Self-pay | Admitting: Family Medicine

## 2019-09-03 DIAGNOSIS — E559 Vitamin D deficiency, unspecified: Secondary | ICD-10-CM | POA: Diagnosis not present

## 2019-09-03 DIAGNOSIS — I10 Essential (primary) hypertension: Secondary | ICD-10-CM | POA: Diagnosis not present

## 2019-09-03 DIAGNOSIS — M545 Low back pain: Secondary | ICD-10-CM | POA: Diagnosis not present

## 2019-09-03 DIAGNOSIS — N400 Enlarged prostate without lower urinary tract symptoms: Secondary | ICD-10-CM | POA: Diagnosis not present

## 2019-09-03 DIAGNOSIS — R4182 Altered mental status, unspecified: Secondary | ICD-10-CM | POA: Insufficient documentation

## 2019-09-03 DIAGNOSIS — I499 Cardiac arrhythmia, unspecified: Secondary | ICD-10-CM | POA: Diagnosis not present

## 2019-09-03 DIAGNOSIS — G20 Parkinson's disease: Secondary | ICD-10-CM | POA: Diagnosis not present

## 2019-09-03 DIAGNOSIS — Z87440 Personal history of urinary (tract) infections: Secondary | ICD-10-CM | POA: Diagnosis not present

## 2019-09-03 DIAGNOSIS — I959 Hypotension, unspecified: Secondary | ICD-10-CM | POA: Diagnosis not present

## 2019-09-03 DIAGNOSIS — Z9181 History of falling: Secondary | ICD-10-CM | POA: Diagnosis not present

## 2019-09-03 NOTE — Assessment & Plan Note (Signed)
Start sertraline 25 mg daily.

## 2019-09-03 NOTE — Assessment & Plan Note (Addendum)
Increasing somnolence noted by wife as well as increasing confusion and agitation associated with aggression. Excessive daytime sleepiness present.  Episodes of becoming unresponsive ?sleep attacks. Fortunately no recent falls.  No signs of constipation, UTI, pain. Anticipate progression of parkinson's disease - discussed.  Check labwork to r/o reversible causes of AMS.  Already receiving HH - with increased level of care need, needs further assistance at home. Will ask [place] Education officer, museum for evaluation and assistance with community resources.   Recent event monitor:   Eight episodes of supraventricular tachycardia occurred, lasting up to 9 beats with a maximal rate of 138 bpm.  Single episode of Mobitz type I second-degree AV block occurred with a nonconducted P wave and a 2.1 second ventricular pause (08/09/19 at 4:05 PM).  No sustained arrhythmia was identified.

## 2019-09-03 NOTE — Assessment & Plan Note (Signed)
Largely fluctuating due to parkinson dysautonomia - need to treat standing BP to avoid orthostatic syncope/falls.

## 2019-09-03 NOTE — Assessment & Plan Note (Signed)
Stable period on daily miralax.

## 2019-09-03 NOTE — Assessment & Plan Note (Addendum)
Concern for progression of disease. Excessive daytime sleepiness present, ?"sleep attacks". Seeing cards.  rec f/u with neurology. Appreciate Dr Doristine Devoid care.  Trial sertraline 25mg  daily for increased agitation/aggression noted by wife.

## 2019-09-03 NOTE — Assessment & Plan Note (Addendum)
This seems to have fully resolved after 1 wk bactirm course. Recheck UA - clear.

## 2019-09-03 NOTE — Assessment & Plan Note (Signed)
Concern for recurrent symptomatic BPH - will restart finasteride. Avoid alpha 1 blockers due to orthostasis.

## 2019-09-07 ENCOUNTER — Telehealth: Payer: Self-pay | Admitting: Family Medicine

## 2019-09-07 NOTE — Telephone Encounter (Signed)
Called and gave Verbal Orders to add Social Worker to [employer] at [facility]. Called patients wife and told her we have asked for additional services and she should receive a call.

## 2019-09-10 ENCOUNTER — Other Ambulatory Visit: Payer: Self-pay | Admitting: Neurology

## 2019-09-10 DIAGNOSIS — G20 Parkinson's disease: Secondary | ICD-10-CM

## 2019-09-11 DIAGNOSIS — H524 Presbyopia: Secondary | ICD-10-CM | POA: Diagnosis not present

## 2019-09-11 DIAGNOSIS — Z0100 Encounter for examination of eyes and vision without abnormal findings: Secondary | ICD-10-CM | POA: Diagnosis not present

## 2019-09-12 ENCOUNTER — Encounter: Payer: Self-pay | Admitting: Internal Medicine

## 2019-09-12 ENCOUNTER — Ambulatory Visit (INDEPENDENT_AMBULATORY_CARE_PROVIDER_SITE_OTHER): Payer: Medicare HMO | Admitting: Internal Medicine

## 2019-09-12 ENCOUNTER — Other Ambulatory Visit: Payer: Self-pay

## 2019-09-12 VITALS — BP 96/64 | HR 83 | Ht 70.0 in | Wt 184.0 lb

## 2019-09-12 DIAGNOSIS — E559 Vitamin D deficiency, unspecified: Secondary | ICD-10-CM | POA: Diagnosis not present

## 2019-09-12 DIAGNOSIS — Z9181 History of falling: Secondary | ICD-10-CM | POA: Diagnosis not present

## 2019-09-12 DIAGNOSIS — R0600 Dyspnea, unspecified: Secondary | ICD-10-CM | POA: Diagnosis not present

## 2019-09-12 DIAGNOSIS — R55 Syncope and collapse: Secondary | ICD-10-CM

## 2019-09-12 DIAGNOSIS — I499 Cardiac arrhythmia, unspecified: Secondary | ICD-10-CM | POA: Diagnosis not present

## 2019-09-12 DIAGNOSIS — I471 Supraventricular tachycardia: Secondary | ICD-10-CM | POA: Diagnosis not present

## 2019-09-12 DIAGNOSIS — I10 Essential (primary) hypertension: Secondary | ICD-10-CM | POA: Diagnosis not present

## 2019-09-12 DIAGNOSIS — G20 Parkinson's disease: Secondary | ICD-10-CM | POA: Diagnosis not present

## 2019-09-12 DIAGNOSIS — I959 Hypotension, unspecified: Secondary | ICD-10-CM | POA: Diagnosis not present

## 2019-09-12 DIAGNOSIS — N400 Enlarged prostate without lower urinary tract symptoms: Secondary | ICD-10-CM | POA: Diagnosis not present

## 2019-09-12 DIAGNOSIS — M545 Low back pain: Secondary | ICD-10-CM | POA: Diagnosis not present

## 2019-09-12 DIAGNOSIS — R0609 Other forms of dyspnea: Secondary | ICD-10-CM

## 2019-09-12 DIAGNOSIS — Z87440 Personal history of urinary (tract) infections: Secondary | ICD-10-CM | POA: Diagnosis not present

## 2019-09-12 NOTE — Patient Instructions (Addendum)
Medication Instructions:  Your physician has recommended you make the following change in your medication:  1- STOP Metoprolol.  *If you need a refill on your cardiac medications before your next appointment, please call your pharmacy*  Lab Work: none If you have labs (blood work) drawn today and your tests are completely normal, you will receive your results only by: Marland Kitchen MyChart Message (if you have MyChart) OR . A paper copy in the mail If you have any lab test that is abnormal or we need to change your treatment, we will call you to review the results.  Testing/Procedures: Your physician has requested that you have a lexiscan myoview. For further information please visit [email or web address]. Please follow instruction sheet, as given.  [place]  Your caregiver has ordered a Stress Test with nuclear imaging. The purpose of this test is to evaluate the blood supply to your heart muscle. This procedure is referred to as a "Non-Invasive Stress Test." This is because other than having an IV started in your vein, nothing is inserted or "invades" your body. Cardiac stress tests are done to find areas of poor blood flow to the heart by determining the extent of coronary artery disease (CAD). Some patients exercise on a treadmill, which naturally increases the blood flow to your heart, while others who are  unable to walk on a treadmill due to physical limitations have a pharmacologic/chemical stress agent called Lexiscan . This medicine will mimic walking on a treadmill by temporarily increasing your coronary blood flow.   Please note: these test may take anywhere between 2-4 hours to complete  PLEASE REPORT TO [facility] AT THE FIRST DESK WILL DIRECT YOU WHERE TO GO  Date of Procedure:_____________________________________  Arrival Time for Procedure:______________________________  PLEASE NOTIFY THE OFFICE AT LEAST 24 HOURS IN ADVANCE IF YOU ARE UNABLE TO KEEP  YOUR APPOINTMENT.  [phone] AND  PLEASE NOTIFY NUCLEAR MEDICINE AT [facility] AT LEAST 24 HOURS IN ADVANCE IF YOU ARE UNABLE TO KEEP YOUR APPOINTMENT. [phone]  How to prepare for your Myoview test:  1. Do not eat or drink after midnight 2. No caffeine for 24 hours prior to test 3. No smoking 24 hours prior to test. 4. Your medication may be taken with water.  If your doctor stopped a medication because of this test, do not take that medication. 5. Please wear a short sleeve shirt. 6. No perfume, cologne or lotion. 7. Wear comfortable walking shoes.   Follow-Up: At [facility], you and your health needs are our priority.  As part of our continuing mission to provide you with exceptional heart care, we have created designated Provider Care Teams.  These Care Teams include your primary Cardiologist (physician) and Advanced Practice Providers (APPs -  Physician Assistants and Nurse Practitioners) who all work together to provide you with the care you need, when you need it.  Your next appointment:   1 month(s)  The format for your next appointment:   In Person  Provider:    You may see DR Harrell Gave END or one of the following Advanced Practice Providers on your designated Care Team:    Murray Hodgkins, NP  Christell Faith, PA-C  Marrianne Mood, PA-C    Cardiac Nuclear Scan A cardiac nuclear scan is a test that measures blood flow to the heart when a person is resting and when he or she is exercising. The test looks for problems such as:  Not enough blood reaching a portion  of the heart.  The heart muscle not working normally. You may need this test if:  You have heart disease.  You have had abnormal lab results.  You have had heart surgery or a balloon procedure to open up blocked arteries (angioplasty).  You have chest pain.  You have shortness of breath. In this test, a radioactive dye (tracer) is injected into your bloodstream. After the tracer has traveled to  your heart, an imaging device is used to measure how much of the tracer is absorbed by or distributed to various areas of your heart. This procedure is usually done at a hospital and takes 2-4 hours. Tell a health care provider about:  Any allergies you have.  All medicines you are taking, including vitamins, herbs, eye drops, creams, and over-the-counter medicines.  Any problems you or family members have had with anesthetic medicines.  Any blood disorders you have.  Any surgeries you have had.  Any medical conditions you have.  Whether you are pregnant or may be pregnant. What are the risks? Generally, this is a safe procedure. However, problems may occur, including:  Serious chest pain and heart attack. This is only a risk if the stress portion of the test is done.  Rapid heartbeat.  Sensation of warmth in your chest. This usually passes quickly.  Allergic reaction to the tracer. What happens before the procedure?  Ask your health care provider about changing or stopping your regular medicines. This is especially important if you are taking diabetes medicines or blood thinners.  Follow instructions from your health care provider about eating or drinking restrictions.  Remove your jewelry on the day of the procedure. What happens during the procedure?  An IV will be inserted into one of your veins.  Your health care provider will inject a small amount of radioactive tracer through the IV.  You will wait for 20-40 minutes while the tracer travels through your bloodstream.  Your heart activity will be monitored with an electrocardiogram (ECG).  You will lie down on an exam table.  Images of your heart will be taken for about 15-20 minutes.  You may also have a stress test. For this test, one of the following may be done: ? You will exercise on a treadmill or stationary bike. While you exercise, your heart's activity will be monitored with an ECG, and your blood  pressure will be checked. ? You will be given medicines that will increase blood flow to parts of your heart. This is done if you are unable to exercise.  When blood flow to your heart has peaked, a tracer will again be injected through the IV.  After 20-40 minutes, you will get back on the exam table and have more images taken of your heart.  Depending on the type of tracer used, scans may need to be repeated 3-4 hours later.  Your IV line will be removed when the procedure is over. The procedure may vary among health care providers and hospitals. What happens after the procedure?  Unless your health care provider tells you otherwise, you may return to your normal schedule, including diet, activities, and medicines.  Unless your health care provider tells you otherwise, you may increase your fluid intake. This will help to flush the contrast dye from your body. Drink enough fluid to keep your urine pale yellow.  Ask your health care provider, or the department that is doing the test: ? When will my results be ready? ? How will I  get my results? Summary  A cardiac nuclear scan measures the blood flow to the heart when a person is resting and when he or she is exercising.  Tell your health care provider if you are pregnant.  Before the procedure, ask your health care provider about changing or stopping your regular medicines. This is especially important if you are taking diabetes medicines or blood thinners.  After the procedure, unless your health care provider tells you otherwise, increase your fluid intake. This will help flush the contrast dye from your body.  After the procedure, unless your health care provider tells you otherwise, you may return to your normal schedule, including diet, activities, and medicines. This information is not intended to replace advice given to you by your health care provider. Make sure you discuss any questions you have with your health care  provider. Document Revised: 12/19/2017 Document Reviewed: 12/19/2017 Elsevier Patient Education  [place].

## 2019-09-12 NOTE — Progress Notes (Signed)
Follow-up Outpatient Visit Date: 09/12/2019  Primary Care Provider: [name], MD [place] [address]  Chief Complaint: Syncope  HPI:  Brandon Manning is a 74 y.o. male with history of hypertension, Parkinson's disease, and kidney injury, who presents for follow-up of syncope.  I met Brandon Manning the last month after episode of altered mental status and weakness on several occasions last year.  It was felt that episodes may have been related to orthostatic hypotension in the setting of autonomic dysfunction related to Parkinson's disease.  We agreed to decrease metoprolol tartrate to 12.5 mg twice daily and obtain a 14-day event monitor.  This showed predominantly sinus rhythm with rare PACs and PVCs as well as brief PSVT.  A single episode of Mobitz type I second-degree AV block was noted.  Today, Brandon Manning reports that he continues to have some weakness.  His wife, who provides most of the history, states that her husband has had 14 "episodes" over the last month including while wearing the event monitor.  She notes that he becomes transiently unresponsive with heavy breathing.  The episodes last a few minutes.  There are no obvious warning signs.  They always occur while Brandon Manning is seated.  He has not fallen or injured himself.  Frequency of episodes has seemed to improve with dose reduction of metoprolol.  Brandon Manning denies chest pain palpitations and edema.  He notes several episodes of marked weakness and exertional dyspnea when transferring from his wheelchair.  --------------------------------------------------------------------------------------------------  Cardiovascular History & Procedures: Cardiovascular Problems:  Syncope  PSVT  Transient Mobitz type I second-degree AV block  Risk Factors:  Male gender, age greater than 42  Cath/PCI:  None  CV Surgery:  None  EP Procedures and Devices:  14-day event monitor (07/30/2019):  Predominantly sinus rhythm with rare PACs and PVCs as well as brief episodes of PSVT.  Single episode of Mobitz type I second-degree AV block occurred with nonconducted P wave leading to 2.1-second ventricular pause.  Non-Invasive Evaluation(s):  TTE (05/19/2019): LVEF 55-60% with normal wall thickness.  Normal RV size and function.  No significant valvular abnormality.  Recent CV Pertinent Labs: Lab Results  Component Value Date   CHOL 174 05/03/2019   HDL 48.80 05/03/2019   LDLCALC 102 (H) 05/03/2019   TRIG 118.0 05/03/2019   CHOLHDL 4 05/03/2019   K 4.1 08/31/2019   MG 1.9 05/14/2019   BUN 21 08/31/2019   CREATININE 1.58 (H) 08/31/2019   CREATININE 1.08 01/26/2019    Past medical and surgical history were reviewed and updated in EPIC.  Current Meds  Medication Sig  . amLODipine (NORVASC) 2.5 MG tablet Take 1 tablet (2.5 mg total) by mouth daily.  Marland Kitchen aspirin EC 81 MG tablet Take 81 mg by mouth every other day.  . carbidopa-levodopa (SINEMET IR) 25-100 MG tablet TAKE 2 TABLETS AT 7AM, 2 TABLETS AT NOON AND  1 TABLET AT 4PM  . Cholecalciferol (VITAMIN D) 50 MCG (2000 UT) CAPS Take 1 capsule (2,000 Units total) by mouth daily.  . finasteride (PROSCAR) 5 MG tablet Take 1 tablet (5 mg total) by mouth daily.  . Multiple Vitamins-Minerals (MULTIVITAMIN PO) Take 1 tablet by mouth daily.  . polyethylene glycol (MIRALAX / GLYCOLAX) 17 g packet Take 17 g by mouth daily.  . sertraline (ZOLOFT) 25 MG tablet Take 1 tablet (25 mg total) by mouth daily.  . [DISCONTINUED] metoprolol tartrate (LOPRESSOR) 25 MG tablet Take 0.5 tablets (12.5 mg total)  by mouth 2 (two) times daily.    Allergies: Penicillins  Social History   Tobacco Use  . Smoking status: Former Smoker    Quit date: 02/01/1986    Years since quitting: 33.6  . Smokeless tobacco: Never Used  Substance Use Topics  . Alcohol use: No    Alcohol/week: 0.0 standard drinks  . Drug use: No    Family History  Problem Relation  Age of Onset  . Kidney disease Mother   . Hypertension Mother   . Arthritis Father   . Healthy Son   . Diabetes Sister   . Cancer Neg Hx   . Coronary artery disease Neg Hx   . Stroke Neg Hx   . Hyperlipidemia Neg Hx     Review of Systems: A 12-system review of systems was performed and was negative except as noted in the HPI.  --------------------------------------------------------------------------------------------------  Physical Exam: BP 96/64 (BP Location: Left Arm, Patient Position: Sitting, Cuff Size: Normal)   Pulse 83   Ht '5\' 10"'$  (1.778 m)   Wt 184 lb (83.5 kg)   BMI 26.40 kg/m   General: NAD.  Accompanied by wife.  Seated in a wheelchair. Neck: No JVD or HJR. Lungs: Normal work of breathing. Clear to auscultation bilaterally without wheezes or crackles. Heart: Regular rate and rhythm without murmurs, rubs, or gallops. Non-displaced PMI. Abd: Bowel sounds present. Soft, NT/ND without hepatosplenomegaly Ext: No lower extremity edema  EKG: Baseline artifact noted.  Normal sinus rhythm without significant abnormality.  Lab Results  Component Value Date   WBC 9.9 08/31/2019   HGB 11.3 (L) 08/31/2019   HCT 35.0 (L) 08/31/2019   MCV 82.9 08/31/2019   PLT 353.0 08/31/2019    Lab Results  Component Value Date   NA 141 08/31/2019   K 4.1 08/31/2019   CL 106 08/31/2019   CO2 29 08/31/2019   BUN 21 08/31/2019   CREATININE 1.58 (H) 08/31/2019   GLUCOSE 117 (H) 08/31/2019   ALT 2 08/31/2019    Lab Results  Component Value Date   CHOL 174 05/03/2019   HDL 48.80 05/03/2019   LDLCALC 102 (H) 05/03/2019   TRIG 118.0 05/03/2019   CHOLHDL 4 05/03/2019    --------------------------------------------------------------------------------------------------  ASSESSMENT AND PLAN: Recurrent syncope and PSVT: Underlying etiology remains unclear.  Event monitor was notable for a few brief episodes of PSVT as well as a brief period of Mobitz type I second-degree AV  block.  Patient's wife reports that he had multiple episodes while wearing the monitor without obvious cause on the rhythm strips.  Echocardiogram also did not show a significant structural abnormality to explain syncope.  Nonetheless, I think it would be prudent to discontinue metoprolol altogether in the setting of transient Mobitz type I second-degree AV block.  We have agreed to proceed with a pharmacologic myocardial perfusion stress test to exclude underlying ischemia, particularly given report of intermittent exertional dyspnea.  If episodes persist and ischemia evaluation is unrevealing, we will need to refer Brandon Manning to EP.  I suspect that some degree of autonomic dysfunction is contributing.  I have encouraged Brandon Manning to stay well-hydrated as well as to wear compression stockings and tight fitting clothes around his thighs and abdomen.  Dyspnea on exertion: Intermittently present with minimal activity.  Echocardiogram without significant abnormality in 04/2019.  I have recommended that we obtain a pharmacologic myocardial perfusion stress test to exclude underlying ischemia.  Follow-up: Return to clinic in 1 month.  Nelva Bush, MD 09/13/2019  11:22 AM

## 2019-09-13 ENCOUNTER — Encounter: Payer: Self-pay | Admitting: Internal Medicine

## 2019-09-13 DIAGNOSIS — N400 Enlarged prostate without lower urinary tract symptoms: Secondary | ICD-10-CM | POA: Diagnosis not present

## 2019-09-13 DIAGNOSIS — G20 Parkinson's disease: Secondary | ICD-10-CM | POA: Diagnosis not present

## 2019-09-13 DIAGNOSIS — M545 Low back pain: Secondary | ICD-10-CM | POA: Diagnosis not present

## 2019-09-13 DIAGNOSIS — I471 Supraventricular tachycardia: Secondary | ICD-10-CM | POA: Insufficient documentation

## 2019-09-13 DIAGNOSIS — E559 Vitamin D deficiency, unspecified: Secondary | ICD-10-CM | POA: Diagnosis not present

## 2019-09-13 DIAGNOSIS — I10 Essential (primary) hypertension: Secondary | ICD-10-CM | POA: Diagnosis not present

## 2019-09-13 DIAGNOSIS — I499 Cardiac arrhythmia, unspecified: Secondary | ICD-10-CM | POA: Diagnosis not present

## 2019-09-13 DIAGNOSIS — I959 Hypotension, unspecified: Secondary | ICD-10-CM | POA: Diagnosis not present

## 2019-09-13 DIAGNOSIS — Z87440 Personal history of urinary (tract) infections: Secondary | ICD-10-CM | POA: Diagnosis not present

## 2019-09-13 DIAGNOSIS — Z9181 History of falling: Secondary | ICD-10-CM | POA: Diagnosis not present

## 2019-09-18 DIAGNOSIS — I959 Hypotension, unspecified: Secondary | ICD-10-CM | POA: Diagnosis not present

## 2019-09-18 DIAGNOSIS — G20 Parkinson's disease: Secondary | ICD-10-CM | POA: Diagnosis not present

## 2019-09-18 DIAGNOSIS — M545 Low back pain: Secondary | ICD-10-CM | POA: Diagnosis not present

## 2019-09-18 DIAGNOSIS — I10 Essential (primary) hypertension: Secondary | ICD-10-CM | POA: Diagnosis not present

## 2019-09-18 DIAGNOSIS — I499 Cardiac arrhythmia, unspecified: Secondary | ICD-10-CM | POA: Diagnosis not present

## 2019-09-18 DIAGNOSIS — Z87440 Personal history of urinary (tract) infections: Secondary | ICD-10-CM | POA: Diagnosis not present

## 2019-09-18 DIAGNOSIS — E559 Vitamin D deficiency, unspecified: Secondary | ICD-10-CM | POA: Diagnosis not present

## 2019-09-18 DIAGNOSIS — N400 Enlarged prostate without lower urinary tract symptoms: Secondary | ICD-10-CM | POA: Diagnosis not present

## 2019-09-18 DIAGNOSIS — Z9181 History of falling: Secondary | ICD-10-CM | POA: Diagnosis not present

## 2019-09-19 DIAGNOSIS — N400 Enlarged prostate without lower urinary tract symptoms: Secondary | ICD-10-CM | POA: Diagnosis not present

## 2019-09-19 DIAGNOSIS — I499 Cardiac arrhythmia, unspecified: Secondary | ICD-10-CM | POA: Diagnosis not present

## 2019-09-19 DIAGNOSIS — M545 Low back pain: Secondary | ICD-10-CM | POA: Diagnosis not present

## 2019-09-19 DIAGNOSIS — E559 Vitamin D deficiency, unspecified: Secondary | ICD-10-CM | POA: Diagnosis not present

## 2019-09-19 DIAGNOSIS — Z87440 Personal history of urinary (tract) infections: Secondary | ICD-10-CM | POA: Diagnosis not present

## 2019-09-19 DIAGNOSIS — Z9181 History of falling: Secondary | ICD-10-CM | POA: Diagnosis not present

## 2019-09-19 DIAGNOSIS — I959 Hypotension, unspecified: Secondary | ICD-10-CM | POA: Diagnosis not present

## 2019-09-19 DIAGNOSIS — I10 Essential (primary) hypertension: Secondary | ICD-10-CM | POA: Diagnosis not present

## 2019-09-19 DIAGNOSIS — G20 Parkinson's disease: Secondary | ICD-10-CM | POA: Diagnosis not present

## 2019-09-24 ENCOUNTER — Other Ambulatory Visit: Payer: Self-pay

## 2019-09-24 ENCOUNTER — Ambulatory Visit
Admission: RE | Admit: 2019-09-24 | Discharge: 2019-09-24 | Disposition: A | Discharge status: Home / Self Care | Payer: Medicare HMO | Source: Ambulatory Visit | Attending: Internal Medicine | Admitting: Internal Medicine

## 2019-09-24 DIAGNOSIS — R55 Syncope and collapse: Secondary | ICD-10-CM

## 2019-09-24 DIAGNOSIS — R0609 Other forms of dyspnea: Secondary | ICD-10-CM

## 2019-09-24 DIAGNOSIS — R0600 Dyspnea, unspecified: Secondary | ICD-10-CM | POA: Insufficient documentation

## 2019-09-24 MED ORDER — TECHNETIUM TC 99M TETROFOSMIN IV KIT
30.7200 | PACK | Freq: Once | INTRAVENOUS | Status: AC | PRN
  Administered 2019-09-24: 30.72 via INTRAVENOUS

## 2019-09-24 MED ORDER — REGADENOSON 0.4 MG/5ML IV SOLN
0.4000 mg | Freq: Once | INTRAVENOUS | Status: AC
  Administered 2019-09-24: 0.4 mg via INTRAVENOUS

## 2019-09-24 MED ORDER — TECHNETIUM TC 99M TETROFOSMIN IV KIT
10.0000 | PACK | Freq: Once | INTRAVENOUS | Status: AC | PRN
  Administered 2019-09-24: 10.52 via INTRAVENOUS

## 2019-09-25 LAB — NM MYOCAR MULTI W/SPECT W/WALL MOTION / EF
LV dias vol: 55 mL (ref 62–150)
LV sys vol: 27 mL
Peak HR: 101 {beats}/min
Percent HR: 69 %
Rest HR: 101 {beats}/min
SDS: 3
SRS: 0
SSS: 0
TID: 0.94

## 2019-09-26 DIAGNOSIS — I10 Essential (primary) hypertension: Secondary | ICD-10-CM | POA: Diagnosis not present

## 2019-09-26 DIAGNOSIS — M545 Low back pain: Secondary | ICD-10-CM

## 2019-09-26 DIAGNOSIS — Z79891 Long term (current) use of opiate analgesic: Secondary | ICD-10-CM

## 2019-09-26 DIAGNOSIS — I499 Cardiac arrhythmia, unspecified: Secondary | ICD-10-CM | POA: Diagnosis not present

## 2019-09-26 DIAGNOSIS — I959 Hypotension, unspecified: Secondary | ICD-10-CM | POA: Diagnosis not present

## 2019-09-26 DIAGNOSIS — N400 Enlarged prostate without lower urinary tract symptoms: Secondary | ICD-10-CM | POA: Diagnosis not present

## 2019-09-26 DIAGNOSIS — Z87440 Personal history of urinary (tract) infections: Secondary | ICD-10-CM

## 2019-09-26 DIAGNOSIS — Z9181 History of falling: Secondary | ICD-10-CM

## 2019-09-26 DIAGNOSIS — E559 Vitamin D deficiency, unspecified: Secondary | ICD-10-CM

## 2019-09-26 DIAGNOSIS — R32 Unspecified urinary incontinence: Secondary | ICD-10-CM

## 2019-09-26 DIAGNOSIS — G20 Parkinson's disease: Secondary | ICD-10-CM | POA: Diagnosis not present

## 2019-10-04 DIAGNOSIS — I499 Cardiac arrhythmia, unspecified: Secondary | ICD-10-CM | POA: Diagnosis not present

## 2019-10-04 DIAGNOSIS — I959 Hypotension, unspecified: Secondary | ICD-10-CM | POA: Diagnosis not present

## 2019-10-04 DIAGNOSIS — Z87440 Personal history of urinary (tract) infections: Secondary | ICD-10-CM | POA: Diagnosis not present

## 2019-10-04 DIAGNOSIS — M545 Low back pain: Secondary | ICD-10-CM | POA: Diagnosis not present

## 2019-10-04 DIAGNOSIS — N400 Enlarged prostate without lower urinary tract symptoms: Secondary | ICD-10-CM | POA: Diagnosis not present

## 2019-10-04 DIAGNOSIS — G20 Parkinson's disease: Secondary | ICD-10-CM | POA: Diagnosis not present

## 2019-10-04 DIAGNOSIS — Z9181 History of falling: Secondary | ICD-10-CM | POA: Diagnosis not present

## 2019-10-04 DIAGNOSIS — I10 Essential (primary) hypertension: Secondary | ICD-10-CM | POA: Diagnosis not present

## 2019-10-04 DIAGNOSIS — E559 Vitamin D deficiency, unspecified: Secondary | ICD-10-CM | POA: Diagnosis not present

## 2019-10-05 DIAGNOSIS — G20 Parkinson's disease: Secondary | ICD-10-CM | POA: Diagnosis not present

## 2019-10-05 DIAGNOSIS — N400 Enlarged prostate without lower urinary tract symptoms: Secondary | ICD-10-CM | POA: Diagnosis not present

## 2019-10-05 DIAGNOSIS — I499 Cardiac arrhythmia, unspecified: Secondary | ICD-10-CM | POA: Diagnosis not present

## 2019-10-05 DIAGNOSIS — Z9181 History of falling: Secondary | ICD-10-CM | POA: Diagnosis not present

## 2019-10-05 DIAGNOSIS — I10 Essential (primary) hypertension: Secondary | ICD-10-CM | POA: Diagnosis not present

## 2019-10-05 DIAGNOSIS — I959 Hypotension, unspecified: Secondary | ICD-10-CM | POA: Diagnosis not present

## 2019-10-05 DIAGNOSIS — E559 Vitamin D deficiency, unspecified: Secondary | ICD-10-CM | POA: Diagnosis not present

## 2019-10-05 DIAGNOSIS — M545 Low back pain: Secondary | ICD-10-CM | POA: Diagnosis not present

## 2019-10-05 DIAGNOSIS — Z87440 Personal history of urinary (tract) infections: Secondary | ICD-10-CM | POA: Diagnosis not present

## 2019-10-08 DIAGNOSIS — I10 Essential (primary) hypertension: Secondary | ICD-10-CM | POA: Diagnosis not present

## 2019-10-08 DIAGNOSIS — Z9181 History of falling: Secondary | ICD-10-CM | POA: Diagnosis not present

## 2019-10-08 DIAGNOSIS — Z87440 Personal history of urinary (tract) infections: Secondary | ICD-10-CM | POA: Diagnosis not present

## 2019-10-08 DIAGNOSIS — E559 Vitamin D deficiency, unspecified: Secondary | ICD-10-CM | POA: Diagnosis not present

## 2019-10-08 DIAGNOSIS — G20 Parkinson's disease: Secondary | ICD-10-CM | POA: Diagnosis not present

## 2019-10-08 DIAGNOSIS — I959 Hypotension, unspecified: Secondary | ICD-10-CM | POA: Diagnosis not present

## 2019-10-08 DIAGNOSIS — N400 Enlarged prostate without lower urinary tract symptoms: Secondary | ICD-10-CM | POA: Diagnosis not present

## 2019-10-08 DIAGNOSIS — M545 Low back pain: Secondary | ICD-10-CM | POA: Diagnosis not present

## 2019-10-08 DIAGNOSIS — I499 Cardiac arrhythmia, unspecified: Secondary | ICD-10-CM | POA: Diagnosis not present

## 2019-10-12 ENCOUNTER — Ambulatory Visit (INDEPENDENT_AMBULATORY_CARE_PROVIDER_SITE_OTHER): Payer: Medicare HMO | Admitting: Internal Medicine

## 2019-10-12 ENCOUNTER — Other Ambulatory Visit: Payer: Self-pay

## 2019-10-12 ENCOUNTER — Encounter: Payer: Self-pay | Admitting: Internal Medicine

## 2019-10-12 VITALS — BP 122/80 | HR 78 | Ht 70.0 in | Wt 184.0 lb

## 2019-10-12 DIAGNOSIS — I959 Hypotension, unspecified: Secondary | ICD-10-CM | POA: Diagnosis not present

## 2019-10-12 DIAGNOSIS — M545 Low back pain: Secondary | ICD-10-CM | POA: Diagnosis not present

## 2019-10-12 DIAGNOSIS — R55 Syncope and collapse: Secondary | ICD-10-CM | POA: Diagnosis not present

## 2019-10-12 DIAGNOSIS — N400 Enlarged prostate without lower urinary tract symptoms: Secondary | ICD-10-CM | POA: Diagnosis not present

## 2019-10-12 DIAGNOSIS — Z9181 History of falling: Secondary | ICD-10-CM | POA: Diagnosis not present

## 2019-10-12 DIAGNOSIS — I471 Supraventricular tachycardia: Secondary | ICD-10-CM | POA: Diagnosis not present

## 2019-10-12 DIAGNOSIS — I10 Essential (primary) hypertension: Secondary | ICD-10-CM

## 2019-10-12 DIAGNOSIS — I499 Cardiac arrhythmia, unspecified: Secondary | ICD-10-CM | POA: Diagnosis not present

## 2019-10-12 DIAGNOSIS — E559 Vitamin D deficiency, unspecified: Secondary | ICD-10-CM | POA: Diagnosis not present

## 2019-10-12 DIAGNOSIS — Z87440 Personal history of urinary (tract) infections: Secondary | ICD-10-CM | POA: Diagnosis not present

## 2019-10-12 DIAGNOSIS — G20 Parkinson's disease: Secondary | ICD-10-CM | POA: Diagnosis not present

## 2019-10-12 NOTE — Progress Notes (Signed)
Follow-up Outpatient Visit Date: 10/12/2019  Primary Care Provider: [name], [place] [address]  Chief Complaint: Follow-up syncope  HPI:  Mr. Lownes is a 74 y.o. male with history of hypertension, Parkinson's disease, and kidney injury, who presents for follow-up of syncope.  I last saw Mr Mcelhone a month ago, at which time he continued to have intermittent weakness.  His wife reported 14 "episodes" over the preceding month during which she would become transiently unresponsive when seated (including when wearing event monitor).  Transient Mobitz type I second-degree AV block with up to a 2.1-second pause occurred.  Subsequent myocardial perfusion stress test was normal.  Today, Mr. Mcaree reports feeling weak but is otherwise without complaints his wife provides most of the history.  Mr. Reeser has only had one "episode" during which he was transiently unresponsive since our last visit.  As in the past, it lasted a few seconds and occurred while he was seated.  He has not fallen.  He is working with PT.  He has not had any chest pain or shortness of breath.  --------------------------------------------------------------------------------------------------  Cardiovascular History & Procedures: Cardiovascular Problems:  Syncope  PSVT  Transient Mobitz type I second-degree AV block  Risk Factors:  Male gender, age greater than 12  Cath/PCI:  None  CV Surgery:  None  EP Procedures and Devices:  14-day event monitor (07/30/2019): Predominantly sinus rhythm with rare PACs and PVCs as well as brief episodes of PSVT. Single episode of Mobitz type I second-degree AV block occurred with nonconducted P wave leading to 2.1-second ventricular pause.  Non-Invasive Evaluation(s):  TTE (05/19/2019): LVEF 55-60% with normal wall thickness. Normal RV size and function. No significant valvular abnormality.  Recent CV Pertinent Labs: Lab  Results  Component Value Date   CHOL 174 05/03/2019   HDL 48.80 05/03/2019   LDLCALC 102 (H) 05/03/2019   TRIG 118.0 05/03/2019   CHOLHDL 4 05/03/2019   K 4.1 08/31/2019   MG 1.9 05/14/2019   BUN 21 08/31/2019   CREATININE 1.58 (H) 08/31/2019   CREATININE 1.08 01/26/2019    Past medical and surgical history were reviewed and updated in EPIC.  Current Meds  Medication Sig  . amLODipine (NORVASC) 2.5 MG tablet Take 1 tablet (2.5 mg total) by mouth daily.  Marland Kitchen aspirin EC 81 MG tablet Take 81 mg by mouth every other day.  . carbidopa-levodopa (SINEMET IR) 25-100 MG tablet TAKE 2 TABLETS AT 7AM, 2 TABLETS AT NOON AND  1 TABLET AT 4PM  . Cholecalciferol (VITAMIN D) 50 MCG (2000 UT) CAPS Take 1 capsule (2,000 Units total) by mouth daily.  . finasteride (PROSCAR) 5 MG tablet Take 1 tablet (5 mg total) by mouth daily.  . Multiple Vitamins-Minerals (MULTIVITAMIN PO) Take 1 tablet by mouth daily.  . polyethylene glycol (MIRALAX / GLYCOLAX) 17 g packet Take 17 g by mouth daily.  . sertraline (ZOLOFT) 25 MG tablet Take 1 tablet (25 mg total) by mouth daily.    Allergies: Penicillins  Social History   Tobacco Use  . Smoking status: Former Smoker    Quit date: 02/01/1986    Years since quitting: 33.7  . Smokeless tobacco: Never Used  Substance Use Topics  . Alcohol use: No    Alcohol/week: 0.0 standard drinks  . Drug use: No    Family History  Problem Relation Age of Onset  . Kidney disease Mother   . Hypertension Mother   . Arthritis Father   .  Healthy Son   . Diabetes Sister   . Cancer Neg Hx   . Coronary artery disease Neg Hx   . Stroke Neg Hx   . Hyperlipidemia Neg Hx     Review of Systems: A 12-system review of systems was performed and was negative except as noted in the HPI.  --------------------------------------------------------------------------------------------------  Physical Exam: BP 122/80 (BP Location: Left Arm, Patient Position: Sitting, Cuff Size:  Normal)   Pulse 78   Ht 5\' 10"  (1.778 m)   Wt 184 lb (83.5 kg)   SpO2 97%   BMI 26.40 kg/m   General:  NAD.  Seated in a wheelchair. Lungs: CTA bilaterally. Heart: RRR w/o murmurs. Ext: Trace pretibial edema.  EKG:  Normal sinus rhythm with nonspecific ST segment changes.  Early R wave transition noted on 09/12/2019 is no longer present.  Lab Results  Component Value Date   WBC 9.9 08/31/2019   HGB 11.3 (L) 08/31/2019   HCT 35.0 (L) 08/31/2019   MCV 82.9 08/31/2019   PLT 353.0 08/31/2019    Lab Results  Component Value Date   NA 141 08/31/2019   K 4.1 08/31/2019   CL 106 08/31/2019   CO2 29 08/31/2019   BUN 21 08/31/2019   CREATININE 1.58 (H) 08/31/2019   GLUCOSE 117 (H) 08/31/2019   ALT 2 08/31/2019    Lab Results  Component Value Date   CHOL 174 05/03/2019   HDL 48.80 05/03/2019   LDLCALC 102 (H) 05/03/2019   TRIG 118.0 05/03/2019   CHOLHDL 4 05/03/2019    --------------------------------------------------------------------------------------------------  ASSESSMENT AND PLAN: Syncope: Mr. Bry has only had one "episode" of transient unresponsiveness since our last visit.  His wife feels like he has overall been doing better since metoprolol was stopped at our last visit.  Workup this far has been unrevealing other than short periods of Mobitz type 1 second degree AV block with pauses of 2.1 seconds (he was still on metoprolol at the time).  We have agreed to defer additional testing and interventions at this time.  I encouraged Mr. Krell to stay well-hydrated.  He should continue to follow with his neurologist regarding management of his Parkinson's disease.  PSVT: No palpitations reported.  I doubt that transient unresponsiveness is related to brief runs of PSVT.  Metoprolol on hold, as above, given brief pause in the setting of Mobitz type 1 second degree AV block.  Hypertension: Blood pressure is reasonably well-controlled.  We will continue with  low-dose amlodipine.  Follow-up: Return to clinic in 6 months.  Nelva Bush, MD 10/12/2019 1:55 PM

## 2019-10-12 NOTE — Patient Instructions (Signed)
Medication Instructions:  Your physician recommends that you continue on your current medications as directed. Please refer to the Current Medication list given to you today.  *If you need a refill on your cardiac medications before your next appointment, please call your pharmacy*   Follow-Up: At [facility], you and your health needs are our priority.  As part of our continuing mission to provide you with exceptional heart care, we have created designated Provider Care Teams.  These Care Teams include your primary Cardiologist (physician) and Advanced Practice Providers (APPs -  Physician Assistants and Nurse Practitioners) who all work together to provide you with the care you need, when you need it.  We recommend signing up for the patient portal called "MyChart".  Sign up information is provided on this After Visit Summary.  MyChart is used to connect with patients for Virtual Visits (Telemedicine).  Patients are able to view lab/test results, encounter notes, upcoming appointments, etc.  Non-urgent messages can be sent to your provider as well.   To learn more about what you can do with MyChart, go to [email or web address].    Your next appointment:   6 month(s)  Ok to have his wife as a Counselling psychologist at appointment.   The format for your next appointment:   In Person  Provider:    You may see DR Harrell Gave END or one of the following Advanced Practice Providers on your designated Care Team:    Murray Hodgkins, NP  Christell Faith, PA-C  Marrianne Mood, PA-C

## 2019-10-13 ENCOUNTER — Encounter: Payer: Self-pay | Admitting: Internal Medicine

## 2019-10-16 DIAGNOSIS — N400 Enlarged prostate without lower urinary tract symptoms: Secondary | ICD-10-CM | POA: Diagnosis not present

## 2019-10-16 DIAGNOSIS — I499 Cardiac arrhythmia, unspecified: Secondary | ICD-10-CM | POA: Diagnosis not present

## 2019-10-16 DIAGNOSIS — E559 Vitamin D deficiency, unspecified: Secondary | ICD-10-CM | POA: Diagnosis not present

## 2019-10-16 DIAGNOSIS — G20 Parkinson's disease: Secondary | ICD-10-CM | POA: Diagnosis not present

## 2019-10-16 DIAGNOSIS — I10 Essential (primary) hypertension: Secondary | ICD-10-CM | POA: Diagnosis not present

## 2019-10-16 DIAGNOSIS — M545 Low back pain: Secondary | ICD-10-CM | POA: Diagnosis not present

## 2019-10-16 DIAGNOSIS — I959 Hypotension, unspecified: Secondary | ICD-10-CM | POA: Diagnosis not present

## 2019-10-16 DIAGNOSIS — Z9181 History of falling: Secondary | ICD-10-CM | POA: Diagnosis not present

## 2019-10-16 DIAGNOSIS — Z87440 Personal history of urinary (tract) infections: Secondary | ICD-10-CM | POA: Diagnosis not present

## 2019-10-17 DIAGNOSIS — I499 Cardiac arrhythmia, unspecified: Secondary | ICD-10-CM | POA: Diagnosis not present

## 2019-10-17 DIAGNOSIS — G20 Parkinson's disease: Secondary | ICD-10-CM | POA: Diagnosis not present

## 2019-10-17 DIAGNOSIS — M545 Low back pain: Secondary | ICD-10-CM | POA: Diagnosis not present

## 2019-10-17 DIAGNOSIS — E559 Vitamin D deficiency, unspecified: Secondary | ICD-10-CM | POA: Diagnosis not present

## 2019-10-17 DIAGNOSIS — N400 Enlarged prostate without lower urinary tract symptoms: Secondary | ICD-10-CM | POA: Diagnosis not present

## 2019-10-17 DIAGNOSIS — I10 Essential (primary) hypertension: Secondary | ICD-10-CM | POA: Diagnosis not present

## 2019-10-17 DIAGNOSIS — Z9181 History of falling: Secondary | ICD-10-CM | POA: Diagnosis not present

## 2019-10-17 DIAGNOSIS — Z87440 Personal history of urinary (tract) infections: Secondary | ICD-10-CM | POA: Diagnosis not present

## 2019-10-17 DIAGNOSIS — I959 Hypotension, unspecified: Secondary | ICD-10-CM | POA: Diagnosis not present

## 2019-10-22 DIAGNOSIS — I10 Essential (primary) hypertension: Secondary | ICD-10-CM | POA: Diagnosis not present

## 2019-10-22 DIAGNOSIS — G20 Parkinson's disease: Secondary | ICD-10-CM | POA: Diagnosis not present

## 2019-10-22 DIAGNOSIS — I499 Cardiac arrhythmia, unspecified: Secondary | ICD-10-CM | POA: Diagnosis not present

## 2019-10-22 DIAGNOSIS — E559 Vitamin D deficiency, unspecified: Secondary | ICD-10-CM | POA: Diagnosis not present

## 2019-10-22 DIAGNOSIS — Z9181 History of falling: Secondary | ICD-10-CM | POA: Diagnosis not present

## 2019-10-22 DIAGNOSIS — I959 Hypotension, unspecified: Secondary | ICD-10-CM | POA: Diagnosis not present

## 2019-10-22 DIAGNOSIS — M545 Low back pain: Secondary | ICD-10-CM | POA: Diagnosis not present

## 2019-10-22 DIAGNOSIS — N400 Enlarged prostate without lower urinary tract symptoms: Secondary | ICD-10-CM | POA: Diagnosis not present

## 2019-10-22 DIAGNOSIS — Z87440 Personal history of urinary (tract) infections: Secondary | ICD-10-CM | POA: Diagnosis not present

## 2019-10-23 ENCOUNTER — Other Ambulatory Visit: Payer: Self-pay | Admitting: Family Medicine

## 2019-10-23 DIAGNOSIS — E538 Deficiency of other specified B group vitamins: Secondary | ICD-10-CM

## 2019-10-23 DIAGNOSIS — I10 Essential (primary) hypertension: Secondary | ICD-10-CM

## 2019-10-23 DIAGNOSIS — N289 Disorder of kidney and ureter, unspecified: Secondary | ICD-10-CM

## 2019-10-23 DIAGNOSIS — E559 Vitamin D deficiency, unspecified: Secondary | ICD-10-CM

## 2019-10-23 DIAGNOSIS — N401 Enlarged prostate with lower urinary tract symptoms: Secondary | ICD-10-CM

## 2019-10-23 DIAGNOSIS — R351 Nocturia: Secondary | ICD-10-CM

## 2019-10-24 ENCOUNTER — Other Ambulatory Visit (INDEPENDENT_AMBULATORY_CARE_PROVIDER_SITE_OTHER): Payer: Medicare HMO

## 2019-10-24 ENCOUNTER — Ambulatory Visit (INDEPENDENT_AMBULATORY_CARE_PROVIDER_SITE_OTHER): Payer: Medicare HMO

## 2019-10-24 ENCOUNTER — Other Ambulatory Visit: Payer: Self-pay

## 2019-10-24 VITALS — BP 148/80 | Wt 184.0 lb

## 2019-10-24 DIAGNOSIS — Z125 Encounter for screening for malignant neoplasm of prostate: Secondary | ICD-10-CM | POA: Diagnosis not present

## 2019-10-24 DIAGNOSIS — N401 Enlarged prostate with lower urinary tract symptoms: Secondary | ICD-10-CM | POA: Diagnosis not present

## 2019-10-24 DIAGNOSIS — N289 Disorder of kidney and ureter, unspecified: Secondary | ICD-10-CM | POA: Diagnosis not present

## 2019-10-24 DIAGNOSIS — Z0000 Encounter for general adult medical examination without abnormal findings: Secondary | ICD-10-CM | POA: Diagnosis not present

## 2019-10-24 DIAGNOSIS — R351 Nocturia: Secondary | ICD-10-CM | POA: Diagnosis not present

## 2019-10-24 DIAGNOSIS — E559 Vitamin D deficiency, unspecified: Secondary | ICD-10-CM

## 2019-10-24 DIAGNOSIS — E538 Deficiency of other specified B group vitamins: Secondary | ICD-10-CM | POA: Diagnosis not present

## 2019-10-24 LAB — PSA, MEDICARE: PSA: 1.73 ng/ml (ref 0.10–4.00)

## 2019-10-24 LAB — RENAL FUNCTION PANEL
Albumin: 3.5 g/dL (ref 3.5–5.2)
BUN: 21 mg/dL (ref 6–23)
CO2: 26 mEq/L (ref 19–32)
Calcium: 8.9 mg/dL (ref 8.4–10.5)
Chloride: 108 mEq/L (ref 96–112)
Creatinine, Ser: 1.31 mg/dL (ref 0.40–1.50)
GFR: 64.4 mL/min (ref >60.00)
Glucose, Bld: 88 mg/dL (ref 70–99)
Phosphorus: 2.9 mg/dL (ref 2.3–4.6)
Potassium: 3.6 mEq/L (ref 3.5–5.1)
Sodium: 140 mEq/L (ref 135–145)

## 2019-10-24 LAB — VITAMIN B12: Vitamin B-12: 300 pg/mL (ref 211–911)

## 2019-10-24 LAB — VITAMIN D 25 HYDROXY (VIT D DEFICIENCY, FRACTURES): VITD: 15.58 ng/mL — ABNORMAL LOW (ref 30.00–100.00)
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
## 2019-10-24 NOTE — Patient Instructions (Signed)
Mr. Brandon Manning , Thank you for taking time to come for your Medicare Wellness Visit. I appreciate your ongoing commitment to your health goals. Please review the following plan we discussed and let me know if I can assist you in the future.   Screening recommendations/referrals: Colonoscopy: declined Recommended yearly ophthalmology/optometry visit for glaucoma screening and checkup Recommended yearly dental visit for hygiene and checkup  Vaccinations: Influenza vaccine: Up to date, completed 05/19/2019 Pneumococcal vaccine: Completed series Tdap vaccine: decline Shingles vaccine: discussed    Advanced directives: Please bring a copy of your POA (Power of Attorney) and/or Living Will to your next appointment.   Conditions/risks identified: hypertension  Next appointment: 10/31/2019 @ 3:15 pm   Preventive Care 65 Years and Older, Male Preventive care refers to lifestyle choices and visits with your health care provider that can promote health and wellness. What does preventive care include?  A yearly physical exam. This is also called an annual well check.  Dental exams once or twice a year.  Routine eye exams. Ask your health care provider how often you should have your eyes checked.  Personal lifestyle choices, including:  Daily care of your teeth and gums.  Regular physical activity.  Eating a healthy diet.  Avoiding tobacco and drug use.  Limiting alcohol use.  Practicing safe sex.  Taking low doses of aspirin every day.  Taking vitamin and mineral supplements as recommended by your health care provider. What happens during an annual well check? The services and screenings done by your health care provider during your annual well check will depend on your age, overall health, lifestyle risk factors, and family history of disease. Counseling  Your health care provider may ask you questions about your:  Alcohol use.  Tobacco use.  Drug use.  Emotional  well-being.  Home and relationship well-being.  Sexual activity.  Eating habits.  History of falls.  Memory and ability to understand (cognition).  Work and work Statistician. Screening  You may have the following tests or measurements:  Height, weight, and BMI.  Blood pressure.  Lipid and cholesterol levels. These may be checked every 5 years, or more frequently if you are over 106 years old.  Skin check.  Lung cancer screening. You may have this screening every year starting at age 2 if you have a 30-pack-year history of smoking and currently smoke or have quit within the past 15 years.  Fecal occult blood test (FOBT) of the stool. You may have this test every year starting at age 9.  Flexible sigmoidoscopy or colonoscopy. You may have a sigmoidoscopy every 5 years or a colonoscopy every 10 years starting at age 5.  Prostate cancer screening. Recommendations will vary depending on your family history and other risks.  Hepatitis C blood test.  Hepatitis B blood test.  Sexually transmitted disease (STD) testing.  Diabetes screening. This is done by checking your blood sugar (glucose) after you have not eaten for a while (fasting). You may have this done every 1-3 years.  Abdominal aortic aneurysm (AAA) screening. You may need this if you are a current or former smoker.  Osteoporosis. You may be screened starting at age 42 if you are at high risk. Talk with your health care provider about your test results, treatment options, and if necessary, the need for more tests. Vaccines  Your health care provider may recommend certain vaccines, such as:  Influenza vaccine. This is recommended every year.  Tetanus, diphtheria, and acellular pertussis (Tdap, Td) vaccine. You  may need a Td booster every 10 years.  Zoster vaccine. You may need this after age 23.  Pneumococcal 13-valent conjugate (PCV13) vaccine. One dose is recommended after age 76.  Pneumococcal  polysaccharide (PPSV23) vaccine. One dose is recommended after age 19. Talk to your health care provider about which screenings and vaccines you need and how often you need them. This information is not intended to replace advice given to you by your health care provider. Make sure you discuss any questions you have with your health care provider. Document Released: 08/01/2015 Document Revised: 03/24/2016 Document Reviewed: 05/06/2015 Elsevier Interactive Patient Education  2017 [place] Prevention in the Home Falls can cause injuries. They can happen to people of all ages. There are many things you can do to make your home safe and to help prevent falls. What can I do on the outside of my home?  Regularly fix the edges of walkways and driveways and fix any cracks.  Remove anything that might make you trip as you walk through a door, such as a raised step or threshold.  Trim any bushes or trees on the path to your home.  Use bright outdoor lighting.  Clear any walking paths of anything that might make someone trip, such as rocks or tools.  Regularly check to see if handrails are loose or broken. Make sure that both sides of any steps have handrails.  Any raised decks and porches should have guardrails on the edges.  Have any leaves, snow, or ice cleared regularly.  Use sand or salt on walking paths during winter.  Clean up any spills in your garage right away. This includes oil or grease spills. What can I do in the bathroom?  Use night lights.  Install grab bars by the toilet and in the tub and shower. Do not use towel bars as grab bars.  Use non-skid mats or decals in the tub or shower.  If you need to sit down in the shower, use a plastic, non-slip stool.  Keep the floor dry. Clean up any water that spills on the floor as soon as it happens.  Remove soap buildup in the tub or shower regularly.  Attach bath mats securely with double-sided non-slip rug  tape.  Do not have throw rugs and other things on the floor that can make you trip. What can I do in the bedroom?  Use night lights.  Make sure that you have a light by your bed that is easy to reach.  Do not use any sheets or blankets that are too big for your bed. They should not hang down onto the floor.  Have a firm chair that has side arms. You can use this for support while you get dressed.  Do not have throw rugs and other things on the floor that can make you trip. What can I do in the kitchen?  Clean up any spills right away.  Avoid walking on wet floors.  Keep items that you use a lot in easy-to-reach places.  If you need to reach something above you, use a strong step stool that has a grab bar.  Keep electrical cords out of the way.  Do not use floor polish or wax that makes floors slippery. If you must use wax, use non-skid floor wax.  Do not have throw rugs and other things on the floor that can make you trip. What can I do with my stairs?  Do not leave any items  on the stairs.  Make sure that there are handrails on both sides of the stairs and use them. Fix handrails that are broken or loose. Make sure that handrails are as long as the stairways.  Check any carpeting to make sure that it is firmly attached to the stairs. Fix any carpet that is loose or worn.  Avoid having throw rugs at the top or bottom of the stairs. If you do have throw rugs, attach them to the floor with carpet tape.  Make sure that you have a light switch at the top of the stairs and the bottom of the stairs. If you do not have them, ask someone to add them for you. What else can I do to help prevent falls?  Wear shoes that:  Do not have high heels.  Have rubber bottoms.  Are comfortable and fit you well.  Are closed at the toe. Do not wear sandals.  If you use a stepladder:  Make sure that it is fully opened. Do not climb a closed stepladder.  Make sure that both sides of the  stepladder are locked into place.  Ask someone to hold it for you, if possible.  Clearly mark and make sure that you can see:  Any grab bars or handrails.  First and last steps.  Where the edge of each step is.  Use tools that help you move around (mobility aids) if they are needed. These include:  Canes.  Walkers.  Scooters.  Crutches.  Turn on the lights when you go into a dark area. Replace any light bulbs as soon as they burn out.  Set up your furniture so you have a clear path. Avoid moving your furniture around.  If any of your floors are uneven, fix them.  If there are any pets around you, be aware of where they are.  Review your medicines with your doctor. Some medicines can make you feel dizzy. This can increase your chance of falling. Ask your doctor what other things that you can do to help prevent falls. This information is not intended to replace advice given to you by your health care provider. Make sure you discuss any questions you have with your health care provider. Document Released: 05/01/2009 Document Revised: 12/11/2015 Document Reviewed: 08/09/2014 Elsevier Interactive Patient Education  2017 [employer].

## 2019-10-24 NOTE — Progress Notes (Signed)
PCP notes:  Health Maintenance: Colonoscopy- declined   Abnormal Screenings: none   Patient concerns: none   Nurse concerns: none   Next PCP appt: 10/31/2019 @ 3:15 pm

## 2019-10-29 DIAGNOSIS — M545 Low back pain: Secondary | ICD-10-CM | POA: Diagnosis not present

## 2019-10-29 DIAGNOSIS — G20 Parkinson's disease: Secondary | ICD-10-CM | POA: Diagnosis not present

## 2019-10-29 DIAGNOSIS — I959 Hypotension, unspecified: Secondary | ICD-10-CM | POA: Diagnosis not present

## 2019-10-29 DIAGNOSIS — E559 Vitamin D deficiency, unspecified: Secondary | ICD-10-CM | POA: Diagnosis not present

## 2019-10-29 DIAGNOSIS — Z9181 History of falling: Secondary | ICD-10-CM | POA: Diagnosis not present

## 2019-10-29 DIAGNOSIS — I499 Cardiac arrhythmia, unspecified: Secondary | ICD-10-CM | POA: Diagnosis not present

## 2019-10-29 DIAGNOSIS — I10 Essential (primary) hypertension: Secondary | ICD-10-CM | POA: Diagnosis not present

## 2019-10-29 DIAGNOSIS — N400 Enlarged prostate without lower urinary tract symptoms: Secondary | ICD-10-CM | POA: Diagnosis not present

## 2019-10-29 DIAGNOSIS — Z87440 Personal history of urinary (tract) infections: Secondary | ICD-10-CM | POA: Diagnosis not present

## 2019-10-31 ENCOUNTER — Ambulatory Visit (INDEPENDENT_AMBULATORY_CARE_PROVIDER_SITE_OTHER): Payer: Medicare HMO | Admitting: Family Medicine

## 2019-10-31 ENCOUNTER — Other Ambulatory Visit: Payer: Self-pay

## 2019-10-31 ENCOUNTER — Encounter: Payer: Self-pay | Admitting: Family Medicine

## 2019-10-31 VITALS — BP 116/70 | HR 77 | Temp 97.8°F | Ht 65.5 in | Wt 176.1 lb

## 2019-10-31 DIAGNOSIS — F0281 Dementia in other diseases classified elsewhere with behavioral disturbance: Secondary | ICD-10-CM

## 2019-10-31 DIAGNOSIS — K5909 Other constipation: Secondary | ICD-10-CM

## 2019-10-31 DIAGNOSIS — F02818 Dementia in other diseases classified elsewhere, unspecified severity, with other behavioral disturbance: Secondary | ICD-10-CM

## 2019-10-31 DIAGNOSIS — I10 Essential (primary) hypertension: Secondary | ICD-10-CM | POA: Diagnosis not present

## 2019-10-31 DIAGNOSIS — G909 Disorder of the autonomic nervous system, unspecified: Secondary | ICD-10-CM

## 2019-10-31 DIAGNOSIS — Z0000 Encounter for general adult medical examination without abnormal findings: Secondary | ICD-10-CM

## 2019-10-31 DIAGNOSIS — E538 Deficiency of other specified B group vitamins: Secondary | ICD-10-CM | POA: Diagnosis not present

## 2019-10-31 DIAGNOSIS — G20 Parkinson's disease: Secondary | ICD-10-CM

## 2019-10-31 DIAGNOSIS — Z1211 Encounter for screening for malignant neoplasm of colon: Secondary | ICD-10-CM | POA: Diagnosis not present

## 2019-10-31 DIAGNOSIS — I951 Orthostatic hypotension: Secondary | ICD-10-CM

## 2019-10-31 DIAGNOSIS — N401 Enlarged prostate with lower urinary tract symptoms: Secondary | ICD-10-CM | POA: Diagnosis not present

## 2019-10-31 DIAGNOSIS — D649 Anemia, unspecified: Secondary | ICD-10-CM | POA: Insufficient documentation

## 2019-10-31 DIAGNOSIS — G20A1 Parkinson's disease without dyskinesia, without mention of fluctuations: Secondary | ICD-10-CM

## 2019-10-31 DIAGNOSIS — Z7189 Other specified counseling: Secondary | ICD-10-CM | POA: Diagnosis not present

## 2019-10-31 DIAGNOSIS — R351 Nocturia: Secondary | ICD-10-CM

## 2019-10-31 DIAGNOSIS — E559 Vitamin D deficiency, unspecified: Secondary | ICD-10-CM

## 2019-10-31 MED ORDER — CYANOCOBALAMIN 500 MCG PO TABS: 500.0000 ug | ORAL_TABLET | Freq: Every day | ORAL | Status: AC

## 2019-10-31 MED ORDER — VITAMIN D3 1.25 MG (50000 UT) PO TABS: 1.0000 | ORAL_TABLET | ORAL | 1 refills | Status: AC

## 2019-10-31 NOTE — Assessment & Plan Note (Signed)
Stable period on daily miralax. Continue.

## 2019-10-31 NOTE — Progress Notes (Signed)
This visit was conducted in person.  BP 116/70 (BP Location: Left Arm, Patient Position: Sitting, Cuff Size: Normal)   Pulse 77   Temp 97.8 F (36.6 C) (Temporal)   Ht 5' 5.5" (1.664 m)   Wt 176 lb 2 oz (79.9 kg)   SpO2 97%   BMI 28.86 kg/m    CC: CPE Subjective:    Patient ID: Brandon Manning, male    DOB: 09-Nov-1945, 74 y.o.   MRN: [id number]  HPI: Brandon Manning is a 74 y.o. male presenting on 10/31/2019 for Annual Exam (Prt 2.  Pt accompanied by wife, [name]- temp 97.9.)   Saw health advisor last week for medicare wellness visit. Note reviewed.   No exam data present    Clinical Support from 10/24/2019 in [place] at [facility] Total Score  0      Fall Risk  10/24/2019 02/27/2019 08/24/2018 04/26/2018 03/02/2018  Falls in the past year? 0 0 0 No No  Number falls in past yr: 0 0 0 - -  Injury with Fall? 0 0 0 - -  Risk Factor Category  - - - - -  Risk for fall due to : Impaired balance/gait;Impaired mobility - - - -  Follow up Falls evaluation completed;Falls prevention discussed - Falls evaluation completed - -      Has seen cardiology (End) for episodes of transient unresponsiveness ?syncope - doing better since metoprolol was stopped. 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 Planned continue low dose amlodipine.   Continues having nurse aide and PT from [facility] home [facility] in [place]. Zoloft '25mg'$  started last month - with benefit. Voiding well with finasteride.   Preventative: Colon cancer screening - normal bowel movements. No blood in stool. He was unable to complete cologuard. No prior colonoscopy. Mild anemia noted. Check stool hemoccult today.  Prostate screening -DRE/PSA normal 2019, declined last year, rpt DRE/PSA today Lung cancer screening - not eligible.  Flu shot - declines  Pneumovax 2014. [id number]  COVID vaccine - ask about recommendations - rec mRNA vaccine.  Last tetanus shot 2008.    Shingles shot - discussed, declines.  Advanced directives: has at home. Wife would be medical decision maker. Will bring me copy. Seat belt use discussed Sunscreen use discussed.No changing moles on skin. 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  Diet: fruits/vegetables daily, water, occasional red meat, fish 1x/wk      Relevant past medical, surgical, family and social history reviewed and updated as indicated. Interim medical history since our last visit reviewed. Allergies and medications reviewed and updated. Outpatient Medications Prior to Visit  Medication Sig Dispense Refill  . amLODipine (NORVASC) 2.5 MG tablet Take 1 tablet (2.5 mg total) by mouth daily. 90 tablet 3  . aspirin EC 81 MG tablet Take 81 mg by mouth every other day.    . carbidopa-levodopa (SINEMET IR) 25-100 MG tablet TAKE 2 TABLETS AT 7AM, 2 TABLETS AT NOON AND  1 TABLET AT 4PM 450 tablet 2  . finasteride (PROSCAR) 5 MG tablet Take 1 tablet (5 mg total) by mouth daily. 30 tablet 6  . Multiple Vitamins-Minerals (MULTIVITAMIN PO) Take 1 tablet by  mouth daily.    . polyethylene glycol (MIRALAX / GLYCOLAX) 17 g packet Take 17 g by mouth daily. 14 each 0  . sertraline (ZOLOFT) 25 MG tablet Take 1 tablet (25 mg total) by mouth daily. 30 tablet 6  . Cholecalciferol (VITAMIN D) 50 MCG (2000 UT) CAPS Take 1 capsule (2,000 Units total) by mouth daily. 30 capsule    No facility-administered medications prior to visit.     Per HPI unless specifically indicated in ROS section below Review of Systems  Constitutional: Negative for activity change, appetite change, chills, fatigue, fever and unexpected weight change.  HENT:  Negative for hearing loss.   Eyes: Negative for visual disturbance.  Respiratory: Negative for cough, chest tightness, shortness of breath and wheezing.   Cardiovascular: Negative for chest pain, palpitations and leg swelling.  Gastrointestinal: Negative for abdominal distention, abdominal pain, blood in stool, constipation, diarrhea, nausea and vomiting.  Genitourinary: Negative for difficulty urinating and hematuria.  Musculoskeletal: Negative for arthralgias, myalgias and neck pain.  Skin: Negative for rash.  Neurological: Negative for dizziness, seizures, syncope and headaches.  Hematological: Negative for adenopathy. Does not bruise/bleed easily.  Psychiatric/Behavioral: Negative for dysphoric mood. The patient is not nervous/anxious.    Objective:    BP 116/70 (BP Location: Left Arm, Patient Position: Sitting, Cuff Size: Normal)   Pulse 77   Temp 97.8 F (36.6 C) (Temporal)   Ht 5' 5.5" (1.664 m)   Wt 176 lb 2 oz (79.9 kg)   SpO2 97%   BMI 28.86 kg/m   Wt Readings from Last 3 Encounters:  10/31/19 176 lb 2 oz (79.9 kg)  10/24/19 184 lb (83.5 kg)  10/12/19 184 lb (83.5 kg)    Physical Exam Vitals and nursing note reviewed.  Constitutional:      General: He is not in acute distress.    Appearance: Normal appearance. He is well-developed.     Comments: In wheelchair  HENT:     Head: Normocephalic and atraumatic.     Right Ear: Hearing, tympanic membrane, ear canal and external ear normal.     Left Ear: Hearing, tympanic membrane, ear canal and external ear normal.     Nose: Nose normal.     Mouth/Throat:     Pharynx: Uvula midline. No oropharyngeal exudate or posterior oropharyngeal erythema.  Eyes:     General: No scleral icterus.    Conjunctiva/sclera: Conjunctivae normal.     Pupils: Pupils are equal, round, and reactive to light.  Cardiovascular:     Rate and Rhythm: Normal rate and regular rhythm.     Pulses:          Radial pulses are 2+ on the right side and  2+ on the left side.     Heart sounds: Normal heart sounds. No murmur.  Pulmonary:     Effort: Pulmonary effort is normal. No respiratory distress.     Breath sounds: Normal breath sounds. No wheezing or rales.  Abdominal:     General: Bowel sounds are normal. There is no distension.     Palpations: Abdomen is soft. There is no mass.     Tenderness: There is no abdominal tenderness. There is no guarding or rebound.  Genitourinary:    Prostate: Enlarged (25gm). Not tender.     Rectum: Normal. No mass, tenderness, anal fissure, external hemorrhoid or internal hemorrhoid. Normal anal tone.  Musculoskeletal:        General: Normal range of motion.     Cervical back: Normal  range of motion and neck supple.  Lymphadenopathy:     Cervical: No cervical adenopathy.  Skin:    General: Skin is warm and dry.     Findings: No rash.  Neurological:     Mental Status: He is alert.     Comments:  Rigidity present Needs assistance to stand up from seated position  Psychiatric:        Mood and Affect: Mood normal.        Behavior: Behavior normal.        Thought Content: Thought content normal.        Judgment: Judgment normal.       Results for orders placed or performed in visit on 10/24/19  PSA, Medicare  Result Value Ref Range   PSA 1.73 0.10 - 4.00 ng/ml  VITAMIN D 25 Hydroxy (Vit-D Deficiency, Fractures)  Result Value Ref Range   VITD 15.58 (L) 30.00 - 100.00 ng/mL  Vitamin B12  Result Value Ref Range   Vitamin B-12 300 211 - 911 pg/mL  Renal function panel  Result Value Ref Range   Sodium 140 135 - 145 mEq/L   Potassium 3.6 3.5 - 5.1 mEq/L   Chloride 108 96 - 112 mEq/L   CO2 26 19 - 32 mEq/L   Albumin 3.5 3.5 - 5.2 g/dL   BUN 21 6 - 23 mg/dL   Creatinine, Ser 1.31 0.40 - 1.50 mg/dL   Glucose, Bld 88 70 - 99 mg/dL   Phosphorus 2.9 2.3 - 4.6 mg/dL   GFR 64.40 >60.00 mL/min   Calcium 8.9 8.4 - 10.5 mg/dL   Assessment & Plan:  This visit occurred during the SARS-CoV-2 public  health emergency.  Safety protocols were in place, including screening questions prior to the visit, additional usage of staff PPE, and extensive cleaning of exam room while observing appropriate contact time as indicated for disinfecting solutions.   Problem List Items Addressed This Visit    Vitamin D deficiency    Levels low despite daily 2000 IU replacement - will start weekly high dose 50k u replacement x 6 mo.      Vitamin B12 deficiency    Levels low normal - seems B12 fell off med list. Will restart 551mg daily.       Parkinson disease ([facility]    Appreciate neurology care. Continue sinement. [facility] [place]       Orthostatic hypotension    No recent syncope. 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 Now off metoprolol and doing better.       HTN (hypertension)    Chronic, stable on low dose amlodipine - continue  Treat standing BP's to avoid orthostatic syncope/falls.       Health maintenance examination - Primary    Preventative protocols reviewed and updated unless pt declined. Discussed healthy diet and lifestyle.       Dementia due to Parkinson's disease with behavioral disturbance ([facility]    Doing well on zoloft '25mg'$  daily - continue.       Chronic constipation    Stable period on daily miralax. Continue.       BPH (benign prostatic hyperplasia)    Stable period on finasteride- continue.      Autonomic dysfunction   Anemia    Mild, chronic over the years.  Ferritin normal 2019.  Check anemia panel next labwork.  Last colon cancer screening  seems to have been done 2012. Given PD, avoid colonoscopy. I have asked them to complete iFOB at home to evaluate for occult bleed.       Relevant Medications   vitamin B-12 (CYANOCOBALAMIN) 500 MCG tablet   Advanced care planning/counseling discussion    Advanced directives: has at home. Wife would be medical decision maker. Will  bring me copy.       Other Visit Diagnoses    Special screening for malignant neoplasms, colon       Relevant Orders   Fecal occult blood, imunochemical       Meds ordered this encounter  Medications  . vitamin B-12 (CYANOCOBALAMIN) 500 MCG tablet    Sig: Take 1 tablet (500 mcg total) by mouth daily.    Dispense:     . Cholecalciferol (VITAMIN D3) 1.25 MG (50000 UT) TABS    Sig: Take 1 tablet by mouth once a week.    Dispense:  12 tablet    Refill:  1   Orders Placed This Encounter  Procedures  . Fecal occult blood, imunochemical    Standing Status:   Future    Standing Expiration Date:   10/30/2020    Patient instructions: I recommend pfizer or moderna vaccines.  Bring me copy of your living will to update your chart.  Vitamin levels were low - start 585mg vitamin b12 daily over the counter, start 50,000 units prescription strength vitamin D once weekly for 6 months - sent to pharmacy  Return in 6 months for follow up visit.  Pass by lab to pick up stool kit.  Follow up plan: Return in about 6 months (around 05/01/2020) for follow up visit.  JRia Bush MD

## 2019-10-31 NOTE — Assessment & Plan Note (Signed)
Appreciate neurology care. Continue sinement. Now has home health PT and nurse aide a few times a week through [facility] out of [place].

## 2019-10-31 NOTE — Assessment & Plan Note (Signed)
Stable period on finasteride- continue.

## 2019-10-31 NOTE — Patient Instructions (Addendum)
I recommend pfizer or moderna vaccines.  Bring me copy of your living will to update your chart.  Vitamin levels were low - start 524mg vitamin b12 daily over the counter, start 50,000 units prescription strength vitamin D once weekly for 6 months - sent to pharmacy  Return in 6 months for follow up visit.  Pass by lab to pick up stool kit.  Health Maintenance After Age 5962After age 220 you are at a higher risk for certain long-term diseases and infections as well as injuries from falls. Falls are a major cause of broken bones and head injuries in people who are older than age 260 Getting regular preventive care can help to keep you healthy and well. Preventive care includes getting regular testing and making lifestyle changes as recommended by your health care provider. Talk with your health care provider about:  Which screenings and tests you should have. A screening is a test that checks for a disease when you have no symptoms.  A diet and exercise plan that is right for you. What should I know about screenings and tests to prevent falls? Screening and testing are the best ways to find a health problem early. Early diagnosis and treatment give you the best chance of managing medical conditions that are common after age 269 Certain conditions and lifestyle choices may make you more likely to have a fall. Your health care provider may recommend:  Regular vision checks. Poor vision and conditions such as cataracts can make you more likely to have a fall. If you wear glasses, make sure to get your prescription updated if your vision changes.  Medicine review. Work with your health care provider to regularly review all of the medicines you are taking, including over-the-counter medicines. Ask your health care provider about any side effects that may make you more likely to have a fall. Tell your health care provider if any medicines that you take make you feel dizzy or sleepy.  Osteoporosis  screening. Osteoporosis is a condition that causes the bones to get weaker. This can make the bones weak and cause them to break more easily.  Blood pressure screening. Blood pressure changes and medicines to control blood pressure can make you feel dizzy.  Strength and balance checks. Your health care provider may recommend certain tests to check your strength and balance while standing, walking, or changing positions.  Foot health exam. Foot pain and numbness, as well as not wearing proper footwear, can make you more likely to have a fall.  Depression screening. You may be more likely to have a fall if you have a fear of falling, feel emotionally low, or feel unable to do activities that you used to do.  Alcohol use screening. Using too much alcohol can affect your balance and may make you more likely to have a fall. What actions can I take to lower my risk of falls? General instructions  Talk with your health care provider about your risks for falling. Tell your health care provider if: ? You fall. Be sure to tell your health care provider about all falls, even ones that seem minor. ? You feel dizzy, sleepy, or off-balance.  Take over-the-counter and prescription medicines only as told by your health care provider. These include any supplements.  Eat a healthy diet and maintain a healthy weight. A healthy diet includes low-fat dairy products, low-fat (lean) meats, and fiber from whole grains, beans, and lots of fruits and vegetables. Home safety  Remove any  tripping hazards, such as rugs, cords, and clutter.  Install safety equipment such as grab bars in bathrooms and safety rails on stairs.  Keep rooms and walkways well-lit. Activity   Follow a regular exercise program to stay fit. This will help you maintain your balance. Ask your health care provider what types of exercise are appropriate for you.  If you need a cane or walker, use it as recommended by your health care  provider.  Wear supportive shoes that have nonskid soles. Lifestyle  Do not drink alcohol if your health care provider tells you not to drink.  If you drink alcohol, limit how much you have: ? 0-1 drink a day for women. ? 0-2 drinks a day for men.  Be aware of how much alcohol is in your drink. In the [place] one drink equals one typical bottle of beer (12 oz), one-half glass of wine (5 oz), or one shot of hard liquor (1 oz).  Do not use any products that contain nicotine or tobacco, such as cigarettes and e-cigarettes. If you need help quitting, ask your health care provider. Summary  Having a healthy lifestyle and getting preventive care can help to protect your health and wellness after age 96.  Screening and testing are the best way to find a health problem early and help you avoid having a fall. Early diagnosis and treatment give you the best chance for managing medical conditions that are more common for people who are older than age 70.  Falls are a major cause of broken bones and head injuries in people who are older than age 20. Take precautions to prevent a fall at home.  Work with your health care provider to learn what changes you can make to improve your health and wellness and to prevent falls. This information is not intended to replace advice given to you by your health care provider. Make sure you discuss any questions you have with your health care provider. Document Revised: 10/26/2018 Document Reviewed: 05/18/2017 Elsevier Patient Education  2020 [employer].

## 2019-10-31 NOTE — Assessment & Plan Note (Signed)
Levels low normal - seems B12 fell off med list. Will restart 566mcg daily.

## 2019-10-31 NOTE — Assessment & Plan Note (Signed)
Levels low despite daily 2000 IU replacement - will start weekly high dose 50k u replacement x 6 mo.

## 2019-10-31 NOTE — Assessment & Plan Note (Signed)
Doing well on zoloft 25mg  daily - continue.

## 2019-10-31 NOTE — Assessment & Plan Note (Addendum)
Chronic, stable on low dose amlodipine - continue  Treat standing BP's to avoid orthostatic syncope/falls.

## 2019-10-31 NOTE — Assessment & Plan Note (Signed)
No recent syncope. 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 Now off metoprolol and doing better.

## 2019-10-31 NOTE — Assessment & Plan Note (Signed)
Mild, chronic over the years.  Ferritin normal 2019.  Check anemia panel next labwork.  Last colon cancer screening seems to have been done 2012. Given PD, avoid colonoscopy. I have asked them to complete iFOB at home to evaluate for occult bleed.

## 2019-10-31 NOTE — Assessment & Plan Note (Signed)
Advanced directives: has at home. Wife would be medical decision maker. Will bring me copy.

## 2019-10-31 NOTE — Assessment & Plan Note (Signed)
Preventative protocols reviewed and updated unless pt declined. Discussed healthy diet and lifestyle.  

## 2019-11-01 DIAGNOSIS — Z87440 Personal history of urinary (tract) infections: Secondary | ICD-10-CM | POA: Diagnosis not present

## 2019-11-01 DIAGNOSIS — M545 Low back pain: Secondary | ICD-10-CM | POA: Diagnosis not present

## 2019-11-01 DIAGNOSIS — I959 Hypotension, unspecified: Secondary | ICD-10-CM | POA: Diagnosis not present

## 2019-11-01 DIAGNOSIS — N400 Enlarged prostate without lower urinary tract symptoms: Secondary | ICD-10-CM | POA: Diagnosis not present

## 2019-11-01 DIAGNOSIS — I10 Essential (primary) hypertension: Secondary | ICD-10-CM | POA: Diagnosis not present

## 2019-11-01 DIAGNOSIS — E559 Vitamin D deficiency, unspecified: Secondary | ICD-10-CM | POA: Diagnosis not present

## 2019-11-01 DIAGNOSIS — Z9181 History of falling: Secondary | ICD-10-CM | POA: Diagnosis not present

## 2019-11-01 DIAGNOSIS — G20 Parkinson's disease: Secondary | ICD-10-CM | POA: Diagnosis not present

## 2019-11-01 DIAGNOSIS — I499 Cardiac arrhythmia, unspecified: Secondary | ICD-10-CM | POA: Diagnosis not present

## 2019-11-08 ENCOUNTER — Other Ambulatory Visit (INDEPENDENT_AMBULATORY_CARE_PROVIDER_SITE_OTHER): Payer: Medicare HMO

## 2019-11-08 ENCOUNTER — Telehealth: Payer: Self-pay | Admitting: Radiology

## 2019-11-08 DIAGNOSIS — G20 Parkinson's disease: Secondary | ICD-10-CM

## 2019-11-08 DIAGNOSIS — R195 Other fecal abnormalities: Secondary | ICD-10-CM

## 2019-11-08 DIAGNOSIS — Z1211 Encounter for screening for malignant neoplasm of colon: Secondary | ICD-10-CM | POA: Diagnosis not present

## 2019-11-08 LAB — FECAL OCCULT BLOOD, IMMUNOCHEMICAL: Fecal Occult Bld: POSITIVE — AB

## 2019-11-08 NOTE — Telephone Encounter (Signed)
plz call pt/wife - stool test returned positive for blood. With anemia and possible weight loss I think next step is GI evaluation. I would like to have them see the GI doctor to at least discuss available options for further evaluation. Referral placed.

## 2019-11-08 NOTE — Telephone Encounter (Signed)
Spoke with pt's wife, [name] (on dpr), relaying Dr. Synthia Innocent message.  Verbalizes understanding.

## 2019-11-08 NOTE — Telephone Encounter (Signed)
Lab called a POSITIVE ifob, results given to Dr [name]

## 2019-11-09 ENCOUNTER — Other Ambulatory Visit: Payer: Self-pay

## 2019-11-09 MED ORDER — SERTRALINE HCL 25 MG PO TABS: 25.0000 mg | ORAL_TABLET | Freq: Every day | ORAL | 1 refills | Status: AC

## 2019-11-09 MED ORDER — FINASTERIDE 5 MG PO TABS: 5.0000 mg | ORAL_TABLET | Freq: Every day | ORAL | 3 refills | Status: AC

## 2019-11-09 MED ORDER — AMLODIPINE BESYLATE 2.5 MG PO TABS: 2.5000 mg | ORAL_TABLET | Freq: Every day | ORAL | 3 refills | Status: AC

## 2019-11-09 NOTE — Telephone Encounter (Signed)
E-scribed refills.  

## 2019-11-21 ENCOUNTER — Telehealth: Payer: Self-pay

## 2019-11-21 ENCOUNTER — Inpatient Hospital Stay
Admission: EM | Admit: 2019-11-21 | Discharge: 2019-11-27 | DRG: 871 | Disposition: A | Discharge status: Skilled Nursing Facility | Payer: Medicare HMO | Attending: Internal Medicine | Admitting: Internal Medicine

## 2019-11-21 ENCOUNTER — Other Ambulatory Visit: Payer: Self-pay

## 2019-11-21 ENCOUNTER — Emergency Department: Payer: Medicare HMO

## 2019-11-21 DIAGNOSIS — Z7982 Long term (current) use of aspirin: Secondary | ICD-10-CM | POA: Diagnosis not present

## 2019-11-21 DIAGNOSIS — Z66 Do not resuscitate: Secondary | ICD-10-CM | POA: Diagnosis present

## 2019-11-21 DIAGNOSIS — J189 Pneumonia, unspecified organism: Secondary | ICD-10-CM | POA: Diagnosis present

## 2019-11-21 DIAGNOSIS — J440 Chronic obstructive pulmonary disease with acute lower respiratory infection: Secondary | ICD-10-CM | POA: Diagnosis not present

## 2019-11-21 DIAGNOSIS — D649 Anemia, unspecified: Secondary | ICD-10-CM | POA: Diagnosis present

## 2019-11-21 DIAGNOSIS — Z8261 Family history of arthritis: Secondary | ICD-10-CM

## 2019-11-21 DIAGNOSIS — I959 Hypotension, unspecified: Secondary | ICD-10-CM | POA: Diagnosis not present

## 2019-11-21 DIAGNOSIS — A4151 Sepsis due to Escherichia coli [E. coli]: Secondary | ICD-10-CM | POA: Diagnosis not present

## 2019-11-21 DIAGNOSIS — Z87891 Personal history of nicotine dependence: Secondary | ICD-10-CM

## 2019-11-21 DIAGNOSIS — N170 Acute kidney failure with tubular necrosis: Secondary | ICD-10-CM | POA: Diagnosis present

## 2019-11-21 DIAGNOSIS — N390 Urinary tract infection, site not specified: Secondary | ICD-10-CM | POA: Diagnosis present

## 2019-11-21 DIAGNOSIS — F329 Major depressive disorder, single episode, unspecified: Secondary | ICD-10-CM | POA: Diagnosis not present

## 2019-11-21 DIAGNOSIS — G20 Parkinson's disease: Secondary | ICD-10-CM | POA: Diagnosis not present

## 2019-11-21 DIAGNOSIS — I129 Hypertensive chronic kidney disease with stage 1 through stage 4 chronic kidney disease, or unspecified chronic kidney disease: Secondary | ICD-10-CM | POA: Diagnosis present

## 2019-11-21 DIAGNOSIS — F0281 Dementia in other diseases classified elsewhere with behavioral disturbance: Secondary | ICD-10-CM | POA: Diagnosis not present

## 2019-11-21 DIAGNOSIS — Z20822 Contact with and (suspected) exposure to covid-19: Secondary | ICD-10-CM | POA: Diagnosis present

## 2019-11-21 DIAGNOSIS — Z8249 Family history of ischemic heart disease and other diseases of the circulatory system: Secondary | ICD-10-CM | POA: Diagnosis not present

## 2019-11-21 DIAGNOSIS — N189 Chronic kidney disease, unspecified: Secondary | ICD-10-CM | POA: Diagnosis not present

## 2019-11-21 DIAGNOSIS — R509 Fever, unspecified: Secondary | ICD-10-CM | POA: Diagnosis not present

## 2019-11-21 DIAGNOSIS — R6520 Severe sepsis without septic shock: Secondary | ICD-10-CM | POA: Diagnosis not present

## 2019-11-21 DIAGNOSIS — Z79899 Other long term (current) drug therapy: Secondary | ICD-10-CM | POA: Diagnosis not present

## 2019-11-21 DIAGNOSIS — Z841 Family history of disorders of kidney and ureter: Secondary | ICD-10-CM | POA: Diagnosis not present

## 2019-11-21 DIAGNOSIS — N179 Acute kidney failure, unspecified: Secondary | ICD-10-CM | POA: Diagnosis not present

## 2019-11-21 DIAGNOSIS — N182 Chronic kidney disease, stage 2 (mild): Secondary | ICD-10-CM | POA: Diagnosis present

## 2019-11-21 DIAGNOSIS — Z833 Family history of diabetes mellitus: Secondary | ICD-10-CM

## 2019-11-21 DIAGNOSIS — F0280 Dementia in other diseases classified elsewhere without behavioral disturbance: Secondary | ICD-10-CM | POA: Diagnosis not present

## 2019-11-21 DIAGNOSIS — R0689 Other abnormalities of breathing: Secondary | ICD-10-CM | POA: Diagnosis not present

## 2019-11-21 DIAGNOSIS — B9620 Unspecified Escherichia coli [E. coli] as the cause of diseases classified elsewhere: Secondary | ICD-10-CM | POA: Diagnosis not present

## 2019-11-21 DIAGNOSIS — R404 Transient alteration of awareness: Secondary | ICD-10-CM | POA: Diagnosis not present

## 2019-11-21 DIAGNOSIS — R531 Weakness: Secondary | ICD-10-CM | POA: Diagnosis not present

## 2019-11-21 DIAGNOSIS — D631 Anemia in chronic kidney disease: Secondary | ICD-10-CM | POA: Diagnosis present

## 2019-11-21 DIAGNOSIS — E538 Deficiency of other specified B group vitamins: Secondary | ICD-10-CM | POA: Diagnosis not present

## 2019-11-21 DIAGNOSIS — W19XXXA Unspecified fall, initial encounter: Secondary | ICD-10-CM | POA: Diagnosis not present

## 2019-11-21 DIAGNOSIS — M2550 Pain in unspecified joint: Secondary | ICD-10-CM | POA: Diagnosis not present

## 2019-11-21 DIAGNOSIS — N400 Enlarged prostate without lower urinary tract symptoms: Secondary | ICD-10-CM | POA: Diagnosis present

## 2019-11-21 DIAGNOSIS — E860 Dehydration: Secondary | ICD-10-CM | POA: Diagnosis present

## 2019-11-21 DIAGNOSIS — E870 Hyperosmolality and hypernatremia: Secondary | ICD-10-CM | POA: Diagnosis not present

## 2019-11-21 DIAGNOSIS — I10 Essential (primary) hypertension: Secondary | ICD-10-CM | POA: Diagnosis not present

## 2019-11-21 DIAGNOSIS — Z7401 Bed confinement status: Secondary | ICD-10-CM | POA: Diagnosis not present

## 2019-11-21 DIAGNOSIS — R0902 Hypoxemia: Secondary | ICD-10-CM | POA: Diagnosis not present

## 2019-11-21 LAB — CBC WITH DIFFERENTIAL/PLATELET
Abs Immature Granulocytes: 0.24 10*3/uL — ABNORMAL HIGH (ref 0.00–0.07)
Basophils Absolute: 0 10*3/uL (ref 0.0–0.1)
Basophils Relative: 0 %
Eosinophils Absolute: 0.1 10*3/uL (ref 0.0–0.5)
Eosinophils Relative: 0 %
HCT: 32.7 % — ABNORMAL LOW (ref 39.0–52.0)
Hemoglobin: 10.8 g/dL — ABNORMAL LOW (ref 13.0–17.0)
Immature Granulocytes: 2 %
Lymphocytes Relative: 14 %
Lymphs Abs: 1.7 10*3/uL (ref 0.7–4.0)
MCH: 26.9 pg (ref 26.0–34.0)
MCHC: 33 g/dL (ref 30.0–36.0)
MCV: 81.3 fL (ref 80.0–100.0)
Monocytes Absolute: 2.1 10*3/uL — ABNORMAL HIGH (ref 0.1–1.0)
Monocytes Relative: 17 %
Neutro Abs: 8.3 10*3/uL — ABNORMAL HIGH (ref 1.7–7.7)
Neutrophils Relative %: 67 %
Platelets: 152 10*3/uL (ref 150–400)
RBC: 4.02 MIL/uL — ABNORMAL LOW (ref 4.22–5.81)
RDW: 16 % — ABNORMAL HIGH (ref 11.5–15.5)
WBC: 12.4 10*3/uL — ABNORMAL HIGH (ref 4.0–10.5)
nRBC: 0 % (ref 0.0–0.2)

## 2019-11-21 LAB — URINALYSIS, COMPLETE (UACMP) WITH MICROSCOPIC
Bilirubin Urine: NEGATIVE
Glucose, UA: NEGATIVE mg/dL
Ketones, ur: NEGATIVE mg/dL
Nitrite: POSITIVE — AB
Protein, ur: 100 mg/dL — AB
Specific Gravity, Urine: 1.017 (ref 1.005–1.030)
pH: 5 (ref 5.0–8.0)

## 2019-11-21 LAB — COMPREHENSIVE METABOLIC PANEL
ALT: 5 U/L (ref 0–44)
AST: 21 U/L (ref 15–41)
Albumin: 2.3 g/dL — ABNORMAL LOW (ref 3.5–5.0)
Alkaline Phosphatase: 72 U/L (ref 38–126)
Anion gap: 7 (ref 5–15)
BUN: 59 mg/dL — ABNORMAL HIGH (ref 8–23)
CO2: 22 mmol/L (ref 22–32)
Calcium: 8.3 mg/dL — ABNORMAL LOW (ref 8.9–10.3)
Chloride: 112 mmol/L — ABNORMAL HIGH (ref 98–111)
Creatinine, Ser: 2.53 mg/dL — ABNORMAL HIGH (ref 0.61–1.24)
GFR calc Af Amer: 28 mL/min — ABNORMAL LOW (ref >60)
GFR calc non Af Amer: 24 mL/min — ABNORMAL LOW (ref >60)
Glucose, Bld: 161 mg/dL — ABNORMAL HIGH (ref 70–99)
Potassium: 3.2 mmol/L — ABNORMAL LOW (ref 3.5–5.1)
Sodium: 141 mmol/L (ref 135–145)
Total Bilirubin: 1.2 mg/dL (ref 0.3–1.2)
Total Protein: 6.3 g/dL — ABNORMAL LOW (ref 6.5–8.1)

## 2019-11-21 LAB — RESPIRATORY PANEL BY RT PCR (FLU A&B, COVID)
Influenza A by PCR: NEGATIVE
Influenza B by PCR: NEGATIVE
SARS Coronavirus 2 by RT PCR: NEGATIVE

## 2019-11-21 LAB — LACTIC ACID, PLASMA
Lactic Acid, Venous: 1.2 mmol/L (ref 0.5–1.9)
Lactic Acid, Venous: 1.2 mmol/L (ref 0.5–1.9)

## 2019-11-21 LAB — TROPONIN I (HIGH SENSITIVITY)
Troponin I (High Sensitivity): 21 ng/L — ABNORMAL HIGH (ref <18)
Troponin I (High Sensitivity): 18 ng/L — ABNORMAL HIGH (ref <18)

## 2019-11-21 MED ORDER — SODIUM CHLORIDE 0.9 % IV SOLN
500.0000 mg | INTRAVENOUS | Status: DC
  Administered 2019-11-21: 19:00:00 500 mg via INTRAVENOUS
  Filled 2019-11-21 (×2): qty 500

## 2019-11-21 MED ORDER — CARBIDOPA-LEVODOPA 25-100 MG PO TABS
1.0000 | ORAL_TABLET | Freq: Every day | ORAL | Status: DC
  Administered 2019-11-21 – 2019-11-27 (×7): 1 via ORAL
  Filled 2019-11-21 (×7): qty 1

## 2019-11-21 MED ORDER — ACETAMINOPHEN 650 MG RE SUPP
650.0000 mg | Freq: Once | RECTAL | Status: AC
  Administered 2019-11-21: 650 mg via RECTAL
  Filled 2019-11-21: qty 1

## 2019-11-21 MED ORDER — SERTRALINE HCL 50 MG PO TABS
25.0000 mg | ORAL_TABLET | Freq: Every day | ORAL | Status: DC
  Administered 2019-11-23 – 2019-11-27 (×5): 25 mg via ORAL
  Filled 2019-11-21 (×6): qty 1

## 2019-11-21 MED ORDER — SODIUM CHLORIDE 0.9 % IV SOLN
2.0000 g | INTRAVENOUS | Status: DC
  Administered 2019-11-21: 19:00:00 2 g via INTRAVENOUS
  Filled 2019-11-21: qty 20

## 2019-11-21 MED ORDER — SODIUM CHLORIDE 0.45 % IV SOLN: INTRAVENOUS | Status: DC

## 2019-11-21 MED ORDER — SODIUM CHLORIDE 0.9 % IV BOLUS
500.0000 mL | Freq: Once | INTRAVENOUS | Status: AC
  Administered 2019-11-21: 16:00:00 500 mL via INTRAVENOUS

## 2019-11-21 MED ORDER — POLYETHYLENE GLYCOL 3350 17 G PO PACK
17.0000 g | PACK | Freq: Every day | ORAL | Status: DC
  Administered 2019-11-23 – 2019-11-27 (×4): 17 g via ORAL
  Filled 2019-11-21 (×5): qty 1

## 2019-11-21 MED ORDER — CARBIDOPA-LEVODOPA 25-100 MG PO TABS
2.0000 | ORAL_TABLET | Freq: Two times a day (BID) | ORAL | Status: DC
  Administered 2019-11-23 – 2019-11-27 (×10): 2 via ORAL
  Filled 2019-11-21 (×13): qty 2

## 2019-11-21 MED ORDER — ENOXAPARIN SODIUM 40 MG/0.4ML ~~LOC~~ SOLN
30.0000 mg | SUBCUTANEOUS | Status: DC
  Administered 2019-11-21 – 2019-11-23 (×3): 30 mg via SUBCUTANEOUS
  Filled 2019-11-21 (×3): qty 0.4

## 2019-11-21 MED ORDER — AMLODIPINE BESYLATE 5 MG PO TABS
2.5000 mg | ORAL_TABLET | Freq: Every day | ORAL | Status: DC
  Administered 2019-11-23 – 2019-11-27 (×5): 2.5 mg via ORAL
  Filled 2019-11-21 (×6): qty 1

## 2019-11-21 MED ORDER — FINASTERIDE 5 MG PO TABS
5.0000 mg | ORAL_TABLET | Freq: Every day | ORAL | Status: DC
  Administered 2019-11-23 – 2019-11-27 (×5): 5 mg via ORAL
  Filled 2019-11-21 (×7): qty 1

## 2019-11-21 MED ORDER — CARBIDOPA-LEVODOPA 25-100 MG PO TABS: 2.0000 | ORAL_TABLET | Freq: Three times a day (TID) | ORAL | Status: DC

## 2019-11-21 MED ORDER — VITAMIN B-12 1000 MCG PO TABS
500.0000 ug | ORAL_TABLET | Freq: Every day | ORAL | Status: DC
  Administered 2019-11-23 – 2019-11-27 (×5): 500 ug via ORAL
  Filled 2019-11-21 (×7): qty 1

## 2019-11-21 MED ORDER — ASPIRIN EC 81 MG PO TBEC
81.0000 mg | DELAYED_RELEASE_TABLET | ORAL | Status: DC
  Administered 2019-11-24 – 2019-11-26 (×2): 81 mg via ORAL
  Filled 2019-11-21 (×5): qty 1

## 2019-11-21 NOTE — Telephone Encounter (Signed)
Noted. Agree with ER eval by EMS as she is physically unable to bring him to ER or to office.

## 2019-11-21 NOTE — ED Notes (Signed)
Pt is laying in bed, eyes closed resting. Pt has IVF infusing. Please see MAR.  No signs of distress noted.
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
## 2019-11-21 NOTE — ED Notes (Signed)
Pt wife notified that pt is going to room 158.

## 2019-11-21 NOTE — ED Notes (Signed)
Pt brought in via ems from home.  Hx parkinsons, dementia.  Wife reports not eating since yesterday, feeling weak, not out of bed.  Pt unable to walk.  Iv in place on arrival to er.  2nd iv stared.  Labs sent.  nsr on monitor at 84.  Pt with eyes closed, follows some verbal commands.

## 2019-11-21 NOTE — Telephone Encounter (Signed)
Pt's wife, [name], calling stating pt's behavior is "off".  Says she has not walked since Thurs [11/15/19], his speech is off and he is not eating/drinking like normal.  Also, states he is not urinating as much.  Advised her pt needs emergent care.  Says she has a "nurse friend" who said the same thing but she wanted to check with Dr. Darnell Level first.   [name] says she will call EMS to transport pt to [facility] ED.  FYI to Dr. Darnell Level.
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
## 2019-11-21 NOTE — ED Triage Notes (Signed)
Pr arrives via ems from home. Ems reports possible sepsis. Pt baseline of dementia and parkinson's. Wife reported to ems that pt has not been able to stand or pivot since last Thursday, and has not eat/drink x 2 days. Pt normally verbal at times, and makes eye contacts at time but has not done so over last week. Pt non verbal on arrival. MD at bedside to assess

## 2019-11-22 ENCOUNTER — Other Ambulatory Visit: Payer: Self-pay

## 2019-11-22 DIAGNOSIS — I10 Essential (primary) hypertension: Secondary | ICD-10-CM

## 2019-11-22 DIAGNOSIS — A4151 Sepsis due to Escherichia coli [E. coli]: Principal | ICD-10-CM

## 2019-11-22 DIAGNOSIS — N170 Acute kidney failure with tubular necrosis: Secondary | ICD-10-CM

## 2019-11-22 DIAGNOSIS — F0281 Dementia in other diseases classified elsewhere with behavioral disturbance: Secondary | ICD-10-CM

## 2019-11-22 DIAGNOSIS — G20 Parkinson's disease: Secondary | ICD-10-CM

## 2019-11-22 DIAGNOSIS — R6520 Severe sepsis without septic shock: Secondary | ICD-10-CM

## 2019-11-22 DIAGNOSIS — D649 Anemia, unspecified: Secondary | ICD-10-CM

## 2019-11-22 LAB — BLOOD CULTURE ID PANEL (REFLEXED)
Acinetobacter baumannii: NOT DETECTED
Acinetobacter baumannii: NOT DETECTED
Candida albicans: NOT DETECTED
Candida albicans: NOT DETECTED
Candida glabrata: NOT DETECTED
Candida glabrata: NOT DETECTED
Candida krusei: NOT DETECTED
Candida krusei: NOT DETECTED
Candida parapsilosis: NOT DETECTED
Candida parapsilosis: NOT DETECTED
Candida tropicalis: NOT DETECTED
Candida tropicalis: NOT DETECTED
Carbapenem resistance: NOT DETECTED
Enterobacter cloacae complex: NOT DETECTED
Enterobacter cloacae complex: NOT DETECTED
Enterobacteriaceae species: DETECTED — AB
Enterobacteriaceae species: NOT DETECTED
Enterococcus species: NOT DETECTED
Enterococcus species: NOT DETECTED
Escherichia coli: DETECTED — AB
Escherichia coli: NOT DETECTED
Haemophilus influenzae: NOT DETECTED
Haemophilus influenzae: NOT DETECTED
Klebsiella oxytoca: NOT DETECTED
Klebsiella oxytoca: NOT DETECTED
Klebsiella pneumoniae: NOT DETECTED
Klebsiella pneumoniae: NOT DETECTED
Listeria monocytogenes: NOT DETECTED
Listeria monocytogenes: NOT DETECTED
Methicillin resistance: DETECTED — AB
Neisseria meningitidis: NOT DETECTED
Neisseria meningitidis: NOT DETECTED
Proteus species: NOT DETECTED
Proteus species: NOT DETECTED
Pseudomonas aeruginosa: NOT DETECTED
Pseudomonas aeruginosa: NOT DETECTED
Serratia marcescens: NOT DETECTED
Serratia marcescens: NOT DETECTED
Staphylococcus aureus (BCID): NOT DETECTED
Staphylococcus aureus (BCID): NOT DETECTED
Staphylococcus species: NOT DETECTED
Staphylococcus species: DETECTED — AB
Streptococcus agalactiae: NOT DETECTED
Streptococcus agalactiae: NOT DETECTED
Streptococcus pneumoniae: NOT DETECTED
Streptococcus pneumoniae: NOT DETECTED
Streptococcus pyogenes: NOT DETECTED
Streptococcus pyogenes: NOT DETECTED
Streptococcus species: NOT DETECTED
Streptococcus species: NOT DETECTED

## 2019-11-22 LAB — BASIC METABOLIC PANEL
Anion gap: 6 (ref 5–15)
BUN: 51 mg/dL — ABNORMAL HIGH (ref 8–23)
CO2: 24 mmol/L (ref 22–32)
Calcium: 8.4 mg/dL — ABNORMAL LOW (ref 8.9–10.3)
Chloride: 112 mmol/L — ABNORMAL HIGH (ref 98–111)
Creatinine, Ser: 2.15 mg/dL — ABNORMAL HIGH (ref 0.61–1.24)
GFR calc Af Amer: 34 mL/min — ABNORMAL LOW (ref >60)
GFR calc non Af Amer: 29 mL/min — ABNORMAL LOW (ref >60)
Glucose, Bld: 140 mg/dL — ABNORMAL HIGH (ref 70–99)
Potassium: 3.3 mmol/L — ABNORMAL LOW (ref 3.5–5.1)
Sodium: 142 mmol/L (ref 135–145)

## 2019-11-22 LAB — CBC
HCT: 34.8 % — ABNORMAL LOW (ref 39.0–52.0)
Hemoglobin: 11.4 g/dL — ABNORMAL LOW (ref 13.0–17.0)
MCH: 26.6 pg (ref 26.0–34.0)
MCHC: 32.8 g/dL (ref 30.0–36.0)
MCV: 81.1 fL (ref 80.0–100.0)
Platelets: 183 10*3/uL (ref 150–400)
RBC: 4.29 MIL/uL (ref 4.22–5.81)
RDW: 16 % — ABNORMAL HIGH (ref 11.5–15.5)
WBC: 15.4 10*3/uL — ABNORMAL HIGH (ref 4.0–10.5)
nRBC: 0 % (ref 0.0–0.2)

## 2019-11-22 MED ORDER — ACETAMINOPHEN 325 MG PO TABS
325.0000 mg | ORAL_TABLET | Freq: Four times a day (QID) | ORAL | Status: DC | PRN
  Administered 2019-11-22 – 2019-11-27 (×4): 650 mg via ORAL
  Filled 2019-11-22 (×4): qty 2

## 2019-11-22 MED ORDER — SODIUM CHLORIDE 0.9 % IV SOLN
1.0000 g | Freq: Two times a day (BID) | INTRAVENOUS | Status: DC
  Administered 2019-11-22 – 2019-11-23 (×4): 1 g via INTRAVENOUS
  Filled 2019-11-22 (×6): qty 1

## 2019-11-22 MED ORDER — ACETAMINOPHEN 160 MG/5ML PO SOLN
160.0000 mg | Freq: Four times a day (QID) | ORAL | Status: DC | PRN
  Filled 2019-11-22: qty 20.3

## 2019-11-22 MED ORDER — POTASSIUM CHLORIDE IN NACL 40-0.9 MEQ/L-% IV SOLN
INTRAVENOUS | Status: DC
  Administered 2019-11-22 – 2019-11-23 (×2): 75 mL/h via INTRAVENOUS
  Filled 2019-11-22 (×3): qty 1000
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
## 2019-11-22 NOTE — Progress Notes (Signed)
OT Cancellation Note  Patient Details Name: Brandon Manning MRN: [id number] DOB: 01-04-1946   Cancelled Treatment:    Reason Eval/Treat Not Completed: Patient declined, no reason specified. Consult received, chart reviewed. Pt intermittently alert, requests therapist "come back tomorrow." No family members present. RN notes that wife should be here soon. Will re-attempt at later time when family is present to support participation.  Jeni Salles, MPH, MS, OTR/L ascom [phone] 11/22/19, 11:24 AM

## 2019-11-22 NOTE — Progress Notes (Signed)
[facility] [place] NAME: Brandon Manning    MR#:  [id number]  DATE OF BIRTH:  [birth year]  SUBJECTIVE:    REVIEW OF SYSTEMS:   ROS Tolerating Diet: Tolerating PT:   DRUG ALLERGIES:   Allergies  Allergen Reactions  . Penicillins Rash    Has patient had a PCN reaction causing immediate rash, facial/tongue/throat swelling, SOB or lightheadedness with hypotension: No Has patient had a PCN reaction causing severe rash involving mucus membranes or skin necrosis: No Has patient had a PCN reaction that required hospitalization No Has patient had a PCN reaction occurring within the last 10 years: No If all of the above answers are "NO", then may proceed with Cephalosporin use.    VITALS:  Blood pressure 135/80, pulse 91, temperature 97.7 F (36.5 C), temperature source Oral, resp. rate 16, height 5\' 8"  (1.727 m), weight 79 kg, SpO2 100 %.  PHYSICAL EXAMINATION:   Physical Exam  GENERAL:  74 y.o.-year-old patient lying in the bed with no acute distress.  EYES: Pupils equal, round, reactive to light and accommodation. No scleral icterus.   HEENT: Head atraumatic, normocephalic. Oropharynx and nasopharynx clear.  NECK:  Supple, no jugular venous distention. No thyroid enlargement, no tenderness.  LUNGS: Normal breath sounds bilaterally, no wheezing, rales, rhonchi. No use of accessory muscles of respiration.  CARDIOVASCULAR: S1, S2 normal. No murmurs, rubs, or gallops.  ABDOMEN: Soft, nontender, nondistended. Bowel sounds present. No organomegaly or mass.  EXTREMITIES: No cyanosis, clubbing or edema b/l.    NEUROLOGIC: Cranial nerves II through XII are intact. No focal Motor or sensory deficits b/l.   PSYCHIATRIC:  patient is alert and oriented x 3.  SKIN: No obvious rash, lesion, or ulcer.   LABORATORY PANEL:  CBC Recent Labs  Lab 11/22/19 0507  WBC 15.4*  HGB 11.4*  HCT 34.8*  PLT 183    Chemistries  Recent Labs  Lab  11/21/19 1614 11/21/19 1614 11/22/19 0507  NA 141   < > 142  K 3.2*   < > 3.3*  CL 112*   < > 112*  CO2 22   < > 24  GLUCOSE 161*   < > 140*  BUN 59*   < > 51*  CREATININE 2.53*   < > 2.15*  CALCIUM 8.3*   < > 8.4*  AST 21  --   --   ALT 5  --   --   ALKPHOS 72  --   --   BILITOT 1.2  --   --    < > = values in this interval not displayed.   Cardiac Enzymes No results for input(s): TROPONINI in the last 168 hours. RADIOLOGY:  DG Chest Port 1 View  Result Date: 11/21/2019 CLINICAL DATA:  Fever.  Additional provided: Possible sepsis. EXAM: PORTABLE CHEST 1 VIEW COMPARISON:  Prior chest radiographs 05/29/2019 and earlier. FINDINGS: Heart size within normal limits. Elevation of left hemidiaphragm with associated mild left basilar atelectasis. There are interstitial and ill-defined opacities throughout both lungs. No sizable pleural effusion or evidence of pneumothorax. No acute bony abnormality identified. Thoracic spondylosis. Air distention of the stomach within the partially imaged upper abdomen. IMPRESSION: Interstitial and ill-defined opacities within both lung suspicious for pneumonia, possibly atypical/viral. Edema may also have this imaging appearance. Electronically Signed   By: Kellie Simmering DO   On: 11/21/2019 16:30   ASSESSMENT AND PLAN:   Brandon Manning is a 74 y.o. male  with medical history significant of advanced Parkinson's disease, BPH, HTN, PSVT. His attentive wife and care-giver noted that he was weaker than usual, staying in the bed and had a low grade fever at home. She was concerned for possible infection and brought him to [facility] for evaluation   1.Sepsis with Acute on [place] renal failure due to ATN/dehydration --source urine -patient came in with fever 101, heart rate in the 90s, elevated white count 12.4  and positive U/A. Present on admission  -this is most likely cause of increased weakness and fever.  -BC ID positive for Ecoli and Staph species  (contamination) -IV Meropenem -follow fever curve and white count -IV fluids -chest x-ray ?fluid vs PNA--pt on broad spectrum abxs -w/f aspiration  2. AKI on chronic kidney disease. Creatinine baseline 1.3-1.4  - per wife patient with poor intake over the past several days.  -His rise in creatinine and BUN suggest prerenal azotemia/ATN with sepsis -continue IV hydration-avoid nephrotoxic agents  -monitor creatinine   3. Parkinson's disease - advanced with associated dementia. - He is at high risk for aspiration. -Continue home medications -SLP to evaluate swallow:-- appreciate input.  4. HTN- continue home meds  5. Code status - discussed with wife, HCPOA, and he is DNR prior to admission  6. Disposition - advanced parkinson's. Will have PT/OT assessment re: HH needs vs short-term SNF. TOC consult placed.   DVT prophylaxis: lovenox Code Status: DNR Family Communication: Spoke with Karle Plumber, wife.  Answered all questions  Disposition Plan: TBD  Consults called: none  Admission status: inpatient   Status is: Inpatient  Remains inpatient appropriate because:Inpatient level of care appropriate due to severity of illness sepsis due to UTI, renal fialure   Dispo: The patient is from: Home              Anticipated d/c is to: TBD              Anticipated d/c date is: > 3 days              Patient currently is not medically stable to d/c.         TOTAL TIME TAKING CARE OF THIS PATIENT: *40* minutes.  >50% time spent on counselling and coordination of care  Note: This dictation was prepared with Dragon dictation along with smaller phrase technology. Any transcriptional errors that result from this process are unintentional.  Fritzi Mandes M.D    [facility]   CC: Primary care physician; [name], MDPatient ID: Brandon Manning, male   DOB: [birth year], 74 y.o.   MRN: [id number]

## 2019-11-22 NOTE — Progress Notes (Addendum)
   11/22/19 1746  Assess: MEWS Score  Temp (!) 100.5 F (38.1 C)  BP 122/70  Pulse Rate 98  Resp (!) 32  Level of Consciousness Alert  SpO2 98 %  O2 Device Room Air  Assess: MEWS Score  MEWS Temp 1  MEWS Systolic 0  MEWS Pulse 0  MEWS RR 2  MEWS LOC 0  MEWS Score 3  MEWS Score Color Yellow  Assess: if the MEWS score is Yellow or Red  Were vital signs taken at a resting state? Yes  Focused Assessment Documented focused assessment  MEWS guidelines implemented *See Row Information* Yes  Treat  MEWS Interventions Administered prn meds/treatments  Take Vital Signs  Increase Vital Sign Frequency  Yellow: Q 2hr X 2 then Q 4hr X 2, if remains yellow, continue Q 4hrs  Notify: Charge Nurse/RN  Name of Charge Nurse/RN Notified Clovis Riley  Date Charge Nurse/RN Notified 11/22/19  Time Charge Nurse/RN Notified 1800  Notify: Provider  Provider Name/Title Fritzi Mandes  Date Provider Notified 11/22/19  Time Provider Notified 1750  Notification Type Page  Notification Reason Change in status  Date of Provider Response 11/22/19  Time of Provider Response 1751  Document  Progress note created (see row info) Yes  Orders given for Tylenol if temp over 100 degrees. Tylenol given.  Dr. Also aware of red sepsis score, which is baseline from admission (currently treating with antibiotics).

## 2019-11-22 NOTE — Evaluation (Signed)
Physical Therapy Evaluation Patient Details Name: Brandon Manning MRN: [id number] DOB: December 22, 1945 Today's Date: 11/22/2019   History of Present Illness  Per MD notes: Pt is a 74 y.o. male with medical history significant of advanced Parkinson's disease, BPH, HTN, PSVT.   MD assessment includes: UTI, AKI, and HTN.    Clinical Impression  Pt lethargic but able to respond to yes/no questions occasionally and participated with below therex, although with minimal effort, with cues and assistance.  Pt required near constant min-mod A to prevent L lateral LOB with only min righting reactions noted.  Transfer assessment/training deferred for pt safety this session.  No family available to assist with history and PLOF but during prior admission in November of 2020 pt was a household ambulator with a RW with spouse assisting with ADLs. Pt presents with significant deficits in functional mobility compared to that recent baseline and would not be safe to return to his prior living situation at this time.  Pt will benefit from PT services in a SNF setting upon discharge to safely address deficits listed in patient problem list for decreased caregiver assistance and eventual return to PLOF.     Follow Up Recommendations SNF    Equipment Recommendations  Other (comment)(TBD at next venue of care)    Recommendations for Other Services       Precautions / Restrictions Precautions Precautions: Fall Restrictions Weight Bearing Restrictions: No      Mobility  Bed Mobility Overal bed mobility: Needs Assistance Bed Mobility: Supine to Sit;Sit to Supine     Supine to sit: +2 for physical assistance;Total assist Sit to supine: +2 for physical assistance;Total assist   General bed mobility comments: Minimal effort from the pt during sup to/from sit  Transfers                 General transfer comment: Unable/unsafe to attempt  Ambulation/Gait                Stairs             Wheelchair Mobility    Modified Rankin (Stroke Patients Only)       Balance Overall balance assessment: Needs assistance   Sitting balance-Leahy Scale: Poor Sitting balance - Comments: Min to mod A to prevent L lateral LOB with only min righting reactions noted Postural control: Left lateral lean                                   Pertinent Vitals/Pain Pain Assessment: No/denies pain    Home Living Family/patient expects to be discharged to:: Unsure                 Additional Comments: Pt unable to provide history with no family available to assist; per notes from admission 05/2019: pt lives with spouse in [place] home with lvl entry.    Prior Function           Comments: Per notes from 05/2019: Pt ambulates with a RW in the home with spouse assisting with ADLs     Hand Dominance        Extremity/Trunk Assessment   Upper Extremity Assessment Upper Extremity Assessment: Difficult to assess due to impaired cognition    Lower Extremity Assessment Lower Extremity Assessment: Difficult to assess due to impaired cognition       Communication      Cognition Arousal/Alertness: Lethargic Behavior During Therapy: Flat affect  Overall Cognitive Status: No family/caregiver present to determine baseline cognitive functioning                                        General Comments      Exercises Total Joint Exercises Ankle Circles/Pumps: AAROM;Both;10 reps;15 reps Short Arc Quad: AAROM;Both;15 reps;10 reps Heel Slides: AAROM;Both;5 reps Hip ABduction/ADduction: AAROM;Both;10 reps Straight Leg Raises: AAROM;Both;10 reps Long Arc Quad: AAROM;Both;10 reps Knee Flexion: AAROM;Both;10 reps Other Exercises Other Exercises: Static sitting balance training at the EOB with emphasis on R lateral leaning to address L lateral instability   Assessment/Plan    PT Assessment Patient needs continued PT services  PT Problem List  Decreased strength;Decreased activity tolerance;Decreased balance;Decreased mobility;Decreased knowledge of use of DME;Decreased safety awareness       PT Treatment Interventions DME instruction;Gait training;Functional mobility training;Therapeutic activities;Therapeutic exercise;Balance training;Patient/family education    PT Goals (Current goals can be found in the Care Plan section)  Acute Rehab PT Goals PT Goal Formulation: Patient unable to participate in goal setting Time For Goal Achievement: 12/05/19 Potential to Achieve Goals: Fair    Frequency Min 2X/week   Barriers to discharge Inaccessible home environment;Decreased caregiver support      Co-evaluation               AM-PAC PT "6 Clicks" Mobility  Outcome Measure Help needed turning from your back to your side while in a flat bed without using bedrails?: Total Help needed moving from lying on your back to sitting on the side of a flat bed without using bedrails?: Total Help needed moving to and from a bed to a chair (including a wheelchair)?: Total Help needed standing up from a chair using your arms (e.g., wheelchair or bedside chair)?: Total Help needed to walk in hospital room?: Total Help needed climbing 3-5 steps with a railing? : Total 6 Click Score: 6    End of Session Equipment Utilized During Treatment: Gait belt Activity Tolerance: Patient tolerated treatment well Patient left: in bed;with call bell/phone within reach;with bed alarm set Nurse Communication: Mobility status PT Visit Diagnosis: Muscle weakness (generalized) (M62.81);Difficulty in walking, not elsewhere classified (R26.2)    Time: [id number] PT Time Calculation (min) (ACUTE ONLY): 28 min   Charges:   PT Evaluation $PT Eval Moderate Complexity: 1 Mod PT Treatments $Therapeutic Exercise: 8-22 mins        D. Royetta Asal PT, DPT 11/22/19, 1:37 PM

## 2019-11-22 NOTE — Plan of Care (Signed)
Dr. Ree Kida and aware of red sepsis score. Patinet already receiving antibiotics and still at baseline from admission.  Will assess as needed on rounds.

## 2019-11-22 NOTE — Progress Notes (Signed)
PHARMACY - PHYSICIAN COMMUNICATION CRITICAL VALUE ALERT - BLOOD CULTURE IDENTIFICATION (BCID)  Brandon Manning is an 74 y.o. male who presented to [facility] on 11/21/2019 with weakness and fever  Assessment:  1 bottle (set 1) E.coli and 1 bottle (set 2) staph species, mecA+  Name of physician (or Provider) Contacted: Dr. Posey Pronto  Current antibiotics: ceftriaxone, azithromycin  Changes to prescribed antibiotics recommended: ceftriaxone to meropenem pending E.coli sensitivities, defer vancomycin as staph species is likely a contaminant  Results for orders placed or performed during the hospital encounter of 11/21/19  Blood Culture ID Panel (Reflexed) (Collected: 11/21/2019  4:08 PM)  Result Value Ref Range   Enterococcus species NOT DETECTED NOT DETECTED   Listeria monocytogenes NOT DETECTED NOT DETECTED   Staphylococcus species DETECTED (A) NOT DETECTED   Staphylococcus aureus (BCID) NOT DETECTED NOT DETECTED   Methicillin resistance DETECTED (A) NOT DETECTED   Streptococcus species NOT DETECTED NOT DETECTED   Streptococcus agalactiae NOT DETECTED NOT DETECTED   Streptococcus pneumoniae NOT DETECTED NOT DETECTED   Streptococcus pyogenes NOT DETECTED NOT DETECTED   Acinetobacter baumannii NOT DETECTED NOT DETECTED   Enterobacteriaceae species NOT DETECTED NOT DETECTED   Enterobacter cloacae complex NOT DETECTED NOT DETECTED   Escherichia coli NOT DETECTED NOT DETECTED   Klebsiella oxytoca NOT DETECTED NOT DETECTED   Klebsiella pneumoniae NOT DETECTED NOT DETECTED   Proteus species NOT DETECTED NOT DETECTED   Serratia marcescens NOT DETECTED NOT DETECTED   Haemophilus influenzae NOT DETECTED NOT DETECTED   Neisseria meningitidis NOT DETECTED NOT DETECTED   Pseudomonas aeruginosa NOT DETECTED NOT DETECTED   Candida albicans NOT DETECTED NOT DETECTED   Candida glabrata NOT DETECTED NOT DETECTED   Candida krusei NOT DETECTED NOT DETECTED   Candida parapsilosis NOT DETECTED NOT DETECTED    Candida tropicalis NOT DETECTED NOT DETECTED    Tawnya Crook, PharmD 11/22/2019  10:18 AM

## 2019-11-23 LAB — BASIC METABOLIC PANEL
Anion gap: 7 (ref 5–15)
BUN: 45 mg/dL — ABNORMAL HIGH (ref 8–23)
CO2: 24 mmol/L (ref 22–32)
Calcium: 8.9 mg/dL (ref 8.9–10.3)
Chloride: 122 mmol/L — ABNORMAL HIGH (ref 98–111)
Creatinine, Ser: 2.14 mg/dL — ABNORMAL HIGH (ref 0.61–1.24)
GFR calc Af Amer: 34 mL/min — ABNORMAL LOW (ref >60)
GFR calc non Af Amer: 29 mL/min — ABNORMAL LOW (ref >60)
Glucose, Bld: 144 mg/dL — ABNORMAL HIGH (ref 70–99)
Potassium: 4.3 mmol/L (ref 3.5–5.1)
Sodium: 153 mmol/L — ABNORMAL HIGH (ref 135–145)

## 2019-11-23 MED ORDER — DEXTROSE-NACL 5-0.45 % IV SOLN: INTRAVENOUS | Status: AC

## 2019-11-23 NOTE — Progress Notes (Signed)
D: Pt alert and oriented x 1 (self). Pt does not appear to be in pain, however while more alert today is still drowsy. Pt will rouse to name and attempt to have conversation/answer questions although not always correct.  A: Scheduled medications administered to pt, per MD orders. Support and encouragement provided. Frequent verbal contact made.    R: No adverse drug reactions noted. Pt complaint with medications and treatment plan. Pt interacts well with staff on the unit. Pt is stable at this time, Will continue to monitor and provide care for as ordered.

## 2019-11-23 NOTE — TOC Progression Note (Signed)
Transition of Care [facility]) - Progression Note    Patient Details  Name: Brandon Manning MRN: [id number] Date of Birth: 06/05/1946  Transition of Care [facility]) CM/SW Contact  Su Hilt, RN Phone Number: 11/23/2019, 12:35 PM  Clinical Narrative:    Spoke with [name], she said that he did well at [facility] in the past and she would like for him to go there again, if they have a bed , I explained that we would do a bed search in case [facility] does not have a bed offer. She agreed,         Expected Discharge Plan and Services                                                 Social Determinants of Health (SDOH) Interventions    Readmission Risk Interventions No flowsheet data found.

## 2019-11-23 NOTE — Care Management Important Message (Signed)
Important Message  Patient Details  Name: Brandon Manning MRN: [id number] Date of Birth: Oct 13, 1945   Medicare Important Message Given:  N/A - LOS <3 / Initial given by admissions     [name] 11/23/2019, 10:11 AM

## 2019-11-23 NOTE — Progress Notes (Signed)
  Speech Language Pathology Treatment: Dysphagia  Patient Details Name: Brandon Manning MRN: [id number] DOB: [birth year] Today's Date: 11/23/2019 Time: 1435-1500 SLP Time Calculation (min) (ACUTE ONLY): 25 min  Assessment / Plan / Recommendation Clinical Impression  Pt seen for ongoing dysphagia therapy. Pt continues to open eyes transiently even with Maximal stimulation. He was able to consume 2 boluses of nectar thick liquids during peak small period of alertness. This Probation officer spoke with pt's CSW and pt's nurse. Pt is safest to remain on nectar thick liquids and supect that nectar thick liquids will be the safest at discharge d/t deficits in mentation. Current plan would be for pt to discharge to SNF on nectar thick liquids with ST services at that venue of care. ST will continue following pt for any improvements prior to discharge.     HPI HPI: Pt is a 74 y.o. male with medical history significant of advanced Parkinson's disease, BPH, HTN, PSVT. His attentive wife and care-giver noted that he was weaker than usual, staying in the bed and had a low grade fever at home. She was concerned for possible infection and brought him to [facility] for evaluation.  CXR: "CXR with ill-defined opacities bilaterally suggestive of PNA vs pulmonary edema".  Pt admitted for UTI, dehydration.        SLP Plan  Continue with current plan of care       Recommendations  Diet recommendations: Dysphagia 2 (fine chop);Nectar-thick liquid Liquids provided via: Cup Medication Administration: Crushed with puree Supervision: Staff to assist with self feeding;Full supervision/cueing for compensatory strategies Compensations: Minimize environmental distractions;Slow rate;Small sips/bites Postural Changes and/or Swallow Maneuvers: Seated upright 90 degrees                Oral Care Recommendations: Oral care BID;Oral care before and after PO;Staff/trained caregiver to provide oral care Follow up Recommendations:  Skilled Nursing facility SLP Visit Diagnosis: Dysphagia, oropharyngeal phase (R13.12)(d/t cognitive decline) Plan: Continue with current plan of care       GO             Brandon Manning B. Brandon Manning M.S., CCC-SLP, [place] Office [phone]    Brandon Manning 11/23/2019, 3:14 PM

## 2019-11-23 NOTE — Evaluation (Signed)
Occupational Therapy Evaluation Patient Details Name: Brandon Manning MRN: [id number] DOB: 09-29-1945 Today's Date: 11/23/2019    History of Present Illness Per MD notes: Pt is a 74 y.o. male with medical history significant of advanced Parkinson's disease, BPH, HTN, PSVT.   MD assessment includes: UTI, AKI, and HTN.   Clinical Impression   Pt was seen for OT evaluation this date. Prior to hospital admission, per chart review pt lives his spouse who is his primary caregiver. Unclear how much assist pt required prior to admission given pt's cognition and no family/caregivers present at time of evaluation. Per previous recent admission in November 2020, pt was ambulatory and required some assist for ADL. Currently pt demonstrates impairments as described below (See OT problem list) which functionally limit his ability to perform ADL/self-care tasks. Pt currently requires Max-Total Assist +1-2 for all ADL tasks and ADL mobiltiy attempts. Pt lethargic during session, requiring moderate to max multimodal cues to remain alert. Pt noted with suboptimal positioning in bed for assisted self feeding of breakfast which had arrived and been set up on tray. Total assist x2 for repositioning both to improve positioning for optimally safe self feeding but also for skin integrity/joint protection. Pt declined assist for self feeding at that time, requesting to sleep. RN notified of repositioning and need for assist for self feeding when pt is more alert. Pt would benefit from skilled OT to address noted impairments and functional limitations (see below for any additional details) in order to maximize safety and independence while minimizing falls risk and caregiver burden. Upon hospital discharge, recommend STR to maximize pt safety and return to PLOF.     Follow Up Recommendations  SNF    Equipment Recommendations  3 in 1 bedside commode    Recommendations for Other Services       Precautions / Restrictions  Precautions Precautions: Fall Restrictions Weight Bearing Restrictions: No      Mobility Bed Mobility Overal bed mobility: Needs Assistance             General bed mobility comments: Total x2 assist for scooting up in bed and repositioning to improve safety and skin integrity  Transfers                 General transfer comment: Unable/unsafe to attempt    Balance                                           ADL either performed or assessed with clinical judgement   ADL Overall ADL's : Needs assistance/impaired                                       General ADL Comments: Anticipate pt requires significant assist (at least Max x1-2) for all ADL tasks at this time given impairments     Vision         Perception     Praxis      Pertinent Vitals/Pain Pain Assessment: Faces Faces Pain Scale: No hurt     Hand Dominance Left   Extremity/Trunk Assessment Upper Extremity Assessment Upper Extremity Assessment: Difficult to assess due to impaired cognition   Lower Extremity Assessment Lower Extremity Assessment: Difficult to assess due to impaired cognition       Communication Communication Communication: [facility]  Cognition Arousal/Alertness: Lethargic Behavior During Therapy: Flat affect Overall Cognitive Status: No family/caregiver present to determine baseline cognitive functioning                                 General Comments: pt initially greets therapists but requires multimodal cues to remain alert and ultimately unable to maintain significant alertness for optimal participation   General Comments       Exercises Other Exercises Other Exercises: bed mobility requiring total assist x2 for repositioning in preparation for assisted self feeding   Shoulder Instructions      Home Living Family/patient expects to be discharged to:: Unsure                                 Additional  Comments: Pt unable to provide history with no family available to assist; per notes from admission 05/2019: pt lives with spouse in [place] home with lvl entry.      Prior Functioning/Environment          Comments: Per notes from 05/2019: Pt ambulates with a RW in the home with spouse assisting with ADLs        OT Problem List: Decreased strength;Decreased coordination;Decreased cognition;Decreased activity tolerance;Decreased knowledge of use of DME or AE;Impaired balance (sitting and/or standing);Impaired UE functional use      OT Treatment/Interventions: Self-care/ADL training;Therapeutic exercise;Therapeutic activities;Neuromuscular education;Cognitive remediation/compensation;DME and/or AE instruction;Patient/family education;Balance training    OT Goals(Current goals can be found in the care plan section) Acute Rehab OT Goals OT Goal Formulation: Patient unable to participate in goal setting Time For Goal Achievement: 12/07/19 Potential to Achieve Goals: Good  OT Frequency: Min 1X/week   Barriers to D/C:            Co-evaluation              AM-PAC OT "6 Clicks" Daily Activity     Outcome Measure Help from another person eating meals?: A Lot Help from another person taking care of personal grooming?: A Lot Help from another person toileting, which includes using toliet, bedpan, or urinal?: Total Help from another person bathing (including washing, rinsing, drying)?: Total Help from another person to put on and taking off regular upper body clothing?: A Lot Help from another person to put on and taking off regular lower body clothing?: Total 6 Click Score: 9   End of Session    Activity Tolerance: Patient limited by fatigue;Patient limited by lethargy Patient left: in bed;with call bell/phone within reach;with bed alarm set  OT Visit Diagnosis: Other abnormalities of gait and mobility (R26.89);Muscle weakness (generalized) (M62.81);Other symptoms and signs  involving cognitive function                Time: 0920-0928 OT Time Calculation (min): 8 min Charges:  OT General Charges $OT Visit: 1 Visit OT Evaluation $OT Eval Moderate Complexity: 1 Mod  Jeni Salles, MPH, MS, OTR/L ascom [phone] 11/23/19, 2:02 PM

## 2019-11-23 NOTE — TOC Progression Note (Signed)
Transition of Care [facility]) - Progression Note    Patient Details  Name: Brandon Manning MRN: [id number] Date of Birth: Jun 06, 1946  Transition of Care [facility]) CM/SW Contact  Sritha Chauncey, Gardiner Rhyme, LCSW Phone Number: 11/23/2019, 1:08 PM  Clinical Narrative:  FL2 completed bed search started. Wife prefers [facility]. Await bed offer and medical stability for transfer.          Expected Discharge Plan and Services                                                 Social Determinants of Health (SDOH) Interventions    Readmission Risk Interventions No flowsheet data found.

## 2019-11-23 NOTE — Progress Notes (Signed)
[place] [facility] [place] NAME: Brandon Manning    MR#:  [id number]  DATE OF BIRTH:  08/16/1945  SUBJECTIVE:  patient eating lunch fed by wife in the room. Opens eyes transiently. Does recognize his wife and says a few words to her. No Fever. No acute issues per RN   REVIEW OF SYSTEMS:   Review of Systems  Unable to perform ROS: Dementia   Tolerating Diet:yes Tolerating PT: SNF  DRUG ALLERGIES:   Allergies  Allergen Reactions  . Penicillins Rash    Has patient had a PCN reaction causing immediate rash, facial/tongue/throat swelling, SOB or lightheadedness with hypotension: No Has patient had a PCN reaction causing severe rash involving mucus membranes or skin necrosis: No Has patient had a PCN reaction that required hospitalization No Has patient had a PCN reaction occurring within the last 10 years: No If all of the above answers are "NO", then may proceed with Cephalosporin use.    VITALS:  Blood pressure (!) 176/90, pulse 91, temperature 98.4 F (36.9 C), temperature source Oral, resp. rate 18, height 5\' 8"  (1.727 m), weight 79 kg, SpO2 90 %.  PHYSICAL EXAMINATION:   Physical Exam Limited exam GENERAL:  74 y.o.-year-old patient lying in the bed with no acute distress.   LUNGS: Normal breath sounds bilaterally, no wheezing, rales, rhonchi. No use of accessory muscles of respiration.  CARDIOVASCULAR: S1, S2 normal. No murmurs, rubs, or gallops.  ABDOMEN: Soft, nontender, nondistended. Bowel sounds present. No organomegaly or mass.  EXTREMITIES: No cyanosis, clubbing or edema b/l.    NEUROLOGIC: moves all extremities well, Parkinson's facies+ PSYCHIATRIC:  patient is alert but not conversive--says few words SKIN: No obvious rash, lesion, or ulcer.   LABORATORY PANEL:  CBC Recent Labs  Lab 11/22/19 0507  WBC 15.4*  HGB 11.4*  HCT 34.8*  PLT 183    Chemistries  Recent Labs  Lab 11/21/19 1614 11/22/19 0507 11/23/19 0542   NA 141   < > 153*  K 3.2*   < > 4.3  CL 112*   < > 122*  CO2 22   < > 24  GLUCOSE 161*   < > 144*  BUN 59*   < > 45*  CREATININE 2.53*   < > 2.14*  CALCIUM 8.3*   < > 8.9  AST 21  --   --   ALT 5  --   --   ALKPHOS 72  --   --   BILITOT 1.2  --   --    < > = values in this interval not displayed.   Cardiac Enzymes No results for input(s): TROPONINI in the last 168 hours. RADIOLOGY:  DG Chest Port 1 View  Result Date: 11/21/2019 CLINICAL DATA:  Fever.  Additional provided: Possible sepsis. EXAM: PORTABLE CHEST 1 VIEW COMPARISON:  Prior chest radiographs 05/29/2019 and earlier. FINDINGS: Heart size within normal limits. Elevation of left hemidiaphragm with associated mild left basilar atelectasis. There are interstitial and ill-defined opacities throughout both lungs. No sizable pleural effusion or evidence of pneumothorax. No acute bony abnormality identified. Thoracic spondylosis. Air distention of the stomach within the partially imaged upper abdomen. IMPRESSION: Interstitial and ill-defined opacities within both lung suspicious for pneumonia, possibly atypical/viral. Edema may also have this imaging appearance. Electronically Signed   By: Kellie Simmering DO   On: 11/21/2019 16:30   ASSESSMENT AND PLAN:   Brandon Manning is a 74 y.o. male with medical history  significant of advanced Parkinson's disease, BPH, HTN, PSVT. His attentive wife and care-giver noted that he was weaker than usual, staying in the bed and had a low grade fever at home. She was concerned for possible infection and brought him to [facility] for evaluation   1.Ecoli Sepsis with Acute on Chornic renal failure due to ATN/dehydration --source urine -patient came in with fever 101, heart rate in the 90s, elevated white count 12.4  and positive U/A. Present on admission  -this is most likely cause of increased weakness and fever.  -BC ID positive for Ecoli and Staph species (contamination) -IV Meropenem -follow fever curve  and white count -cont IV fluids -chest x-ray ?fluid vs PNA--pt on broad spectrum abxs -w/f aspiration  2. AKI on chronic kidney disease with Hypernatremia - Creatinine baseline 1.3-1.4  - per wife patient with poor intake over the past several days.  -His rise in creatinine and BUN suggest prerenal azotemia/ATN with sepsis -continue IV hydration-avoid nephrotoxic agents  -monitor creatinine  -creat 2.53--2.1 -change IVF to d5 1/2 NS due to rising sodium  3. Parkinson's disease - advanced with associated dementia. - He is at high risk for aspiration. -Continue home medications Sinemet -SLP to evaluate swallow:-- appreciate input.  4. HTN- continue home meds  5. Code status - discussed with wife, HCPOA, and he is DNR prior to admission    DVT prophylaxis: lovenox Code Status: DNR Family Communication: Spoke with Karle Plumber, wife.  Answered all questions she is requesting pt go to rehab. Has done well per her in the past TOC aware Disposition Plan: to rehab--prefers [facility] called: none  Admission status: inpatient   Status is: Inpatient  Remains inpatient appropriate because:Inpatient level of care appropriate due to severity of illness sepsis due to UTI, renal failure, hypernatremia   Dispo: The patient is from: Home              Anticipated d/c is to: TBD              Anticipated d/c date is: > 3 days              Patient currently is not medically stable to d/c.   TOTAL TIME TAKING CARE OF THIS PATIENT: *35* minutes.  >50% time spent on counselling and coordination of care  Note: This dictation was prepared with Dragon dictation along with smaller phrase technology. Any transcriptional errors that result from this process are unintentional.  Fritzi Mandes M.D    [facility]   CC: Primary care physician; [name], MDPatient ID: Brandon Manning, male   DOB: 07-Jul-1946, 74 y.o.   MRN: [id number]

## 2019-11-23 NOTE — Progress Notes (Signed)
Physical Therapy Treatment Patient Details Name: Brandon Manning MRN: [id number] DOB: 09-24-1945 Today's Date: 11/23/2019    History of Present Illness Per MD notes: Pt is a 74 y.o. male with medical history significant of advanced Parkinson's disease, BPH, HTN, PSVT.   MD assessment includes: UTI, AKI, and HTN.    PT Comments    Pt much more alert this session with improved ability to follow commands and participate with increased effort.  Pt continued to require heavy +2 assist with functional mobility tasks but was able to stand this session. Pt was unable to achieve full upright standing remaining with trunk and B knees in a flexed position but was able to clear the surface of the bed for 15-20 sec during each transfer attempt.  Pt will benefit from PT services in a SNF setting upon discharge to safely address deficits listed in patient problem list for decreased caregiver assistance and eventual return to PLOF.    Follow Up Recommendations  SNF     Equipment Recommendations  Other (comment)(TBD at next venue of care)    Recommendations for Other Services       Precautions / Restrictions Precautions Precautions: Fall Restrictions Weight Bearing Restrictions: No    Mobility  Bed Mobility Overal bed mobility: Needs Assistance Bed Mobility: Rolling;Supine to Sit;Sit to Supine Rolling: Mod assist   Supine to sit: +2 for physical assistance;Max assist Sit to supine: +2 for physical assistance;Max assist   General bed mobility comments: Increased effort from patient this session  Transfers Overall transfer level: Needs assistance Equipment used: Rolling walker (2 wheeled) Transfers: Sit to/from Stand Sit to Stand: +2 physical assistance;Mod assist         General transfer comment: Pt required physical assistance to position feet and for increased trunk flex with pt able to come to partial stand but unable to fully extend knees  Ambulation/Gait              General Gait Details: Unable   Stairs             Wheelchair Mobility    Modified Rankin (Stroke Patients Only)       Balance Overall balance assessment: Needs assistance   Sitting balance-Leahy Scale: Fair Sitting balance - Comments: Pt able to maintain static sitting balance without physical assist this session     Standing balance-Leahy Scale: Poor Standing balance comment: Constant physical assist required to maintain static standing balance                            Cognition Arousal/Alertness: Awake/alert Behavior During Therapy: Flat affect Overall Cognitive Status: No family/caregiver present to determine baseline cognitive functioning                                 General Comments: pt initially greets therapists but requires multimodal cues to remain alert and ultimately unable to maintain significant alertness for optimal participation      Exercises Total Joint Exercises Ankle Circles/Pumps: AAROM;Both;10 reps;15 reps Heel Slides: AAROM;Both;10 reps;15 reps Hip ABduction/ADduction: AAROM;Both;10 reps;15 reps Straight Leg Raises: AAROM;Both;10 reps;15 reps Other Exercises Other Exercises: Sit to/from stand transfers x 4 from elevated EOB Other Exercises: Rolling left/right    General Comments        Pertinent Vitals/Pain Pain Assessment: No/denies pain Faces Pain Scale: No hurt    Home Living Family/patient expects to be discharged  to:: Unsure               Additional Comments: Pt unable to provide history with no family available to assist; per notes from admission 05/2019: pt lives with spouse in [place] home with lvl entry.    Prior Function        Comments: Per notes from 05/2019: Pt ambulates with a RW in the home with spouse assisting with ADLs   PT Goals (current goals can now be found in the care plan section) Progress towards PT goals: Progressing toward goals    Frequency    Min  2X/week      PT Plan Current plan remains appropriate    Co-evaluation              AM-PAC PT "6 Clicks" Mobility   Outcome Measure  Help needed turning from your back to your side while in a flat bed without using bedrails?: A Lot Help needed moving from lying on your back to sitting on the side of a flat bed without using bedrails?: A Lot Help needed moving to and from a bed to a chair (including a wheelchair)?: Total Help needed standing up from a chair using your arms (e.g., wheelchair or bedside chair)?: A Lot Help needed to walk in hospital room?: Total Help needed climbing 3-5 steps with a railing? : Total 6 Click Score: 9    End of Session Equipment Utilized During Treatment: Gait belt Activity Tolerance: Patient tolerated treatment well Patient left: in bed;with call bell/phone within reach;with bed alarm set;with nursing/sitter in room Nurse Communication: Mobility status PT Visit Diagnosis: Muscle weakness (generalized) (M62.81);Difficulty in walking, not elsewhere classified (R26.2)     Time: [id number] PT Time Calculation (min) (ACUTE ONLY): 25 min  Charges:  $Therapeutic Exercise: 8-22 mins $Therapeutic Activity: 8-22 mins                     D. Scott Walsie Smeltz PT, DPT 11/23/19, 5:13 PM

## 2019-11-23 NOTE — NC FL2 (Signed)
[place] LEVEL OF CARE SCREENING TOOL     IDENTIFICATION  Patient Name: Brandon Manning Birthdate: 11/15/1945 Sex: male Admission Date (Current Location): 11/21/2019  [place] and [place] Number:  Engineering geologist and Address:  [facility], [address] [place], [place] [address]      Provider Number: [id number]  Attending Physician Name and Address:  Fritzi Mandes, MD  Relative Name and Phone Number:  Amr Longs  [phone]    Current Level of Care: Hospital Recommended Level of Care: [place] Prior Approval Number:    Date Approved/Denied:   PASRR Number: [id number]  Discharge Plan: SNF    Current Diagnoses: Patient Active Problem List   Diagnosis Date Noted  . Sepsis due to Escherichia coli with acute renal failure and tubular necrosis without septic shock ([place])   . PNA (pneumonia) 11/21/2019  . Acute UTI 11/21/2019  . Anemia 10/31/2019  . PSVT (paroxysmal supraventricular tachycardia) ([place]) 09/13/2019  . Altered mental status 09/03/2019  . Urinary tract infection in elderly patient   . AKI (acute kidney injury) ([place]) 05/29/2019  . Autonomic dysfunction 05/17/2019  . Syncope 05/13/2019  . Pedal edema 04/16/2019  . Vitamin D deficiency 02/02/2019  . Vitamin B12 deficiency 05/10/2018  . Urge incontinence 03/16/2018  . Hematuria 03/16/2018  . Orthostatic hypotension 01/26/2017  . Orthostasis 01/25/2017  . Encounter for chronic pain management 01/07/2017  . Dementia due to Parkinson's disease with behavioral disturbance ([place]) 07/08/2016  . Chronic constipation 12/19/2015  . Medicare annual wellness visit, initial 06/03/2015  . Advanced care planning/counseling discussion 06/03/2015  . Bilateral low back pain without sciatica 08/01/2014  . DOE (dyspnea on exertion) 02/01/2014  . Parkinson disease ([place]) 02/01/2014  . 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 BPH (benign prostatic hyperplasia)  11/03/2012  . HTN (hypertension) 09/09/2011  . Health maintenance examination 06/18/2011  . ED (erectile dysfunction) 06/18/2011    Orientation RESPIRATION BLADDER Height & Weight     Self, Place, Situation  Normal Incontinent Weight: 174 lb 2.6 oz (79 kg) Height:  5\' 8"  (172.7 cm)  BEHAVIORAL SYMPTOMS/MOOD NEUROLOGICAL BOWEL NUTRITION STATUS      Continent Diet(Dys 2 nectar thick liquids)  AMBULATORY STATUS COMMUNICATION OF NEEDS Skin   Extensive Assist Verbally Normal                       Personal Care Assistance Level of Assistance  Bathing, Dressing Bathing Assistance: Limited assistance   Dressing Assistance: Limited assistance     Functional Limitations Info  Speech     Speech Info: Impaired    SPECIAL CARE FACTORS FREQUENCY  PT (By licensed PT), OT (By licensed OT)     PT Frequency: 5x week OT Frequency: 5x week            Contractures Contractures Info: Not present    Additional Factors Info  Code Status, Allergies Code Status Info: DNR Allergies Info: pencillins           Current Medications (11/23/2019):  This is the current hospital active medication list Current Facility-Administered Medications  Medication Dose Route Frequency Provider Last Rate Last Admin  . acetaminophen (TYLENOL) tablet 325-650 mg  325-650 mg Oral Q6H PRN Fritzi Mandes, MD   650 mg at 11/22/19 1828  . amLODipine (NORVASC) tablet 2.5 mg  2.5 mg Oral Daily Norins, Heinz Knuckles, MD   2.5 mg at 11/23/19 1033  . aspirin EC tablet 81 mg  81 mg Oral QODAY Norins, Heinz Knuckles, MD      . carbidopa-levodopa (SINEMET IR) 25-100 MG per tablet immediate release 2 tablet  2 tablet Oral BID Norins, Heinz Knuckles, MD   2 tablet at 11/23/19 1225   And  . carbidopa-levodopa (SINEMET IR) 25-100 MG per tablet immediate release 1 tablet  1 tablet Oral Daily Norins, Heinz Knuckles, MD   1 tablet at 11/22/19 1509  . dextrose 5 %-0.45 % sodium chloride infusion   Intravenous Continuous Fritzi Mandes, MD 75  mL/hr at 11/23/19 1212 Restarted at 11/23/19 1212  . enoxaparin (LOVENOX) injection 30 mg  30 mg Subcutaneous Q24H Norins, Heinz Knuckles, MD   30 mg at 11/22/19 2104  . finasteride (PROSCAR) tablet 5 mg  5 mg Oral Daily Norins, Heinz Knuckles, MD   5 mg at 11/23/19 1033  . meropenem (MERREM) 1 g in sodium chloride 0.9 % 100 mL IVPB  1 g Intravenous Q12H Fritzi Mandes, MD 200 mL/hr at 11/23/19 1112 1 g at 11/23/19 1112  . polyethylene glycol (MIRALAX / GLYCOLAX) packet 17 g  17 g Oral Daily Norins, Heinz Knuckles, MD   17 g at 11/23/19 1033  . sertraline (ZOLOFT) tablet 25 mg  25 mg Oral Daily Norins, Heinz Knuckles, MD   25 mg at 11/23/19 1033  . vitamin B-12 (CYANOCOBALAMIN) tablet 500 mcg  500 mcg Oral Daily Norins, Heinz Knuckles, MD   500 mcg at 11/23/19 1033     Discharge Medications: Please see discharge summary for a list of discharge medications.  Relevant Imaging Results:  Relevant Lab Results:   Additional Information SSN; [id number]  Lainee Lehrman, Gardiner Rhyme, LCSW

## 2019-11-24 DIAGNOSIS — N179 Acute kidney failure, unspecified: Secondary | ICD-10-CM

## 2019-11-24 DIAGNOSIS — N390 Urinary tract infection, site not specified: Secondary | ICD-10-CM

## 2019-11-24 LAB — CULTURE, BLOOD (ROUTINE X 2): Special Requests: ADEQUATE

## 2019-11-24 LAB — BASIC METABOLIC PANEL
Anion gap: 4 — ABNORMAL LOW (ref 5–15)
BUN: 37 mg/dL — ABNORMAL HIGH (ref 8–23)
CO2: 24 mmol/L (ref 22–32)
Calcium: 8.7 mg/dL — ABNORMAL LOW (ref 8.9–10.3)
Chloride: 121 mmol/L — ABNORMAL HIGH (ref 98–111)
Creatinine, Ser: 1.65 mg/dL — ABNORMAL HIGH (ref 0.61–1.24)
GFR calc Af Amer: 46 mL/min — ABNORMAL LOW (ref >60)
GFR calc non Af Amer: 40 mL/min — ABNORMAL LOW (ref >60)
Glucose, Bld: 142 mg/dL — ABNORMAL HIGH (ref 70–99)
Potassium: 3.7 mmol/L (ref 3.5–5.1)
Sodium: 149 mmol/L — ABNORMAL HIGH (ref 135–145)

## 2019-11-24 MED ORDER — DEXTROSE 5 % IV SOLN: INTRAVENOUS | Status: AC

## 2019-11-24 MED ORDER — ENOXAPARIN SODIUM 40 MG/0.4ML ~~LOC~~ SOLN
40.0000 mg | SUBCUTANEOUS | Status: DC
  Administered 2019-11-24 – 2019-11-26 (×3): 40 mg via SUBCUTANEOUS
  Filled 2019-11-24 (×3): qty 0.4

## 2019-11-24 MED ORDER — SODIUM CHLORIDE 0.9 % IV SOLN
2.0000 g | INTRAVENOUS | Status: DC
  Administered 2019-11-24 – 2019-11-26 (×3): 2 g via INTRAVENOUS
  Filled 2019-11-24 (×3): qty 2

## 2019-11-24 NOTE — Progress Notes (Signed)
PHARMACIST - PHYSICIAN COMMUNICATION  CONCERNING:  Enoxaparin (Lovenox) for DVT Prophylaxis    RECOMMENDATION: Patient was prescribed enoxaprin 30mg  q24 hours for VTE prophylaxis.   Filed Weights   11/21/19 1607  Weight: 79 kg (174 lb 2.6 oz)    Body mass index is 26.48 kg/m.  Estimated Creatinine Clearance: 37.4 mL/min (A) (by C-G formula based on SCr of 1.65 mg/dL (H)).   Patient is candidate for enoxaparin 40mg  every 24 hours based on CrCl >52ml/min  DESCRIPTION: Pharmacy has adjusted enoxaparin dose per [facility] policy.  Patient is now receiving enoxaparin 40mg  every 24 hours.  Cheri Guppy, PharmD Clinical Pharmacist  11/24/2019 9:32 AM

## 2019-11-24 NOTE — Progress Notes (Signed)
[place] at [place] NAME: Brandon Manning    MR#:  [id number]  DATE OF BIRTH:  1945-12-11  SUBJECTIVE:  patient awakens on VC. Resting quietly--says a few words to me. Repeats "Good morning" No Fever. No acute issues per RN   REVIEW OF SYSTEMS:   Review of Systems  Unable to perform ROS: Dementia   Tolerating Diet:yes Tolerating PT: SNF  DRUG ALLERGIES:   Allergies  Allergen Reactions  . Penicillins Rash    Has patient had a PCN reaction causing immediate rash, facial/tongue/throat swelling, SOB or lightheadedness with hypotension: No Has patient had a PCN reaction causing severe rash involving mucus membranes or skin necrosis: No Has patient had a PCN reaction that required hospitalization No Has patient had a PCN reaction occurring within the last 10 years: No If all of the above answers are "NO", then may proceed with Cephalosporin use.    VITALS:  Blood pressure 138/77, pulse 89, temperature 98.5 F (36.9 C), temperature source Oral, resp. rate 16, height 5\' 8"  (1.727 m), weight 79 kg, SpO2 100 %.  PHYSICAL EXAMINATION:   Physical Exam Limited exam GENERAL:  74 y.o.-year-old patient lying in the bed with no acute distress.   LUNGS: Normal breath sounds bilaterally, no wheezing, rales, rhonchi. No use of accessory muscles of respiration.  CARDIOVASCULAR: S1, S2 normal. No murmurs, rubs, or gallops.  ABDOMEN: Soft, nontender, nondistended. Bowel sounds present. No organomegaly or mass.  EXTREMITIES: No cyanosis, clubbing or edema b/l.    NEUROLOGIC: moves all extremities well, Parkinson's facies+ PSYCHIATRIC:  patient is alert but not conversive--says few words SKIN: No obvious rash, lesion, or ulcer.   LABORATORY PANEL:  CBC Recent Labs  Lab 11/22/19 0507  WBC 15.4*  HGB 11.4*  HCT 34.8*  PLT 183    Chemistries  Recent Labs  Lab 11/21/19 1614 11/22/19 0507 11/24/19 0446  NA 141   < > 149*  K 3.2*   < > 3.7   CL 112*   < > 121*  CO2 22   < > 24  GLUCOSE 161*   < > 142*  BUN 59*   < > 37*  CREATININE 2.53*   < > 1.65*  CALCIUM 8.3*   < > 8.7*  AST 21  --   --   ALT 5  --   --   ALKPHOS 72  --   --   BILITOT 1.2  --   --    < > = values in this interval not displayed.   Cardiac Enzymes No results for input(s): TROPONINI in the last 168 hours. RADIOLOGY:  No results found. ASSESSMENT AND PLAN:   JAQUELL BURBA is a 74 y.o. male with medical history significant of advanced Parkinson's disease, BPH, HTN, PSVT. His attentive wife and care-giver noted that he was weaker than usual, staying in the bed and had a low grade fever at home. She was concerned for possible infection and brought him to [facility]ED for evaluation   1.Ecoli Sepsis with Acute on Chornic renal failure due to ATN/dehydration --source urine -patient came in with fever 101, heart rate in the 90s, elevated white count 12.4  and positive U/A. Present on admission  -sepsis improved -this is most likely cause of increased weakness and fever.  -BC ID positive for Ecoli and Staph species (contamination) -IV Meropenem--change to IV Rocephin -cont IV fluids -w/f aspiration  2. AKI on chronic kidney disease with Hypernatremia -  Creatinine baseline 1.3-1.4  - per wife patient with poor intake over the past several days.  -His rise in creatinine and BUN suggest prerenal azotemia/ATN with sepsis -continue IV hydration-avoid nephrotoxic agents  -monitor creatinine  -creat 2.53--2.1--1.65 -change IVF to d5  With elevated sodium  3. Parkinson's disease - advanced with associated dementia. - He is at high risk for aspiration. -Continue home medications Sinemet -SLP to evaluate swallow:-- appreciate input.  4. HTN- continue home meds  5. Code status - discussed with wife, HCPOA, and he is DNR prior to admission  Pt is at a very high risk for readmission given multiple co-morbidities. This was discussed with pt's wife and she  understands overall long term poor prognosis    DVT prophylaxis: lovenox Code Status: DNR Family Communication: Spoke with Karle Plumber, wife.  Answered all questions she is requesting pt go to rehab. Has done well per her in the past TOC aware Disposition Plan: to rehab--prefers [facility] called: none  Admission status: inpatient   Status is: Inpatient  Remains inpatient appropriate because:Inpatient level of care appropriate due to severity of illness sepsis due to UTI, renal failure, hypernatremia. Overall slowly improving.Needs 1-2 days of IVF to correct electrolytes   Dispo: The patient is from: Home              Anticipated d/c is to: TBD              Anticipated d/c date is: > 3 days              Patient currently is not medically stable to d/c.   TOTAL TIME TAKING CARE OF THIS PATIENT: *25* minutes.  >50% time spent on counselling and coordination of care  Note: This dictation was prepared with Dragon dictation along with smaller phrase technology. Any transcriptional errors that result from this process are unintentional.  Fritzi Mandes M.D    [facility]   CC: Primary care physician; [name], MDPatient ID: Beola Cord, male   DOB: 1946/06/02, 74 y.o.   MRN: [id number]

## 2019-11-25 LAB — BASIC METABOLIC PANEL
Anion gap: 8 (ref 5–15)
BUN: 31 mg/dL — ABNORMAL HIGH (ref 8–23)
CO2: 21 mmol/L — ABNORMAL LOW (ref 22–32)
Calcium: 8.5 mg/dL — ABNORMAL LOW (ref 8.9–10.3)
Chloride: 117 mmol/L — ABNORMAL HIGH (ref 98–111)
Creatinine, Ser: 1.51 mg/dL — ABNORMAL HIGH (ref 0.61–1.24)
GFR calc Af Amer: 52 mL/min — ABNORMAL LOW (ref >60)
GFR calc non Af Amer: 45 mL/min — ABNORMAL LOW (ref >60)
Glucose, Bld: 176 mg/dL — ABNORMAL HIGH (ref 70–99)
Potassium: 3.5 mmol/L (ref 3.5–5.1)
Sodium: 146 mmol/L — ABNORMAL HIGH (ref 135–145)

## 2019-11-25 MED ORDER — DEXTROSE 5 % IV SOLN: INTRAVENOUS | Status: DC

## 2019-11-25 NOTE — Progress Notes (Signed)
[place] at [place] NAME: Brandon Manning    MR#:  [id number]  DATE OF BIRTH:  May 29, 1946  SUBJECTIVE:  patient awakens on VC. Resting quietly--says a few words to me.  No Fever. No acute issues per RN Eating well  REVIEW OF SYSTEMS:   Review of Systems  Unable to perform ROS: Dementia   Tolerating Diet:yes Tolerating PT: SNF  DRUG ALLERGIES:   Allergies  Allergen Reactions  . Penicillins Rash    Has patient had a PCN reaction causing immediate rash, facial/tongue/throat swelling, SOB or lightheadedness with hypotension: No Has patient had a PCN reaction causing severe rash involving mucus membranes or skin necrosis: No Has patient had a PCN reaction that required hospitalization No Has patient had a PCN reaction occurring within the last 10 years: No If all of the above answers are "NO", then may proceed with Cephalosporin use.    VITALS:  Blood pressure (!) 155/85, pulse 98, temperature 99.4 F (37.4 C), temperature source Oral, resp. rate (!) 22, height 5\' 8"  (1.727 m), weight 79 kg, SpO2 99 %.  PHYSICAL EXAMINATION:   Physical Exam Limited exam GENERAL:  74 y.o.-year-old patient lying in the bed with no acute distress.   LUNGS: Normal breath sounds bilaterally, no wheezing, rales, rhonchi. No use of accessory muscles of respiration.  CARDIOVASCULAR: S1, S2 normal. No murmurs, rubs, or gallops.  ABDOMEN: Soft, nontender, nondistended. Bowel sounds present. No organomegaly or mass.  EXTREMITIES: No cyanosis, clubbing or edema b/l.    NEUROLOGIC: moves all extremities well, Parkinson's facies+ PSYCHIATRIC:  patient is alert but not conversive--says few words SKIN: No obvious rash, lesion, or ulcer.   LABORATORY PANEL:  CBC Recent Labs  Lab 11/22/19 0507  WBC 15.4*  HGB 11.4*  HCT 34.8*  PLT 183    Chemistries  Recent Labs  Lab 11/21/19 1614 11/22/19 0507 11/24/19 0446  NA 141   < > 149*  K 3.2*   < > 3.7   CL 112*   < > 121*  CO2 22   < > 24  GLUCOSE 161*   < > 142*  BUN 59*   < > 37*  CREATININE 2.53*   < > 1.65*  CALCIUM 8.3*   < > 8.7*  AST 21  --   --   ALT 5  --   --   ALKPHOS 72  --   --   BILITOT 1.2  --   --    < > = values in this interval not displayed.   Cardiac Enzymes No results for input(s): TROPONINI in the last 168 hours. RADIOLOGY:  No results found. ASSESSMENT AND PLAN:   Brandon Manning is a 74 y.o. male with medical history significant of advanced Parkinson's disease, BPH, HTN, PSVT. His attentive wife and care-giver noted that he was weaker than usual, staying in the bed and had a low grade fever at home. She was concerned for possible infection and brought him to [facility] for evaluation   1.Ecoli Sepsis with Acute on Chornic renal failure due to ATN/dehydration --source urine -patient came in with fever 101, heart rate in the 90s, elevated white count 12.4  and positive U/A. Present on admission  -sepsis improved -BC ID positive for Ecoli and Staph species (contamination) -IV Meropenem--change to IV Rocephin--to po cefdinir tomorrow -cont IV fluids -w/f aspiration  2. AKI on chronic kidney disease with Hypernatremia - Creatinine baseline 1.3-1.4  - per wife  patient with poor intake over the past several days.  -His rise in creatinine and BUN suggest prerenal azotemia/ATN with sepsis -continue IV hydration-avoid nephrotoxic agents  -monitor creatinine  -creat 2.53--2.1--1.65 -change IVF to d5  With elevated sodium 153--149--BMP today  3. Parkinson's disease - advanced with associated dementia. - He is at high risk for aspiration. -Continue home medications Sinemet -SLP to evaluate swallow:-- appreciate input.  4. HTN- continue home meds  5. Code status - discussed with wife, HCPOA, and he is DNR prior to admission  Pt is at a very high risk for readmission given multiple co-morbidities. This was discussed with pt's wife and she understands overall  long term poor prognosis    DVT prophylaxis: lovenox Code Status: DNR Family Communication: Spoke with Karle Plumber, wife.  Answered all questions she is requesting pt go to rehab. Has done well per her in the past TOC aware Disposition Plan: to rehab--prefers [facility] called: none  Admission status: inpatient   Status is: Inpatient  Remains inpatient appropriate because:Inpatient level of care appropriate due to severity of illness sepsis due to UTI, renal failure, hypernatremia. Overall slowly improving.Needs 1-2 days of IVF to correct electrolytes   Dispo: The patient is from: Home              Anticipated d/c is to: rehab              Anticipated d/c date is:may 10th              Patient currently is not medically stable to d/c.   TOTAL TIME TAKING CARE OF THIS PATIENT: *25* minutes.  >50% time spent on counselling and coordination of care  Note: This dictation was prepared with Dragon dictation along with smaller phrase technology. Any transcriptional errors that result from this process are unintentional.  Fritzi Mandes M.D    [facility]   CC: Primary care physician; [name], MDPatient ID: Brandon Manning, male   DOB: 15-Dec-1945, 74 y.o.   MRN: [id number]

## 2019-11-26 LAB — BASIC METABOLIC PANEL
Anion gap: 7 (ref 5–15)
BUN: 29 mg/dL — ABNORMAL HIGH (ref 8–23)
CO2: 23 mmol/L (ref 22–32)
Calcium: 8.4 mg/dL — ABNORMAL LOW (ref 8.9–10.3)
Chloride: 113 mmol/L — ABNORMAL HIGH (ref 98–111)
Creatinine, Ser: 1.37 mg/dL — ABNORMAL HIGH (ref 0.61–1.24)
GFR calc Af Amer: 58 mL/min — ABNORMAL LOW (ref >60)
GFR calc non Af Amer: 50 mL/min — ABNORMAL LOW (ref >60)
Glucose, Bld: 161 mg/dL — ABNORMAL HIGH (ref 70–99)
Potassium: 3.4 mmol/L — ABNORMAL LOW (ref 3.5–5.1)
Sodium: 143 mmol/L (ref 135–145)

## 2019-11-26 MED ORDER — CEPHALEXIN 500 MG PO CAPS
500.0000 mg | ORAL_CAPSULE | Freq: Four times a day (QID) | ORAL | Status: DC
  Administered 2019-11-26: 13:00:00 500 mg via ORAL
  Filled 2019-11-26: qty 1

## 2019-11-26 MED ORDER — LEVOFLOXACIN 500 MG PO TABS
500.0000 mg | ORAL_TABLET | Freq: Every day | ORAL | Status: DC
  Administered 2019-11-27: 09:00:00 500 mg via ORAL
  Filled 2019-11-26: qty 1

## 2019-11-26 NOTE — Progress Notes (Signed)
[place] at [place] NAME: Brandon Manning    MR#:  [id number]  DATE OF BIRTH:  12-01-1945  SUBJECTIVE:  patient awakens on VC. Resting quietly--says a few words to me.  No Fever. No acute issues per RN Eating well per staff  REVIEW OF SYSTEMS:   Review of Systems  Unable to perform ROS: Dementia   Tolerating Diet:yes Tolerating PT: SNF  DRUG ALLERGIES:   Allergies  Allergen Reactions  . Penicillins Rash    Has patient had a PCN reaction causing immediate rash, facial/tongue/throat swelling, SOB or lightheadedness with hypotension: No Has patient had a PCN reaction causing severe rash involving mucus membranes or skin necrosis: No Has patient had a PCN reaction that required hospitalization No Has patient had a PCN reaction occurring within the last 10 years: No If all of the above answers are "NO", then may proceed with Cephalosporin use.    VITALS:  Blood pressure (!) 164/93, pulse 93, temperature 99.5 F (37.5 C), temperature source Oral, resp. rate 18, height 5\' 8"  (1.727 m), weight 79 kg, SpO2 100 %.  PHYSICAL EXAMINATION:   Physical Exam Limited exam GENERAL:  74 y.o.-year-old patient lying in the bed with no acute distress.   LUNGS: Normal breath sounds bilaterally, no wheezing, rales, rhonchi. No use of accessory muscles of respiration.  CARDIOVASCULAR: S1, S2 normal. No murmurs, rubs, or gallops.  ABDOMEN: Soft, nontender, nondistended. Bowel sounds present. No organomegaly or mass.  EXTREMITIES: No cyanosis, clubbing or edema b/l.    NEUROLOGIC: moves all extremities well, Parkinson's facies+ PSYCHIATRIC:  patient is alert but not conversive--says few words SKIN: No obvious rash, lesion, or ulcer.   LABORATORY PANEL:  CBC Recent Labs  Lab 11/22/19 0507  WBC 15.4*  HGB 11.4*  HCT 34.8*  PLT 183    Chemistries  Recent Labs  Lab 11/21/19 1614 11/22/19 0507 11/26/19 0518  NA 141   < > 143  K 3.2*   < >  3.4*  CL 112*   < > 113*  CO2 22   < > 23  GLUCOSE 161*   < > 161*  BUN 59*   < > 29*  CREATININE 2.53*   < > 1.37*  CALCIUM 8.3*   < > 8.4*  AST 21  --   --   ALT 5  --   --   ALKPHOS 72  --   --   BILITOT 1.2  --   --    < > = values in this interval not displayed.   Cardiac Enzymes No results for input(s): TROPONINI in the last 168 hours. RADIOLOGY:  No results found. ASSESSMENT AND PLAN:   Brandon Manning is a 74 y.o. male with medical history significant of advanced Parkinson's disease, BPH, HTN, PSVT. His attentive wife and care-giver noted that he was weaker than usual, staying in the bed and had a low grade fever at home. She was concerned for possible infection and brought him to [facility] for evaluation   1.Ecoli Sepsis with Acute on Chornic renal failure due to ATN/dehydration --source urine -patient came in with fever 101, heart rate in the 90s, elevated white count 12.4  and positive U/A. Present on admission  -sepsis improved -BC ID positive for Ecoli and Staph species (contamination) -IV Meropenem--change to IV Rocephin--to po keflex for 2 more days (total 7) -cont IV fluids -w/f aspiration  2. AKI on chronic kidney disease with Hypernatremia - Creatinine  baseline 1.3-1.4  - per wife patient with poor intake over the past several days.  -His rise in creatinine and BUN suggest prerenal azotemia/ATN with sepsis -continue IV hydration-avoid nephrotoxic agents  -monitor creatinine  -creat 2.53--2.1--1.65--1.3 -change IVF to d5  With elevated sodium 153--149--143--d/c IVF  3. Parkinson's disease - advanced with associated dementia. - He is at high risk for aspiration. -Continue home medications Sinemet -SLP -- appreciate input.  4. HTN- continue home meds  5. Code status - discussed with wife, HCPOA, and he is DNR prior to admission  Pt is at a very high risk for readmission given multiple co-morbidities. This was discussed with pt's wife and she understands  overall long term poor prognosis    DVT prophylaxis: lovenox Code Status: DNR Family Communication: Spoke with Karle Plumber, wife.  Answered all questions she is requesting pt go to rehab. Has done well per her in the past TOC aware-- waiting insurance authorization. Disposition Plan: to rehab--prefers [facility] called: none  Admission status: inpatient   Status is: Inpatient   Dispo: The patient is from: Home              Anticipated d/c is to: rehab              Anticipated d/c date is:may 10th              Patient currently is medically best at baseline for discharge. Awaiting insurance authorization   TOTAL TIME TAKING CARE OF THIS PATIENT: *25* minutes.  >50% time spent on counselling and coordination of care  Note: This dictation was prepared with Dragon dictation along with smaller phrase technology. Any transcriptional errors that result from this process are unintentional.  Fritzi Mandes M.D    [facility]   CC: Primary care physician; [name], MDPatient ID: Brandon Manning, male   DOB: August 18, 1945, 74 y.o.   MRN: [id number]

## 2019-11-26 NOTE — Progress Notes (Signed)
Physical Therapy Treatment Patient Details Name: Brandon Manning MRN: [id number] DOB: 1946/03/26 Today's Date: 11/26/2019    History of Present Illness Per MD notes: Pt is a 73 y.o. male with medical history significant of advanced Parkinson's disease, BPH, HTN, PSVT.   MD assessment includes: UTI, AKI, and HTN.    PT Comments    Pt continued to require significant physical assistance with all functional mobility tasks this session but did make some small progress towards goals.  Pt was able to perform sit to/from stand transfers with +2 mod A but was unable to come to full upright standing.  Once the pt was in standing, however, he was able to maintain static standing position with B knees and trunk flexed without physical assistance x 30 sec before requiring to return to sitting.  Pt continues to present with functional deficits that would make returning to his prior living situation unsafe and will benefit from a trial of PT services in a SNF setting upon discharge to safely address deficits listed in patient problem list for decreased caregiver assistance and hopeful return to PLOF.   Follow Up Recommendations  SNF     Equipment Recommendations  Other (comment)(TBD at next venue of care)    Recommendations for Other Services       Precautions / Restrictions Precautions Precautions: Fall Restrictions Weight Bearing Restrictions: No    Mobility  Bed Mobility Overal bed mobility: Needs Assistance Bed Mobility: Rolling;Supine to Sit;Sit to Supine Rolling: Mod assist   Supine to sit: Max assist Sit to supine: +2 for physical assistance;Max assist   General bed mobility comments: Increased effort from patient this session with pt able to maintain static sitting balance without physical assist  Transfers Overall transfer level: Needs assistance Equipment used: Rolling walker (2 wheeled) Transfers: Sit to/from Stand Sit to Stand: +2 physical assistance;Mod assist;From elevated  surface         General transfer comment: Pt unable to come to full upright standing but once up was able to maintain static standing position with B knees and trunk flexed without physical assistance  Ambulation/Gait             General Gait Details: Unable   Stairs             Wheelchair Mobility    Modified Rankin (Stroke Patients Only)       Balance Overall balance assessment: Needs assistance   Sitting balance-Leahy Scale: Fair Sitting balance - Comments: Pt able to maintain static sitting balance without physical assist this session     Standing balance-Leahy Scale: Poor Standing balance comment: Heavy reliance on the RW for support in standing                            Cognition Arousal/Alertness: Lethargic Behavior During Therapy: Flat affect Overall Cognitive Status: No family/caregiver present to determine baseline cognitive functioning                                        Exercises Total Joint Exercises Ankle Circles/Pumps: AAROM;Both;10 reps;15 reps Short Arc Quad: AAROM;Both;15 reps;10 reps Heel Slides: AAROM;Both;10 reps Hip ABduction/ADduction: AAROM;Both;10 reps Straight Leg Raises: AAROM;Both;10 reps Long Arc Quad: AAROM;Both;10 reps Knee Flexion: AAROM;Both;10 reps Other Exercises Other Exercises: Static sitting at EOB x 5 min for improved core strength and activity tolerance  General Comments        Pertinent Vitals/Pain Pain Assessment: No/denies pain    Home Living                      Prior Function            PT Goals (current goals can now be found in the care plan section) Progress towards PT goals: Progressing toward goals    Frequency    Min 2X/week      PT Plan Current plan remains appropriate    Co-evaluation              AM-PAC PT "6 Clicks" Mobility   Outcome Measure  Help needed turning from your back to your side while in a flat bed without using  bedrails?: A Lot Help needed moving from lying on your back to sitting on the side of a flat bed without using bedrails?: A Lot Help needed moving to and from a bed to a chair (including a wheelchair)?: Total Help needed standing up from a chair using your arms (e.g., wheelchair or bedside chair)?: A Lot Help needed to walk in hospital room?: Total Help needed climbing 3-5 steps with a railing? : Total 6 Click Score: 9    End of Session Equipment Utilized During Treatment: Gait belt Activity Tolerance: Patient tolerated treatment well Patient left: in bed;with call bell/phone within reach;with bed alarm set Nurse Communication: Mobility status PT Visit Diagnosis: Muscle weakness (generalized) (M62.81);Difficulty in walking, not elsewhere classified (R26.2)     Time: [id number] PT Time Calculation (min) (ACUTE ONLY): 23 min  Charges:  $Therapeutic Exercise: 8-22 mins $Therapeutic Activity: 8-22 mins                     D. Scott Kenyia [place] PT, DPT 11/26/19, 2:09 PM

## 2019-11-26 NOTE — Progress Notes (Signed)
Speech Language Pathology Treatment: Dysphagia  Patient Details Name: Brandon Manning MRN: [id number] DOB: [birth year] Today's Date: 11/26/2019 Time: [id number] SLP Time Calculation (min) (ACUTE ONLY): 40 min  Assessment / Plan / Recommendation Clinical Impression  Pt seen today for ongoing assessment of toleration of dysphagia diet, trials to upgrade if appropriate and safe for pt. Pt is tolerating current dysphagia diet well and eating/drinking increased amounts. He continues to be more interactive w/ few verbalizations during one on one time w/ him; he continues to required Mod+ verbal/tactile cues to follow through w/ any commands or instructions. Suspect pt's baseline Advanced Parkinson's Dis. And Dementia are directly impacting his overall awareness and engagement w/ tasks, including po tasks. Any Neurological diseases can increase risk for dysphagia and aspiration thus Pulmonary impact.  Pt continues to present w/ oropharyngeal phase dysphagia w/ overt, clinical s/s of aspiration noted w/ thin liquids despite aspiration precautions implemented and feeding support given by SLP. Suspect impact from pt's declined Neurological status' impacting neuromuscular swallowing abilities. He required Mod+ verbal/tactile cues to increase awareness and follow through w/ po tasks. Pt demonstrated overt coughing w/ trials of thin liquids; though not consistently. SLP practiced small sips via Cup w/ pt and giving verbal/tactile cues -- he often continued to exhibit slurping, quick sips w/ some larger than others followed by coughing. No overt clinical s/s of aspiration were noted w/ trials of Nectar liquids and purees. More timely and coordinated bolus management was noted w/ trials of Nectar consistency liquids; no decline in respiratory effort during/post trials. Oral phase appeared adequate for bolus management and clearing w/ modified textured foods/liquids. Noted munching behavior w/ the increased textured foods  has been noted w/ slows the oral phase timing of swallowing. Time was given b/t trials to ensure oral clearing. Suspect the Minced foods diet continues to be beneficial for pt's conservation of energy and attention to task. Dentures in place. Pt continues to required Mod-Max assistance and cues w/ all oral intake and oral care; Denture care.  Recommend a Dysphagia level 2(minced foods) diet w/ added purees; Nectar consistency liquids for safer swallowing and to reduce risk for aspiration events. Recommend aspiration precautions; Pills in Puree - Crushed as able. Full feeding assistance at all meals.  REcommend a Palliative Care consult to discuss [place] and quality of life wishes -- this may include wanting to have Pleasure sips of Water (post thorough oral care) w/ NSG supervision at his Next venue of care (SNF vs home). ST services will f/u continued education as needed while admitted; NSG/MD updated.    HPI HPI: Pt is a 74 y.o. male with medical history significant of advanced Parkinson's disease, BPH, HTN, PSVT. His attentive wife and care-giver noted that he was weaker than usual, staying in the bed and had a low grade fever at home. She was concerned for possible infection and brought him to [facility] for evaluation.  CXR: "CXR with ill-defined opacities bilaterally suggestive of PNA vs pulmonary edema".  Pt admitted for UTI, dehydration.        SLP Plan  Continue with current plan of care(though pt may be at his baseline)       Recommendations  Diet recommendations: Dysphagia 2 (fine chop);Nectar-thick liquid Liquids provided via: Cup(monitor any straw use w/ Nectar liquids) Medication Administration: Crushed with puree(as able for safer swallowing) Supervision: Staff to assist with self feeding;Full supervision/cueing for compensatory strategies Compensations: Minimize environmental distractions;Slow rate;Small sips/bites;Follow solids with liquid Postural Changes and/or Swallow Maneuvers:  Seated upright  90 degrees;Upright 30-60 min after meal                General recommendations: (Dietician f/u; Palliative Care consult for [place]) Oral Care Recommendations: Oral care BID;Oral care before and after PO;Staff/trained caregiver to provide oral care Follow up Recommendations: Skilled Nursing facility(TBD) SLP Visit Diagnosis: Dysphagia, oropharyngeal phase (R13.12) Plan: Continue with current plan of care(though pt may be at his baseline)       [place], [place], CCC-SLP Gabrielle Wakeland 11/26/2019, 12:44 PM

## 2019-11-26 NOTE — Plan of Care (Signed)

## 2019-11-26 NOTE — Care Management Important Message (Signed)
Important Message  Patient Details  Name: Brandon Manning MRN: [id number] Date of Birth: 1946-01-17   Medicare Important Message Given:  Yes     Juliann Pulse A Arlin Sass 11/26/2019, 11:27 AM

## 2019-11-27 DIAGNOSIS — D649 Anemia, unspecified: Secondary | ICD-10-CM | POA: Diagnosis not present

## 2019-11-27 DIAGNOSIS — R6520 Severe sepsis without septic shock: Secondary | ICD-10-CM | POA: Diagnosis not present

## 2019-11-27 DIAGNOSIS — R404 Transient alteration of awareness: Secondary | ICD-10-CM | POA: Diagnosis not present

## 2019-11-27 DIAGNOSIS — F0281 Dementia in other diseases classified elsewhere with behavioral disturbance: Secondary | ICD-10-CM | POA: Diagnosis not present

## 2019-11-27 DIAGNOSIS — J189 Pneumonia, unspecified organism: Secondary | ICD-10-CM | POA: Diagnosis not present

## 2019-11-27 DIAGNOSIS — W19XXXA Unspecified fall, initial encounter: Secondary | ICD-10-CM | POA: Diagnosis not present

## 2019-11-27 DIAGNOSIS — G20 Parkinson's disease: Secondary | ICD-10-CM | POA: Diagnosis not present

## 2019-11-27 DIAGNOSIS — E538 Deficiency of other specified B group vitamins: Secondary | ICD-10-CM | POA: Diagnosis not present

## 2019-11-27 DIAGNOSIS — I10 Essential (primary) hypertension: Secondary | ICD-10-CM | POA: Diagnosis not present

## 2019-11-27 DIAGNOSIS — N189 Chronic kidney disease, unspecified: Secondary | ICD-10-CM | POA: Diagnosis not present

## 2019-11-27 DIAGNOSIS — M2550 Pain in unspecified joint: Secondary | ICD-10-CM | POA: Diagnosis not present

## 2019-11-27 DIAGNOSIS — N179 Acute kidney failure, unspecified: Secondary | ICD-10-CM | POA: Diagnosis not present

## 2019-11-27 DIAGNOSIS — F329 Major depressive disorder, single episode, unspecified: Secondary | ICD-10-CM | POA: Diagnosis not present

## 2019-11-27 DIAGNOSIS — N170 Acute kidney failure with tubular necrosis: Secondary | ICD-10-CM | POA: Diagnosis not present

## 2019-11-27 DIAGNOSIS — Z7401 Bed confinement status: Secondary | ICD-10-CM | POA: Diagnosis not present

## 2019-11-27 DIAGNOSIS — F0280 Dementia in other diseases classified elsewhere without behavioral disturbance: Secondary | ICD-10-CM | POA: Diagnosis not present

## 2019-11-27 DIAGNOSIS — A4151 Sepsis due to Escherichia coli [E. coli]: Secondary | ICD-10-CM | POA: Diagnosis not present

## 2019-11-27 DIAGNOSIS — B9620 Unspecified Escherichia coli [E. coli] as the cause of diseases classified elsewhere: Secondary | ICD-10-CM | POA: Diagnosis not present

## 2019-11-27 DIAGNOSIS — N390 Urinary tract infection, site not specified: Secondary | ICD-10-CM | POA: Diagnosis not present

## 2019-11-27 DIAGNOSIS — E870 Hyperosmolality and hypernatremia: Secondary | ICD-10-CM | POA: Diagnosis not present

## 2019-11-27 DIAGNOSIS — I129 Hypertensive chronic kidney disease with stage 1 through stage 4 chronic kidney disease, or unspecified chronic kidney disease: Secondary | ICD-10-CM | POA: Diagnosis not present

## 2019-11-27 LAB — SARS CORONAVIRUS 2 BY RT PCR (HOSPITAL ORDER, PERFORMED IN ~~LOC~~ HOSPITAL LAB): SARS Coronavirus 2: NEGATIVE

## 2019-11-27 MED ORDER — LEVOFLOXACIN 500 MG PO TABS: 500.0000 mg | ORAL_TABLET | Freq: Every day | ORAL | 0 refills | Status: AC

## 2019-11-27 NOTE — Discharge Instructions (Signed)
Diet recommendations: Dysphagia 2 (fine chop);Nectar-thick liquid Liquids provided via: Cup Medication Administration: Crushed with puree Supervision: Staff to assist with self feeding;Full supervision/cueing for compensatory strategies Compensations: Minimize environmental distractions;Slow rate;Small sips/bites Postural Changes and/or Swallow Maneuvers: Seated upright 90 degrees

## 2019-11-27 NOTE — Progress Notes (Signed)
Patient discharged via EMS to [facility] with personal belongings, discharge summary given to EMS,. VSS. Patient IV removed from [facility]. Called to update patients wife of transfer

## 2019-11-27 NOTE — TOC Progression Note (Signed)
Transition of Care [facility]) - Progression Note    Patient Details  Name: Brandon Manning MRN: [id number] Date of Birth: 1945-07-31  Transition of Care [facility]) CM/SW Contact  Su Hilt, RN Phone Number: 11/27/2019, 8:35 AM  Clinical Narrative:     Checked with Magda Paganini at [facility] to inquire about auth status, awaiting and answer       Expected Discharge Plan and Services           Expected Discharge Date: 11/27/19                                     Social Determinants of Health (SDOH) Interventions    Readmission Risk Interventions No flowsheet data found.

## 2019-11-27 NOTE — TOC Transition Note (Signed)
Transition of Care [facility]) - CM/SW Discharge Note   Patient Details  Name: Brandon Manning MRN: [id number] Date of Birth: [birth year]  Transition of Care [facility]) CM/SW Contact:  Su Hilt, RN Phone Number: 11/27/2019, 9:02 AM   Clinical Narrative:     The patient is to DC today to [facility] was approved, he will go to room 502 Called the spouse [name] at [phone] And notified her of the discharge and the room number at [facility],  Once the Rapid Covid test comes back will transport via EMS, The DC packet is on the cahrt and the bedside nurse is aware       Patient Goals and CMS Choice        Discharge Placement                       Discharge Plan and Services                                     Social Determinants of Health (SDOH) Interventions     Readmission Risk Interventions No flowsheet data found.

## 2019-11-27 NOTE — TOC Transition Note (Signed)
Transition of Care [facility]) - CM/SW Discharge Note   Patient Details  Name: Brandon Manning MRN: [id number] Date of Birth: Dec 12, 1945  [facility] of Care [facility]) CM/SW Contact:  Su Hilt, RN Phone Number: 11/27/2019, 2:32 PM   Clinical Narrative:    Patient to go to [place] room [place] patient's wife was notified EMS was called by [facility] and there are 2 ahead of the patient, the bed side nurse has called report   Final next level of care: Skilled Nursing Facility Barriers to Discharge: Continued Medical Work up   Patient Goals and CMS Choice        Discharge Placement              Patient chooses bed at: [facility] Patient to be transferred to facility by: EMS Name of family member notified: [name] Patient and family notified of of transfer: 11/27/19  Discharge Plan and Services                                     Social Determinants of Health (SDOH) Interventions     Readmission Risk Interventions No flowsheet data found.

## 2019-11-27 NOTE — Discharge Summary (Signed)
Brandon Manning at [place] NAME: Brandon Manning    MR#:  [id number]  DATE OF BIRTH:  12-18-1945  DATE OF ADMISSION:  11/21/2019 ADMITTING PHYSICIAN: Neena Rhymes, MD  DATE OF DISCHARGE: 11/27/2019  PRIMARY CARE PHYSICIAN: [name], MD    ADMISSION DIAGNOSIS:  Acute UTI [N39.0] Urinary tract infection without hematuria, site unspecified [N39.0]  DISCHARGE DIAGNOSIS:  E. coli sepsis due to UTI Acute on chronic renal failure secondary to ATN/dehydration improved Hypernatremia secondary to dehydration  SECONDARY DIAGNOSIS:   Past Medical History:  Diagnosis Date  . Constipation   . ED (erectile dysfunction)   . Enlarged prostate   . History of chicken pox   . HTN (hypertension) 09/09/2011  . Parkinson disease ([place]) 02/01/2014   Has been prescribed rollator walker Established with Dr Tat (01/2016)     HOSPITAL COURSE:    Brandon Altice Johnsonis a 74 y.o.malewith medical history significant ofadvanced Parkinson's disease, BPH, HTN, PSVT. His attentive wife and care-giver noted that he was weaker than usual, staying in the bed and had a low grade fever at home. She was concerned for possible infection and brought him to [facility] for evaluation   1.Ecoli Sepsis with Acute on Chornic renal failure due to ATN/dehydration --source urine -patient came in with fever 101, heart rate in the 90s, elevated white count 12.4  and positive U/A. Present on admission  -sepsis improved -BC ID positive for Ecoli and Staph species (contamination) -IV Meropenem--change to IV Rocephin--to po levaquin for 2 more days (total 7) -received IV fluids -w/f aspiration  2. AKI on chronic kidney disease with Hypernatremia - Creatinine baseline 1.3-1.4  - per wife patient with poor intake over the past several days.  -His rise in creatinine and BUN suggest prerenal azotemia/ATN with sepsis -continue IV hydration-avoid nephrotoxic agents  -monitor creatinine   -creat 2.53--2.1--1.65--1.3 -change IVF to d5  With elevated sodium 153--149--143--d/c IVF  3. Parkinson's disease - advanced with associated dementia. - He is at high risk for aspiration. -Continue home medications Sinemet -SLP -- appreciate input.  4. HTN- continue home meds  5. Code status - discussed with wife, HCPOA, and he is DNR prior to admission  Pt is at a very high risk for readmission given multiple co-morbidities. This was discussed with pt's wife and she understands overall long term poor prognosis   DVT prophylaxis:lovenox Code Status:DNR Family Communication:Spoke with Karle Plumber, wife.  Answered all questionsshe is requesting pt go to rehab. Has done well per her in the past TOC aware-- waiting insurance authorization. Disposition Plan:to rehab--prefers Dietitian called:none Admission status:inpatient  Status is: Inpatient   Dispo: The patient is from: Home  Anticipated d/c is to: rehab  Anticipated d/c date is:may 10th   Patient currently is medically best at baseline for discharge. Awaiting insurance authorization CONSULTS OBTAINED:    DRUG ALLERGIES:   Allergies  Allergen Reactions  . Penicillins Rash    Has patient had a PCN reaction causing immediate rash, facial/tongue/throat swelling, SOB or lightheadedness with hypotension: No Has patient had a PCN reaction causing severe rash involving mucus membranes or skin necrosis: No Has patient had a PCN reaction that required hospitalization No Has patient had a PCN reaction occurring within the last 10 years: No If all of the above answers are "NO", then may proceed with Cephalosporin use.    DISCHARGE MEDICATIONS:   Allergies as of 11/27/2019      Reactions   Penicillins  Rash   Has patient had a PCN reaction causing immediate rash, facial/tongue/throat swelling, SOB or lightheadedness with hypotension: No Has patient had a  PCN reaction causing severe rash involving mucus membranes or skin necrosis: No Has patient had a PCN reaction that required hospitalization No Has patient had a PCN reaction occurring within the last 10 years: No If all of the above answers are "NO", then may proceed with Cephalosporin use.      Medication List    TAKE these medications   amLODipine 2.5 MG tablet Commonly known as: NORVASC Take 1 tablet (2.5 mg total) by mouth daily.   aspirin EC 81 MG tablet Take 81 mg by mouth every other day.   carbidopa-levodopa 25-100 MG tablet Commonly known as: SINEMET IR TAKE 2 TABLETS AT 7AM, 2 TABLETS AT NOON AND  1 TABLET AT 4PM   finasteride 5 MG tablet Commonly known as: Proscar Take 1 tablet (5 mg total) by mouth daily.   levofloxacin 500 MG tablet Commonly known as: LEVAQUIN Take 1 tablet (500 mg total) by mouth daily.   MULTIVITAMIN PO Take 1 tablet by mouth daily.   polyethylene glycol 17 g packet Commonly known as: MIRALAX / GLYCOLAX Take 17 g by mouth daily.   sertraline 25 MG tablet Commonly known as: Zoloft Take 1 tablet (25 mg total) by mouth daily.   vitamin B-12 500 MCG tablet Commonly known as: CYANOCOBALAMIN Take 1 tablet (500 mcg total) by mouth daily.   Vitamin D3 1.25 MG (50000 UT) Tabs Take 1 tablet by mouth once a week.       If you experience worsening of your admission symptoms, develop shortness of breath, life threatening emergency, suicidal or homicidal thoughts you must seek medical attention immediately by calling 911 or calling your MD immediately  if symptoms less severe.  You Must read complete instructions/literature along with all the possible adverse reactions/side effects for all the Medicines you take and that have been prescribed to you. Take any new Medicines after you have completely understood and accept all the possible adverse reactions/side effects.   Please note  You were cared for by a hospitalist during your hospital stay.  If you have any questions about your discharge medications or the care you received while you were in the hospital after you are discharged, you can call the unit and asked to speak with the hospitalist on call if the hospitalist that took care of you is not available. Once you are discharged, your primary care physician will handle any further medical issues. Please note that NO REFILLS for any discharge medications will be authorized once you are discharged, as it is imperative that you return to your primary care physician (or establish a relationship with a primary care physician if you do not have one) for your aftercare needs so that they can reassess your need for medications and monitor your lab values. Today   SUBJECTIVE   No new complaints   VITAL SIGNS:  Blood pressure (!) 153/90, pulse 95, temperature 98.7 F (37.1 C), temperature source Oral, resp. rate 18, height 5\' 8"  (1.727 m), weight 79 kg, SpO2 100 %.  I/O:    Intake/Output Summary (Last 24 hours) at 11/27/2019 0825 Last data filed at 11/26/2019 1630 Gross per 24 hour  Intake 1533.08 ml  Output 1450 ml  Net 83.08 ml    PHYSICAL EXAMINATION:  GENERAL:  74 y.o.-year-old patient lying in the bed with no acute distress.  EYES: Pupils equal, round,  reactive to light and accommodation. No scleral icterus.  HEENT: Head atraumatic, normocephalic. Oropharynx and nasopharynx clear.  NECK:  Supple, no jugular venous distention. No thyroid enlargement, no tenderness.  LUNGS: Normal breath sounds bilaterally, no wheezing, rales,rhonchi or crepitation. No use of accessory muscles of respiration.  CARDIOVASCULAR: S1, S2 normal. No murmurs, rubs, or gallops.  ABDOMEN: Soft, non-tender, non-distended. Bowel sounds present. No organomegaly or mass.  EXTREMITIES: No pedal edema, cyanosis, or clubbing.  NEUROLOGIC: moves all extremities well PSYCHIATRIC:  patient is alert and awake. Has dementia at baseline SKIN: per RN  DATA REVIEW:    CBC  Recent Labs  Lab 11/22/19 0507  WBC 15.4*  HGB 11.4*  HCT 34.8*  PLT 183    Chemistries  Recent Labs  Lab 11/21/19 1614 11/22/19 0507 11/26/19 0518  NA 141   < > 143  K 3.2*   < > 3.4*  CL 112*   < > 113*  CO2 22   < > 23  GLUCOSE 161*   < > 161*  BUN 59*   < > 29*  CREATININE 2.53*   < > 1.37*  CALCIUM 8.3*   < > 8.4*  AST 21  --   --   ALT 5  --   --   ALKPHOS 72  --   --   BILITOT 1.2  --   --    < > = values in this interval not displayed.    Microbiology Results   Recent Results (from the past 240 hour(s))  Blood Culture ID Panel (Reflexed)     Status: Abnormal   Collection Time: 11/21/19  4:08 PM  Result Value Ref Range Status   Enterococcus species NOT DETECTED NOT DETECTED Final   Listeria monocytogenes NOT DETECTED NOT DETECTED Final   Staphylococcus species DETECTED (A) NOT DETECTED Final    Comment: Methicillin (oxacillin) resistant coagulase negative staphylococcus. Possible blood culture contaminant (unless isolated from more than one blood culture draw or clinical case suggests pathogenicity). No antibiotic treatment is indicated for blood  culture contaminants. CRITICAL RESULT CALLED TO, READ BACK BY AND VERIFIED WITH: SHEEMA HALLAJI AT 0915 11/22/19 SDR    Staphylococcus aureus (BCID) NOT DETECTED NOT DETECTED Final   Methicillin resistance DETECTED (A) NOT DETECTED Final    Comment: CRITICAL RESULT CALLED TO, READ BACK BY AND VERIFIED WITH: SHEEMA HALLAJI AT 0915 11/22/19 SDR    Streptococcus species NOT DETECTED NOT DETECTED Final   Streptococcus agalactiae NOT DETECTED NOT DETECTED Final   Streptococcus pneumoniae NOT DETECTED NOT DETECTED Final   Streptococcus pyogenes NOT DETECTED NOT DETECTED Final   Acinetobacter baumannii NOT DETECTED NOT DETECTED Final   Enterobacteriaceae species NOT DETECTED NOT DETECTED Final   Enterobacter cloacae complex NOT DETECTED NOT DETECTED Final   Escherichia coli NOT DETECTED NOT DETECTED Final    Klebsiella oxytoca NOT DETECTED NOT DETECTED Final   Klebsiella pneumoniae NOT DETECTED NOT DETECTED Final   Proteus species NOT DETECTED NOT DETECTED Final   Serratia marcescens NOT DETECTED NOT DETECTED Final   Haemophilus influenzae NOT DETECTED NOT DETECTED Final   Neisseria meningitidis NOT DETECTED NOT DETECTED Final   Pseudomonas aeruginosa NOT DETECTED NOT DETECTED Final   Candida albicans NOT DETECTED NOT DETECTED Final   Candida glabrata NOT DETECTED NOT DETECTED Final   Candida krusei NOT DETECTED NOT DETECTED Final   Candida parapsilosis NOT DETECTED NOT DETECTED Final   Candida tropicalis NOT DETECTED NOT DETECTED Final    Comment: Performed at  [facility], [address] [place], [place] [address]  Respiratory Panel by RT PCR (Flu A&B, Covid) - Nasopharyngeal Swab     Status: None   Collection Time: 11/21/19  4:12 PM   Specimen: Nasopharyngeal Swab  Result Value Ref Range Status   SARS Coronavirus 2 by RT PCR NEGATIVE NEGATIVE Final    Comment: (NOTE) SARS-CoV-2 target nucleic acids are NOT DETECTED. The SARS-CoV-2 RNA is generally detectable in upper respiratoy specimens during the acute phase of infection. The lowest concentration of SARS-CoV-2 viral copies this assay can detect is 131 copies/mL. A negative result does not preclude SARS-Cov-2 infection and should not be used as the sole basis for treatment or other patient management decisions. A negative result may occur with  improper specimen collection/handling, submission of specimen other than nasopharyngeal swab, presence of viral mutation(s) within the areas targeted by this assay, and inadequate number of viral copies (<131 copies/mL). A negative result must be combined with clinical observations, patient history, and epidemiological information. The expected result is Negative. Fact Sheet for Patients:  PinkCheek.be Fact Sheet for Healthcare Providers:   GravelBags.it This test is not yet ap proved or cleared by the [place] FDA and  has been authorized for detection and/or diagnosis of SARS-CoV-2 by FDA under an Emergency Use Authorization (EUA). This EUA will remain  in effect (meaning this test can be used) for the duration of the COVID-19 declaration under Section 564(b)(1) of the Act, 21 U.S.C. section 360bbb-3(b)(1), unless the authorization is terminated or revoked sooner.    Influenza A by PCR NEGATIVE NEGATIVE Final   Influenza B by PCR NEGATIVE NEGATIVE Final    Comment: (NOTE) The Xpert Xpress SARS-CoV-2/FLU/RSV assay is intended as an aid in  the diagnosis of influenza from Nasopharyngeal swab specimens and  should not be used as a sole basis for treatment. Nasal washings and  aspirates are unacceptable for Xpert Xpress SARS-CoV-2/FLU/RSV  testing. Fact Sheet for Patients: PinkCheek.be Fact Sheet for Healthcare Providers: GravelBags.it This test is not yet approved or cleared by the [place] FDA and  has been authorized for detection and/or diagnosis of SARS-CoV-2 by  FDA under an Emergency Use Authorization (EUA). This EUA will remain  in effect (meaning this test can be used) for the duration of the  Covid-19 declaration under Section 564(b)(1) of the Act, 21  U.S.C. section 360bbb-3(b)(1), unless the authorization is  terminated or revoked. Performed at [facility], [address] [place], [place] [address]   Blood Culture (routine x 2)     Status: Abnormal   Collection Time: 11/21/19  4:14 PM   Specimen: BLOOD  Result Value Ref Range Status   Specimen Description   Final    BLOOD BLOOD RIGHT WRIST Performed at [facility], [address], [place], [place] [address]    Special Requests   Final    BOTTLES DRAWN AEROBIC AND ANAEROBIC Blood Culture adequate volume Performed at [facility], [address] [place], [place] [address]    Culture  Setup Time   Final    GRAM POSITIVE COCCI ANAEROBIC BOTTLE ONLY CRITICAL RESULT CALLED TO, READ BACK BY AND VERIFIED WITH: SHEEMA HALLAJI AT [id number] 11/22/19 SDR    Culture (A)  Final    STAPHYLOCOCCUS SPECIES (COAGULASE NEGATIVE) THE SIGNIFICANCE OF ISOLATING THIS ORGANISM FROM A SINGLE SET OF BLOOD CULTURES WHEN MULTIPLE SETS ARE DRAWN IS UNCERTAIN. PLEASE NOTIFY THE MICROBIOLOGY DEPARTMENT WITHIN ONE WEEK IF SPECIATION AND SENSITIVITIES  ARE REQUIRED. Performed at [facility], [place] [address] [place], [place] [address]    Report Status 11/24/2019 FINAL  Final  Blood Culture (routine x 2)     Status: Abnormal   Collection Time: 11/21/19  4:15 PM   Specimen: BLOOD  Result Value Ref Range Status   Specimen Description   Final    BLOOD BLOOD LEFT FOREARM Performed at [facility], [address] [place], [place] [address]    Special Requests   Final    BOTTLES DRAWN AEROBIC AND ANAEROBIC Blood Culture results may not be optimal due to an excessive volume of blood received in culture bottles Performed at [facility], [address] [place], [place] [address]    Culture  Setup Time   Final    GRAM NEGATIVE RODS IN BOTH AEROBIC AND ANAEROBIC BOTTLES CRITICAL RESULT CALLED TO, READ BACK BY AND VERIFIED WITH: DAVID BESANTI AT [id number] 11/22/19 SDR Performed at [facility], [place] [address], [place],  [address]    Culture ESCHERICHIA COLI (A)  Final   Report Status 11/24/2019 FINAL  Final   Organism ID, Bacteria ESCHERICHIA COLI  Final      Susceptibility   Escherichia coli - MIC*    AMPICILLIN >=32 RESISTANT Resistant     CEFAZOLIN 16 SENSITIVE Sensitive     CEFEPIME <=1 SENSITIVE Sensitive     CEFTAZIDIME <=1 SENSITIVE Sensitive     CEFTRIAXONE <=1 SENSITIVE Sensitive     CIPROFLOXACIN <=0.25 SENSITIVE Sensitive     GENTAMICIN <=1 SENSITIVE Sensitive     IMIPENEM <=0.25 SENSITIVE  Sensitive     TRIMETH/SULFA <=20 SENSITIVE Sensitive     AMPICILLIN/SULBACTAM >=32 RESISTANT Resistant     PIP/TAZO <=4 SENSITIVE Sensitive     * ESCHERICHIA COLI  Blood Culture ID Panel (Reflexed)     Status: Abnormal   Collection Time: 11/21/19  4:15 PM  Result Value Ref Range Status   Enterococcus species NOT DETECTED NOT DETECTED Final   Listeria monocytogenes NOT DETECTED NOT DETECTED Final   Staphylococcus species NOT DETECTED NOT DETECTED Final   Staphylococcus aureus (BCID) NOT DETECTED NOT DETECTED Final   Streptococcus species NOT DETECTED NOT DETECTED Final   Streptococcus agalactiae NOT DETECTED NOT DETECTED Final   Streptococcus pneumoniae NOT DETECTED NOT DETECTED Final   Streptococcus pyogenes NOT DETECTED NOT DETECTED Final   Acinetobacter baumannii NOT DETECTED NOT DETECTED Final   Enterobacteriaceae species DETECTED (A) NOT DETECTED Final    Comment: Enterobacteriaceae represent a large family of gram-negative bacteria, not a single organism. CRITICAL RESULT CALLED TO, READ BACK BY AND VERIFIED WITH:  DAVID BESANTI AT [id number] 11/22/19 SDR    Enterobacter cloacae complex NOT DETECTED NOT DETECTED Final   Escherichia coli DETECTED (A) NOT DETECTED Final    Comment: CRITICAL RESULT CALLED TO, READ BACK BY AND VERIFIED WITH:  DAVID BESANTI AT 0726 11/22/19 SDR    Klebsiella oxytoca NOT DETECTED NOT DETECTED Final   Klebsiella pneumoniae NOT DETECTED NOT DETECTED Final   Proteus species NOT DETECTED NOT DETECTED Final   Serratia marcescens NOT DETECTED NOT DETECTED Final   Carbapenem resistance NOT DETECTED NOT DETECTED Final   Haemophilus influenzae NOT DETECTED NOT DETECTED Final   Neisseria meningitidis NOT DETECTED NOT DETECTED Final   Pseudomonas aeruginosa NOT DETECTED NOT DETECTED Final   Candida albicans NOT DETECTED NOT DETECTED Final   Candida glabrata NOT DETECTED NOT DETECTED Final   Candida krusei NOT DETECTED NOT DETECTED  Final   Candida parapsilosis NOT  DETECTED NOT DETECTED Final   Candida tropicalis NOT DETECTED NOT DETECTED Final    Comment: Performed at [facility], [address] [place], [place] [address]    RADIOLOGY:  No results found.   CODE STATUS:     Code Status Orders  (From admission, onward)         Start     Ordered   11/21/19 2036  Do not attempt resuscitation (DNR)  Continuous    Question Answer Comment  In the event of cardiac or respiratory ARREST Do not call a "code blue"   In the event of cardiac or respiratory ARREST Do not perform Intubation, CPR, defibrillation or ACLS   In the event of cardiac or respiratory ARREST Use medication by any route, position, wound care, and other measures to relive pain and suffering. May use oxygen, suction and manual treatment of airway obstruction as needed for comfort.      11/21/19 2039        Code Status History    Date Active Date Inactive Code Status Order ID Comments User Context   05/29/2019 2132 06/04/2019 2212 Full Code [id number]  Orene Desanctis, DO ED   05/17/2019 2349 05/24/2019 2002 Full Code [id number]  Lance Coon, MD ED   05/13/2019 2308 05/16/2019 1711 Full Code [id number]  Lang Snow, NP ED   04/19/2017 1019 04/21/2017 1819 DNR [id number]  Loletha Grayer, MD ED   01/25/2017 1755 01/28/2017 1448 Full Code [id number]  Hillary Bow, MD ED   Advance Care Planning Activity       TOTAL TIME TAKING CARE OF THIS PATIENT: *40* minutes.    Fritzi Mandes M.D  [facility]    CC: Primary care physician; [name], MD

## 2019-11-27 NOTE — TOC Progression Note (Signed)
Transition of Care [facility]) - Progression Note    Patient Details  Name: Brandon Manning MRN: [id number] Date of Birth: 1945-12-26  [facility] CM/SW Contact  Su Hilt, RN Phone Number: 11/27/2019, 8:47 AM  Clinical Narrative:    Magda Paganini notified me that they have insurance approval, The patient will DC to room 592        Expected Discharge Plan and Services           Expected Discharge Date: 11/27/19                                     Social Determinants of Health (SDOH) Interventions    Readmission Risk Interventions No flowsheet data found.

## 2019-11-28 ENCOUNTER — Telehealth: Payer: Self-pay | Admitting: Neurology

## 2019-11-28 ENCOUNTER — Telehealth: Payer: Self-pay

## 2019-11-29 NOTE — Telephone Encounter (Signed)
Spoke with wife.  She expressed appreciation for care and for call.

## 2019-11-29 NOTE — Telephone Encounter (Signed)
Noted  

## 2019-12-11 ENCOUNTER — Inpatient Hospital Stay: Payer: Medicare HMO | Admitting: Family Medicine

## 2019-12-18 NOTE — Telephone Encounter (Signed)
Spoke with wife and expressed my condolences.  Wife was appreciative of our care and wanted Brandon Manning especially to know she was appreciative.

## 2019-12-18 NOTE — Telephone Encounter (Signed)
FYI

## 2019-12-18 NOTE — Telephone Encounter (Signed)
Cards are on your Desk

## 2019-12-18 NOTE — Telephone Encounter (Signed)
Patient's wife called in to let us know the patient passed away this morning. She wanted for me to let Dr. Carles Collet know he enjoyed having her as his doctor and never missed an appointment. She thanks Dr. Carles Collet for her care.

## 2019-12-18 NOTE — Telephone Encounter (Signed)
Patient's wife contacted the office and wanted [place] to be aware that patient passed away this morning around 4AM. Offered my condolences and advised I would make Dr. Darnell Level & Lattie Haw aware.

## 2019-12-18 DEATH — deceased

## 2019-12-26 ENCOUNTER — Ambulatory Visit: Payer: Medicare HMO | Admitting: Gastroenterology

## 2020-02-05 ENCOUNTER — Ambulatory Visit: Payer: Medicare HMO | Admitting: Neurology

## 2020-04-09 ENCOUNTER — Ambulatory Visit: Payer: Medicare HMO | Admitting: Internal Medicine

## 2020-04-30 ENCOUNTER — Ambulatory Visit: Payer: Medicare HMO | Admitting: Family Medicine
